# Patient Record
Sex: Male | Born: 1950
Health system: Southern US, Community
[De-identification: ages and names within clinical notes are randomized; demographics above are authoritative.]

## PROBLEM LIST (undated history)

## (undated) DIAGNOSIS — K859 Acute pancreatitis without necrosis or infection, unspecified: Secondary | ICD-10-CM

## (undated) DIAGNOSIS — K635 Polyp of colon: Secondary | ICD-10-CM

## (undated) DIAGNOSIS — N390 Urinary tract infection, site not specified: Secondary | ICD-10-CM

## (undated) DIAGNOSIS — IMO0001 Reserved for inherently not codable concepts without codable children: Secondary | ICD-10-CM

## (undated) DIAGNOSIS — K5790 Diverticulosis of intestine, part unspecified, without perforation or abscess without bleeding: Secondary | ICD-10-CM

## (undated) DIAGNOSIS — G4730 Sleep apnea, unspecified: Secondary | ICD-10-CM

## (undated) DIAGNOSIS — B9620 Unspecified Escherichia coli [E. coli] as the cause of diseases classified elsewhere: Secondary | ICD-10-CM

## (undated) DIAGNOSIS — R109 Unspecified abdominal pain: Secondary | ICD-10-CM

## (undated) HISTORY — DX: Acute pancreatitis without necrosis or infection, unspecified: K85.90

## (undated) HISTORY — DX: Reserved for inherently not codable concepts without codable children: IMO0001

## (undated) HISTORY — PX: COLONOSCOPY: SHX174

## (undated) HISTORY — PX: BACK SURGERY: SHX140

## (undated) HISTORY — DX: Polyp of colon: K63.5

## (undated) HISTORY — DX: Sleep apnea, unspecified: G47.30

## (undated) HISTORY — DX: Diverticulosis of intestine, part unspecified, without perforation or abscess without bleeding: K57.90

## (undated) HISTORY — DX: Unspecified abdominal pain: R10.9

## (undated) HISTORY — PX: HAND SURGERY: SHX662

## (undated) HISTORY — PX: KNEE SURGERY: SHX244

---

## 2004-06-13 ENCOUNTER — Emergency Department (HOSPITAL_COMMUNITY): Admission: EM | Admit: 2004-06-13 | Discharge: 2004-06-13 | Payer: Self-pay | Admitting: Emergency Medicine

## 2004-06-25 ENCOUNTER — Emergency Department (HOSPITAL_COMMUNITY): Admission: EM | Admit: 2004-06-25 | Discharge: 2004-06-25 | Payer: Self-pay | Admitting: Emergency Medicine

## 2004-06-26 ENCOUNTER — Ambulatory Visit (HOSPITAL_COMMUNITY): Admission: RE | Admit: 2004-06-26 | Discharge: 2004-06-26 | Payer: Self-pay | Admitting: Gastroenterology

## 2004-06-26 ENCOUNTER — Encounter (INDEPENDENT_AMBULATORY_CARE_PROVIDER_SITE_OTHER): Payer: Self-pay | Admitting: Specialist

## 2004-06-27 ENCOUNTER — Ambulatory Visit (HOSPITAL_COMMUNITY): Admission: RE | Admit: 2004-06-27 | Discharge: 2004-06-27 | Payer: Self-pay | Admitting: Gastroenterology

## 2004-07-01 ENCOUNTER — Encounter: Admission: RE | Admit: 2004-07-01 | Discharge: 2004-07-01 | Payer: Self-pay | Admitting: Gastroenterology

## 2004-09-12 ENCOUNTER — Encounter: Admission: RE | Admit: 2004-09-12 | Discharge: 2004-09-12 | Payer: Self-pay | Admitting: Gastroenterology

## 2007-12-20 ENCOUNTER — Emergency Department (HOSPITAL_COMMUNITY): Admission: EM | Admit: 2007-12-20 | Discharge: 2007-12-21 | Payer: Self-pay | Admitting: Emergency Medicine

## 2007-12-28 ENCOUNTER — Ambulatory Visit: Payer: Self-pay | Admitting: Gastroenterology

## 2007-12-28 ENCOUNTER — Encounter: Admission: RE | Admit: 2007-12-28 | Discharge: 2007-12-28 | Payer: Self-pay | Admitting: Gastroenterology

## 2007-12-28 LAB — CONVERTED CEMR LAB
Alkaline Phosphatase: 69 units/L (ref 39–117)
Bilirubin, Direct: 0.1 mg/dL (ref 0.0–0.3)
HCT: 48.3 % (ref 39.0–52.0)
Hemoglobin: 16.7 g/dL (ref 13.0–17.0)
Lipase: 119 units/L — ABNORMAL HIGH (ref 11.0–59.0)
Lymphocytes Relative: 29.6 % (ref 12.0–46.0)
MCHC: 34.6 g/dL (ref 30.0–36.0)
Monocytes Relative: 8.3 % (ref 3.0–11.0)
RBC: 5.24 M/uL (ref 4.22–5.81)
WBC: 10.3 10*3/uL (ref 4.5–10.5)

## 2008-01-03 ENCOUNTER — Ambulatory Visit: Payer: Self-pay | Admitting: Gastroenterology

## 2008-01-05 ENCOUNTER — Ambulatory Visit: Payer: Self-pay | Admitting: Gastroenterology

## 2008-01-05 LAB — CONVERTED CEMR LAB
Cholesterol: 246 mg/dL (ref 0–200)
Direct LDL: 172.2 mg/dL
HDL: 35.1 mg/dL — ABNORMAL LOW (ref >39.0)
Total CHOL/HDL Ratio: 7
Triglycerides: 151 mg/dL — ABNORMAL HIGH (ref 0–149)
VLDL: 30 mg/dL (ref 0–40)

## 2008-01-10 ENCOUNTER — Ambulatory Visit: Payer: Self-pay | Admitting: Cardiology

## 2008-01-18 ENCOUNTER — Ambulatory Visit: Payer: Self-pay | Admitting: Gastroenterology

## 2008-01-30 ENCOUNTER — Ambulatory Visit: Payer: Self-pay | Admitting: Gastroenterology

## 2008-01-30 ENCOUNTER — Encounter: Payer: Self-pay | Admitting: Gastroenterology

## 2008-01-30 DIAGNOSIS — K859 Acute pancreatitis without necrosis or infection, unspecified: Secondary | ICD-10-CM | POA: Insufficient documentation

## 2008-02-08 ENCOUNTER — Ambulatory Visit: Payer: Self-pay | Admitting: Gastroenterology

## 2008-02-09 ENCOUNTER — Ambulatory Visit: Payer: Self-pay | Admitting: Pulmonary Disease

## 2008-02-09 DIAGNOSIS — G4730 Sleep apnea, unspecified: Secondary | ICD-10-CM | POA: Insufficient documentation

## 2008-02-09 DIAGNOSIS — E669 Obesity, unspecified: Secondary | ICD-10-CM

## 2008-02-12 ENCOUNTER — Ambulatory Visit (HOSPITAL_BASED_OUTPATIENT_CLINIC_OR_DEPARTMENT_OTHER): Admission: RE | Admit: 2008-02-12 | Discharge: 2008-02-12 | Payer: Self-pay | Admitting: Pulmonary Disease

## 2008-02-12 ENCOUNTER — Encounter: Payer: Self-pay | Admitting: Pulmonary Disease

## 2008-02-16 ENCOUNTER — Ambulatory Visit: Payer: Self-pay | Admitting: Pulmonary Disease

## 2008-02-22 ENCOUNTER — Telehealth: Payer: Self-pay | Admitting: Pulmonary Disease

## 2008-03-01 ENCOUNTER — Ambulatory Visit: Payer: Self-pay | Admitting: Pulmonary Disease

## 2008-03-01 DIAGNOSIS — G2589 Other specified extrapyramidal and movement disorders: Secondary | ICD-10-CM

## 2008-03-08 ENCOUNTER — Telehealth (INDEPENDENT_AMBULATORY_CARE_PROVIDER_SITE_OTHER): Payer: Self-pay | Admitting: *Deleted

## 2008-03-08 ENCOUNTER — Encounter: Payer: Self-pay | Admitting: Pulmonary Disease

## 2008-10-24 ENCOUNTER — Ambulatory Visit (HOSPITAL_COMMUNITY): Admission: RE | Admit: 2008-10-24 | Discharge: 2008-10-24 | Payer: Self-pay | Admitting: *Deleted

## 2008-10-24 ENCOUNTER — Encounter (INDEPENDENT_AMBULATORY_CARE_PROVIDER_SITE_OTHER): Payer: Self-pay | Admitting: *Deleted

## 2011-01-11 ENCOUNTER — Encounter: Payer: Self-pay | Admitting: Gastroenterology

## 2011-05-05 NOTE — Op Note (Signed)
NAME:  Benjamin, Romero NO.:  [id number]   MEDICAL RECORD NO.:  [id number]          PATIENT TYPE:  AMB   LOCATION:  DAY                          FACILITY:  [facility]   PHYSICIAN:  Alfonse Ras, MD   DATE OF BIRTH:  [birth year]   DATE OF PROCEDURE:  DATE OF DISCHARGE:                               OPERATIVE REPORT   PREOPERATIVE DIAGNOSIS:  Posterior neck mass.   POSTOPERATIVE DIAGNOSIS:  Posterior neck mass.   PROCEDURE:  Excision, posterior neck mass.   ANESTHESIA:  MAC, local.   SURGEON:  Alfonse Ras, M.D.   DESCRIPTION:  After informed consent was granted for a posterior neck  mass for excision, the patient was taken to the operating room and  placed in right lateral decubitus position.  The posterior neck was  prepped and draped in normal sterile fashion.  Because of the patient's  body habitus, his neck was flexed, however, was still fairly difficult  to define subcutaneous fat from the palpable mass which was much easier  to feel in the upright position.  Using 1% lidocaine and local  anesthesia, the skin and subcutaneous tissue overlying the fundus was  anesthetized.  Using a transverse incision, I dissected down through the  skin and subcutaneous tissue onto what I felt was the slightly inflamed  mass.  This was not well encapsulated as I would have expected with a  lipoma or sebaceous cyst.  The area from this was excised with much  fatty material, involved most consistent with a lipoma.  Adequate  hemostasis of the cavity was insured.  Further palpation did not  delineate any masses.  The subcutaneous tissue was closed with a running  3-0 Vicryl.  Skin was closed with subcuticular 3-0 Monocryl.  Dermabond  dressing was placed.  The patient tolerated the procedure well went to  PACU in good condition.      Alfonse Ras, MD  Electronically Signed     KRE/MEDQ  D:  10/24/2008  T:  10/25/2008  Job:  [id number]

## 2011-05-05 NOTE — Procedures (Signed)
NAME:  Benjamin Romero, Benjamin Romero NO.:  [id number]   MEDICAL RECORD NO.:  [id number]          PATIENT TYPE:  OUT   LOCATION:  SLEEP CENTER                 FACILITY:  [facility]   PHYSICIAN:  Oretha Milch, MD      DATE OF BIRTH:  [birth year]   DATE OF STUDY:  02/12/2008                            NOCTURNAL POLYSOMNOGRAM   REFERRING PHYSICIAN:   INDICATION FOR STUDY:  Witnessed apneas by wife with loud snoring.  BMI  33.  Neck size 17 inches.  This nocturnal polysomnogram was performed  with the sleep technologist in attendance.  EEG, EOG, EMG, EKG, and  respiratory data was recorded.   EPWORTH SLEEPINESS SCORE:  1/24   MEDICATIONS:   SLEEP ARCHITECTURE:  Arousals and respiratory parameters were scored  according to data laid out by the American Academy of Sleep Medicine.  Lights out was at 10:25 p.m.  Lights on was at 3:48 a.m. after a  bathroom visit.  Total sleep time was 294.5 minutes with a sleep period  time of 310 minutes with a personal sleep efficiency of 91.3%.  Sleep  latency was 4.5 minutes and awake during sleep was 15.5 minutes.  Sleep  stages as a percentage of sleep time was N1 9.3%, N2 67.4%, N3 0%, and  REM sleep 23.3% (68.5 minutes).  He spent 95.5 minutes (32.4%) in supine  sleep and 21.5 minutes in supine REM sleep.  Good REM progression was  seen with the longest REM period around midnight.  REM latency was 56  minutes.  Arousal data; there were a total of 116 arousals of which 58  were spontaneous, 41 were associated with hypopneas, and 10 with apneas.  One arousal was associated with periodic limb movement.   RESPIRATORY DATA:  There were a total of 23 obstructive apneas, one  central apnea, 0 mixed apneas, and 94 hypopneas leading to an  apnea/hypopnea index of 24 events per hour and a respiratory disturbance  index of 32 events per hour with 39 areras.  In the supine position the  AHI was 43.3 events per hour, REM AHI was 42.9 events per hour and  REM  supine AHI was 72.6 events per hour.  The longest apnea duration was  30.1 seconds and the longest hypopnea duration was 53.7 seconds.   OXYGEN DATA:  The desaturation index was 27.3 events per hour.  The  lowest desaturation was 83%.  He spent 8.8 minutes with a saturation  less than 88%.   CARDIAC DATA:  The lowest heart rate was 34 beats per minute in REM  sleep and 35 beats per minute during non REM sleep.  The maximum heart  rate was 144 beats per minute in non REM sleep.  Occasional PVC's were  observed.   MOVEMENT-PARASOMNIA:  There were a total of 176 periodic limb movements  seen with a PLM index of 35.9 events per hour.  However, only one of  these movements was associated with an arousal.   DISCUSSION:  Moderate degree of respiratory disturbance with worsening  during supine and REM sleep.  He did not meet criteria for split study.  Frequent PLM's however  were not associated with arousals.   IMPRESSION:  1. Moderate degree of obstructive sleep apnea with hypopneas causing      sleep fragmentation and desaturations.  2. Significant periodic limb movements, however, these were not      associated with arousals.  3. No evidence of cardiac arrhythmias.   RECOMMENDATIONS:  1. The treatment options for this degree of obstructive sleep apnea      would include weight loss and continuous positive air pressure.  2. It would be interesting to see if CPAP therapy decreases the number      of periodic limb movements.  I would advise an inpatient CPAP      titration study to better observe this.      Oretha Milch, MD  Electronically Signed     RVA/MEDQ  D:  02/16/2008 18:24:31  T:  02/17/2008 12:50:20  Job:  [id number]

## 2011-05-05 NOTE — Assessment & Plan Note (Signed)
[facility]                         GASTROENTEROLOGY OFFICE NOTE   Benjamin Romero, Benjamin Romero                      MRN:          [id number]  DATE:12/28/2007                            DOB:          1951/11/06    GASTROENTEROLOGY CONSULTATION   REASON FOR CONSULTATION:  Acute pancreatitis.   HISTORY:  Benjamin Romero is a pleasant 60 year old African-American male  referred through the [facility] emergency room for evaluation.  On December 20, 2007 he was seen in the emergency room for acute abdominal pain.  He  had pain for approximately three to four days prior to that.  His work  up was pertinent for elevated amylase and lipase.  Laboratory work was  pertinent for a white count of 13.6, normal liver tests, amylase of 359  and lipase of 743.  Benjamin Romero continues to have upper epigastric pain  without radiation.  It is less than it was previously.  Symptoms began  when he was placed on doxycycline for a sinus infection.  He has been  taking Lipitor over the last couple of months.  History is noteworthy  for similar episodes of severe pain.  He was hospitalized in 2004 in [place].  He apparently underwent a full work up where no explicit  diagnosis was made.  He drinks only rarely.  An ultrasound on December  31st was unremarkable.  A CT scan showed mild distention of the  gallbladder with a small amount of pericholecystic fluid.  Ultrasound  did not demonstrate this latter finding.  He is on no other medications  or gastric irritants.   PAST MEDICAL HISTORY:  Pertinent for kidney stones and sleep apnea.   FAMILY HISTORY:  Noncontributory.   MEDICATIONS:  He currently is on no medications except for  hydromorphone.   ALLERGIES:  He has no allergies.   SOCIAL HISTORY:  He smokes 1 pack a day, he drinks rarely.  He is  married and unemployed.   REVIEW OF SYSTEMS:  Positive for urinary frequency, back pain, and  fatigue.  Positive for chronic  constipation.   PHYSICAL EXAMINATION:  VITAL SIGNS:  Pulse 80.  Blood pressure 134/82.  Weight 244.  HEENT  EOMI.  PERRLA.  Sclerae are anicteric.  Conjunctivae are pink.  NECK:  Supple without thyromegaly, adenopathy or carotid bruits.  CHEST:  Clear to auscultation and percussion without adventitious  sounds.  CARDIAC:  Regular rhythm; normal S1 S2.  There are no murmurs, gallops  or rubs.  ABDOMEN:  He has moderate tenderness in the mid epigastrium to light  palpation.  There is no guarding or rebound.  There are no abdominal  masses or organomegaly.  EXTREMITIES:  Full range of motion.  No cyanosis, clubbing or edema.  RECTAL:  Deferred.   IMPRESSION:  Acute pancreatitis.  By history, I believe he may have had  similar episodes in the past.  Etiology is unclear.  There is no current  evidence for biliary tract disease.  Alcohol is not a factor.  Microlithiasis remains a consideration.  Pancreatic divisum ought to be  ruled out.  Medications  are not contributory.  Finally, a posterior  penetrating ulcer ought to be ruled out as well.   RECOMMENDATION:  1. Repeat liver function tests, amylase and lipase.  2. MRCP.  3. If #2 is negative, I will proceed with upper endoscopy.  4. Begin Nexium 40 mg a day.     Barbette Hair. Arlyce Dice, MD,FACG  Electronically Signed    RDK/MedQ  DD: 12/28/2007  DT: 12/28/2007  Job #: [id number]

## 2011-05-05 NOTE — Assessment & Plan Note (Signed)
[facility]                         GASTROENTEROLOGY OFFICE NOTE   EULISES, KIJOWSKI                      MRN:          [id number]  DATE:02/08/2008                            DOB:          03/07/1951    PROBLEM:  Recurrent pancreatitis.  Mr. Karbowski has returned for scheduled  followup.  He has felt well since his last visit.  MRCP was unrevealing.  Upper endoscopy was normal.  Colonoscopy, there was 1 adenomatous polyp  and diffuse diverticulosis.  Mr. Stratmann claims that, he thinks certain  foods have caused his pancreatitis.  No obvious cause has been  determined.   His only medication is Lipitor 20 mg a day.   EXAM:  Pulse 80, blood pressure 120/80, weight 247.   IMPRESSION:  1. Recurrent acute pancreatitis.  He has had what sounds like 3      episodes.  The etiology is not clear.  2. Colon polyps.  3. Diverticulosis.  4. Sleep apnea.   RECOMMENDATIONS:  1. No further GI workup at this time.  I carefully instructed Mr.      Nasca to contact me if he has recurrent pain.  2. Followup colonoscopy in approximately 5 years.  3. Refer to pulmonary for sleep apnea clinic.     Barbette Hair. Arlyce Dice, MD,FACG  Electronically Signed    RDK/MedQ  DD: 02/08/2008  DT: 02/09/2008  Job #: [id number]

## 2011-05-05 NOTE — Letter (Signed)
December 28, 2007    [name]   RE:  [name], [name]  MRN:  [id number]  /  DOB:  March 10, 1951   Dear Mr. Moffatt:   It is my pleasure to have treated you recently as a new patient in my  office.  I appreciate your confidence and the opportunity to participate  in your care.   Since I do have a busy inpatient endoscopy schedule and office schedule,  my office hours vary weekly.  I am, however, available for emergency  calls every day through my office.  If I cannot promptly meet an urgent  office appointment, another one of our gastroenterologists will be able  to assist you.   My well-trained staff are prepared to help you at all times.  For  emergencies after office hours, a physician from our gastroenterology  section is always available through my 24-hour answering service.   While you are under my care, I encourage discussion of your questions  and concerns, and I will be happy to return your calls as soon as I am  available.   Once again, I welcome you as a new patient and I look forward to a happy  and healthy relationship.    Sincerely,      Barbette Hair. Arlyce Dice, MD,FACG  Electronically Signed   RDK/MedQ  DD: 12/28/2007  DT: 12/28/2007  Job #: [id number]

## 2011-05-08 NOTE — Op Note (Signed)
NAME:  Benjamin Romero, Benjamin Romero                         ACCOUNT NO.:  [id number]   MEDICAL RECORD NO.:  [id number]                   PATIENT TYPE:  AMB   LOCATION:  ENDO                                 FACILITY:  [facility]   PHYSICIAN:  Graylin Shiver, M.D.                DATE OF BIRTH:  [birth year]   DATE OF PROCEDURE:  06/26/2004  DATE OF DISCHARGE:                                 OPERATIVE REPORT   PROCEDURE PERFORMED:  Esophagogastroduodenoscopy with biopsy.   ENDOSCOPIST:  Graylin Shiver, M.D.   INDICATIONS FOR PROCEDURE:  The patient is a 60 year old male with continued  complaints of epigastric abdominal pain.  He went to the emergency room  yesterday with epigastric pain.  His liver enzymes were normal, lipase was  normal.  An abdominal ultrasound was normal.  The patient has been taking  PrevPack which he has been on recently since a prior visit to the emergency  room.  He was recently in the emergency room in [place] and then another  visit to [facility] Emergency Department with continued epigastric pain.  No  specific cause for the epigastric pain has been found.  An EGD is being done  today to further evaluate.   Informed consent was obtained after explanation of the risks of bleeding,  infection and perforation.   PREMEDICATIONS:  Fentanyl 50 mcg IV, Versed 5 mg IV.   DESCRIPTION OF PROCEDURE:  With the patient in the left lateral decubitus  position the Olympus gastroscope was inserted into the oropharynx and passed  into the esophagus.  It was advanced down the esophagus and into the stomach  and into the duodenum.  The second portion of the duodenum looked normal.  The bulb of the duodenum showed some erythema consistent with duodenitis.  The stomach showed a diffuse erythematous appearance to the mucosa  consistent with gastritis.  No ulcers, erosions, tumors or other  abnormalities were noted.  Biopsies were obtained from the distal stomach  for histological inspection and  to look for evidence of Helicobacter pylori.  No lesions were seen in the fundus or cardia on retroflexion.  The esophagus  revealed the esophagogastric junction to be at 40 cm.  It looked normal.  The esophageal mucosa looked normal.  The patient tolerated the procedure  well without complications.   IMPRESSION:  1. Gastritis, biopsies pending.  2. Duodenitis of the bulb.   PLAN:  Continue PrevPack.  Consider CT abdomen to further evaluate if pain  continues.                                               Graylin Shiver, M.D.    Germain Osgood  D:  06/26/2004  T:  06/26/2004  Job:  [id number]

## 2011-09-25 LAB — POCT I-STAT CREATININE
Creatinine, Ser: 1.1
Operator id: 270651

## 2011-09-25 LAB — CBC
HCT: 45.5
Hemoglobin: 15.6
MCHC: 34.3
MCV: 91.9
Platelets: 276
RBC: 4.94
RDW: 14.1
WBC: 13.6 — ABNORMAL HIGH

## 2011-09-25 LAB — DIFFERENTIAL
Basophils Absolute: 0
Basophils Relative: 0
Eosinophils Absolute: 0.3
Eosinophils Relative: 3
Lymphocytes Relative: 20
Lymphs Abs: 2.7
Monocytes Absolute: 1.3 — ABNORMAL HIGH
Monocytes Relative: 10
Neutro Abs: 9.2 — ABNORMAL HIGH
Neutrophils Relative %: 68

## 2011-09-25 LAB — I-STAT 8, (EC8 V) (CONVERTED LAB)
BUN: 10
Chloride: 105
HCT: 50
Hemoglobin: 17
Operator id: 270651
Potassium: 3.9
TCO2: 26
pH, Ven: 7.458 — ABNORMAL HIGH

## 2011-09-25 LAB — HEPATIC FUNCTION PANEL
Bilirubin, Direct: 0.1
Indirect Bilirubin: 0.7
Total Bilirubin: 0.8

## 2011-09-25 LAB — AMYLASE: Amylase: 359 — ABNORMAL HIGH

## 2011-09-25 LAB — LIPASE, BLOOD: Lipase: 743 — ABNORMAL HIGH

## 2011-11-03 DIAGNOSIS — K859 Acute pancreatitis without necrosis or infection, unspecified: Secondary | ICD-10-CM | POA: Insufficient documentation

## 2011-11-03 DIAGNOSIS — R109 Unspecified abdominal pain: Secondary | ICD-10-CM | POA: Insufficient documentation

## 2011-11-04 ENCOUNTER — Emergency Department (HOSPITAL_COMMUNITY): Payer: Medicare Other

## 2011-11-04 ENCOUNTER — Emergency Department (HOSPITAL_COMMUNITY)
Admission: EM | Admit: 2011-11-04 | Discharge: 2011-11-04 | Disposition: A | Discharge status: Home / Self Care | Payer: Medicare Other | Attending: Emergency Medicine | Admitting: Emergency Medicine

## 2011-11-04 ENCOUNTER — Encounter: Payer: Self-pay | Admitting: Emergency Medicine

## 2011-11-04 DIAGNOSIS — K859 Acute pancreatitis without necrosis or infection, unspecified: Secondary | ICD-10-CM

## 2011-11-04 LAB — URINALYSIS, ROUTINE W REFLEX MICROSCOPIC
Bilirubin Urine: NEGATIVE
Glucose, UA: NEGATIVE mg/dL
Ketones, ur: NEGATIVE mg/dL
Protein, ur: NEGATIVE mg/dL

## 2011-11-04 LAB — COMPREHENSIVE METABOLIC PANEL
ALT: 15 U/L (ref 0–53)
AST: 15 U/L (ref 0–37)
Alkaline Phosphatase: 72 U/L (ref 39–117)
CO2: 27 mEq/L (ref 19–32)
Chloride: 99 mEq/L (ref 96–112)
GFR calc non Af Amer: 75 mL/min — ABNORMAL LOW (ref >90)
Potassium: 4.6 mEq/L (ref 3.5–5.1)
Sodium: 136 mEq/L (ref 135–145)
Total Bilirubin: 0.4 mg/dL (ref 0.3–1.2)

## 2011-11-04 LAB — CBC
HCT: 45.7 % (ref 39.0–52.0)
MCV: 90.7 fL (ref 78.0–100.0)
RDW: 13.5 % (ref 11.5–15.5)
WBC: 12.9 10*3/uL — ABNORMAL HIGH (ref 4.0–10.5)

## 2011-11-04 LAB — DIFFERENTIAL
Basophils Absolute: 0.1 10*3/uL (ref 0.0–0.1)
Eosinophils Relative: 4 % (ref 0–5)
Lymphocytes Relative: 27 % (ref 12–46)
Monocytes Absolute: 1.1 10*3/uL — ABNORMAL HIGH (ref 0.1–1.0)

## 2011-11-04 MED ORDER — HYDROMORPHONE HCL PF 1 MG/ML IJ SOLN
0.5000 mg | Freq: Once | INTRAMUSCULAR | Status: AC
  Administered 2011-11-04: 0.5 mg via INTRAVENOUS
  Filled 2011-11-04: qty 1

## 2011-11-04 MED ORDER — PROMETHAZINE HCL 25 MG RE SUPP: 25.0000 mg | Freq: Four times a day (QID) | RECTAL | Status: AC | PRN

## 2011-11-04 MED ORDER — OXYCODONE HCL 5 MG PO TABS: ORAL_TABLET | ORAL | Status: DC

## 2011-11-04 MED ORDER — ONDANSETRON HCL 4 MG PO TABS: 4.0000 mg | ORAL_TABLET | Freq: Four times a day (QID) | ORAL | Status: AC

## 2011-11-04 MED ORDER — PROMETHAZINE HCL 25 MG PO TABS
25.0000 mg | ORAL_TABLET | Freq: Four times a day (QID) | ORAL | Status: AC | PRN
Start: 2011-11-04 — End: 2011-11-11

## 2011-11-04 NOTE — ED Notes (Signed)
Given ice chips per Dr Norlene Campbell.  Awaiting abd Ultrasound

## 2011-11-04 NOTE — ED Provider Notes (Signed)
History     CSN: [id number] Arrival date & time: 11/04/2011 12:17 AM   First MD Initiated Contact with Patient 11/04/11 0136      No chief complaint on file.   (Consider location/radiation/quality/duration/timing/severity/associated sxs/prior treatment) HPI 60 year old male presents to emergency department with complaint of 6 days of ongoing abdominal pain. Patient reports initially he had nausea vomiting and diarrhea which has resolved 4 days ago. Patient reports past history of pancreatitis 15 years ago and 4 years ago. Patient denies any alcohol use, no history of gallstone disease. Patient reports he was seen by Dr. Candelaria Stagers with GI and upper endoscopy with his previous episode of pancreatitis without known cause. Patient has had no fevers. Patient has been doing broth and Jell-O without improvement in pain. Patient was seen today at [facility] and had lab work done and was told that his white count was slightly elevated. History reviewed. No pertinent past medical history.  Past Surgical History  Procedure Date  . Back surgery   . Knee surgery     History reviewed. No pertinent family history.  History  Substance Use Topics  . Smoking status: Current Everyday Smoker -- 1.0 packs/day    Types: Cigarettes  . Smokeless tobacco: Never Used  . Alcohol Use: Yes     not often      Review of Systems  Constitutional: Negative.   HENT: Negative.   Eyes: Negative.   Respiratory: Negative.   Cardiovascular: Negative.   Gastrointestinal: Positive for abdominal pain.  Genitourinary: Negative.   Musculoskeletal: Negative.   Skin: Negative.   Neurological: Negative.   Hematological: Negative.   Psychiatric/Behavioral: Negative.   All other systems reviewed and are negative.    Allergies  Review of patient's allergies indicates no known allergies.  Home Medications   Current Outpatient Rx  Name Route Sig Dispense Refill  . ACETAMINOPHEN 325 MG PO TABS Oral Take 650 mg  by mouth every 6 (six) hours as needed. For pain     . OMEPRAZOLE 20 MG PO CPDR Oral Take 20 mg by mouth daily.        BP 160/87  Pulse 60  Temp(Src) 97.9 F (36.6 C) (Oral)  Resp 13  SpO2 99%  Physical Exam  ED Course  Procedures (including critical care time)  Labs Reviewed  COMPREHENSIVE METABOLIC PANEL - Abnormal; Notable for the following:    Glucose, Bld 115 (*)    GFR calc non Af Amer 75 (*)    GFR calc Af Amer 87 (*)    All other components within normal limits  LIPASE, BLOOD - Abnormal; Notable for the following:    Lipase 174 (*)    All other components within normal limits  URINALYSIS, ROUTINE W REFLEX MICROSCOPIC - Abnormal; Notable for the following:    Hgb urine dipstick SMALL (*)    All other components within normal limits  CBC - Abnormal; Notable for the following:    WBC 12.9 (*)    All other components within normal limits  DIFFERENTIAL - Abnormal; Notable for the following:    Neutro Abs 7.8 (*)    Monocytes Absolute 1.1 (*)    All other components within normal limits  URINE MICROSCOPIC-ADD ON    Results for orders placed during the hospital encounter of 11/04/11  COMPREHENSIVE METABOLIC PANEL      Component Value Range   Sodium 136  135 - 145 (mEq/L)   Potassium 4.6  3.5 - 5.1 (mEq/L)   Chloride 99  96 - 112 (mEq/L)   CO2 27  19 - 32 (mEq/L)   Glucose, Bld 115 (*) 70 - 99 (mg/dL)   BUN 13  6 - 23 (mg/dL)   Creatinine, Ser 4.09  0.50 - 1.35 (mg/dL)   Calcium 81.1  8.4 - 10.5 (mg/dL)   Total Protein 7.1  6.0 - 8.3 (g/dL)   Albumin 3.6  3.5 - 5.2 (g/dL)   AST 15  0 - 37 (U/L)   ALT 15  0 - 53 (U/L)   Alkaline Phosphatase 72  39 - 117 (U/L)   Total Bilirubin 0.4  0.3 - 1.2 (mg/dL)   GFR calc non Af Amer 75 (*) >90 (mL/min)   GFR calc Af Amer 87 (*) >90 (mL/min)  LIPASE, BLOOD      Component Value Range   Lipase 174 (*) 11 - 59 (U/L)  URINALYSIS, ROUTINE W REFLEX MICROSCOPIC      Component Value Range   Color, Urine YELLOW  YELLOW     Appearance CLEAR  CLEAR    Specific Gravity, Urine 1.018  1.005 - 1.030    pH 6.5  5.0 - 8.0    Glucose, UA NEGATIVE  NEGATIVE (mg/dL)   Hgb urine dipstick SMALL (*) NEGATIVE    Bilirubin Urine NEGATIVE  NEGATIVE    Ketones, ur NEGATIVE  NEGATIVE (mg/dL)   Protein, ur NEGATIVE  NEGATIVE (mg/dL)   Urobilinogen, UA 0.2  0.0 - 1.0 (mg/dL)   Nitrite NEGATIVE  NEGATIVE    Leukocytes, UA NEGATIVE  NEGATIVE   CBC      Component Value Range   WBC 12.9 (*) 4.0 - 10.5 (K/uL)   RBC 5.04  4.22 - 5.81 (MIL/uL)   Hemoglobin 16.0  13.0 - 17.0 (g/dL)   HCT 91.4  78.2 - 95.6 (%)   MCV 90.7  78.0 - 100.0 (fL)   MCH 31.7  26.0 - 34.0 (pg)   MCHC 35.0  30.0 - 36.0 (g/dL)   RDW 21.3  08.6 - 57.8 (%)   Platelets 279  150 - 400 (K/uL)  DIFFERENTIAL      Component Value Range   Neutrophils Relative 60  43 - 77 (%)   Neutro Abs 7.8 (*) 1.7 - 7.7 (K/uL)   Lymphocytes Relative 27  12 - 46 (%)   Lymphs Abs 3.5  0.7 - 4.0 (K/uL)   Monocytes Relative 9  3 - 12 (%)   Monocytes Absolute 1.1 (*) 0.1 - 1.0 (K/uL)   Eosinophils Relative 4  0 - 5 (%)   Eosinophils Absolute 0.5  0.0 - 0.7 (K/uL)   Basophils Relative 1  0 - 1 (%)   Basophils Absolute 0.1  0.0 - 0.1 (K/uL)  URINE MICROSCOPIC-ADD ON      Component Value Range   Squamous Epithelial / LPF RARE  RARE    WBC, UA 0-2  <3 (WBC/hpf)   RBC / HPF 0-2  <3 (RBC/hpf)   US Abdomen Complete  11/04/2011  *RADIOLOGY REPORT*  Clinical Data:  Abdominal pain, elevated lipase  COMPLETE ABDOMINAL ULTRASOUND  Comparison:  CT abdomen pelvis of 01/10/2008  Findings:  Gallbladder:  The gallbladder is visualized and no gallstones are noted.  Common bile duct:  The common bile duct is normal measuring 2.4 mm in diameter.  Liver:  The liver has a normal echogenic pattern.  No ductal dilatation is seen.  IVC:  Appears normal.  Pancreas:  The pancreatic head and body appear normal and the pancreatic duct  is not dilated.  The tail of the pancreas is obscured by bowel gas.   Spleen:  The spleen is normal measuring 5.6 cm sagittally.  Right Kidney:  No hydronephrosis is seen.  The right kidney measures 12.4 cm sagittally.  Left Kidney:  No hydronephrosis.  The left kidney measures 12.5 cm.  Abdominal aorta:  The abdominal aorta is within normal limits in caliber.  IMPRESSION: Negative abdominal ultrasound.  The tail the pancreas is obscured by bowel gas however.  Original Report Authenticated By: Juline Patch, M.D.      MDM  60 year old gentleman with pancreatitis. Patient does not want to be admitted at this time. Has history of same in the past. Reports no further vomiting or diarrhea. Hemodynamically stable without signs of cholelithiasis as cause of pancreatitis. Patient has a gastroenterologist he can followup with. Will send home with pain and nausea medicine with clear liquid diet and return for worsening condition        Olivia Mackie, MD 11/04/11 0730

## 2011-11-04 NOTE — ED Notes (Signed)
Taken to [place] via Doctor, general practice

## 2011-11-04 NOTE — ED Notes (Signed)
Remains asleep.  Awaiting Abd [place]

## 2011-11-04 NOTE — ED Notes (Signed)
Pt c/o upper mid abdominal pain onset 6 days ago.  Was seen by [facility] 11.13.12 and told WBC was elevated and abd xray was WNL.  .  Given prilosec without effect.  Rates abd pain  8/10 constant and sharp. Had NVD 6 days ago  But none since. Denies fever, chills, other pain, urinary symptoms.  Poor appetite on clear liquids

## 2011-11-04 NOTE — ED Notes (Signed)
Pt c/o mid abd pain onset approx 5 days ago.  Nausea without vomiting.  Had diarrhea 5 days ago, not now

## 2011-11-09 ENCOUNTER — Telehealth: Payer: Self-pay | Admitting: Gastroenterology

## 2011-11-09 NOTE — Telephone Encounter (Signed)
Left message to call back.  Pts daughter states her father had a bought with pancreatitis and was recently in the hospital. He was told to follow-up with his GI doc. Pt scheduled to see Dr. Arlyce Dice tomorrow at 2:45pm. Pts daughter aware of appt date and time.

## 2011-11-10 ENCOUNTER — Ambulatory Visit (INDEPENDENT_AMBULATORY_CARE_PROVIDER_SITE_OTHER): Payer: Medicare Other | Admitting: Gastroenterology

## 2011-11-10 ENCOUNTER — Encounter: Payer: Self-pay | Admitting: Gastroenterology

## 2011-11-10 ENCOUNTER — Ambulatory Visit (INDEPENDENT_AMBULATORY_CARE_PROVIDER_SITE_OTHER)
Admission: RE | Admit: 2011-11-10 | Discharge: 2011-11-10 | Disposition: A | Discharge status: Home / Self Care | Payer: Medicare Other | Source: Ambulatory Visit | Attending: Gastroenterology | Admitting: Gastroenterology

## 2011-11-10 ENCOUNTER — Other Ambulatory Visit (INDEPENDENT_AMBULATORY_CARE_PROVIDER_SITE_OTHER): Payer: Medicare Other

## 2011-11-10 ENCOUNTER — Other Ambulatory Visit: Payer: Self-pay | Admitting: Gastroenterology

## 2011-11-10 VITALS — BP 130/98 | HR 87 | Ht 72.0 in | Wt 226.4 lb

## 2011-11-10 DIAGNOSIS — K859 Acute pancreatitis without necrosis or infection, unspecified: Secondary | ICD-10-CM

## 2011-11-10 DIAGNOSIS — Z860100 Personal history of colon polyps, unspecified: Secondary | ICD-10-CM | POA: Insufficient documentation

## 2011-11-10 LAB — CBC WITH DIFFERENTIAL/PLATELET
Basophils Relative: 0.8 % (ref 0.0–3.0)
Eosinophils Relative: 1.7 % (ref 0.0–5.0)
HCT: 49.5 % (ref 39.0–52.0)
Lymphs Abs: 2.6 10*3/uL (ref 0.7–4.0)
MCV: 94 fl (ref 78.0–100.0)
Monocytes Absolute: 0.8 10*3/uL (ref 0.1–1.0)
Neutro Abs: 7.9 10*3/uL — ABNORMAL HIGH (ref 1.4–7.7)
RBC: 5.26 Mil/uL (ref 4.22–5.81)
WBC: 11.6 10*3/uL — ABNORMAL HIGH (ref 4.5–10.5)

## 2011-11-10 LAB — COMPREHENSIVE METABOLIC PANEL
Alkaline Phosphatase: 77 U/L (ref 39–117)
BUN: 12 mg/dL (ref 6–23)
Glucose, Bld: 95 mg/dL (ref 70–99)
Total Bilirubin: 0.7 mg/dL (ref 0.3–1.2)

## 2011-11-10 LAB — LIPID PANEL
Cholesterol: 208 mg/dL — ABNORMAL HIGH (ref 0–200)
HDL: 35.5 mg/dL — ABNORMAL LOW (ref >39.00)
VLDL: 30.2 mg/dL (ref 0.0–40.0)

## 2011-11-10 LAB — AMYLASE: Amylase: 49 U/L (ref 27–131)

## 2011-11-10 MED ORDER — IOHEXOL 300 MG/ML  SOLN
100.0000 mL | Freq: Once | INTRAMUSCULAR | Status: AC | PRN
  Administered 2011-11-10: 100 mL via INTRAVENOUS

## 2011-11-10 MED ORDER — HYDROCODONE-ACETAMINOPHEN 5-325 MG PO TABS: 2.0000 | ORAL_TABLET | Freq: Four times a day (QID) | ORAL | Status: DC | PRN

## 2011-11-10 NOTE — Assessment & Plan Note (Addendum)
Patient has chemical evidence of acute pancreatitis from one week ago. He has ongoing pain which likely is due to ongoing inflammation. Complications including pancreatic pseudocyst should be ruled out.  Etiology remains unclear though he was a heavy drinker over 30 years ago and may have chronic pancreatitis.  Recommendations #1 repeat amylase, lipase, LFTs, CBC and check IgG 4 level #2 I offered to admit the patient for parenteral analgesics but he wishes to try to continue as an outpatient for now.

## 2011-11-10 NOTE — Progress Notes (Signed)
History of Present Illness:  Benjamin Romero is a 60 year old Afro-American male self-referred for evaluation of abdominal pain. He has a history of recurrent pancreatitis. He was last seen in 2008 for symptomatic pancreatitis. Workup included comprehensive metabolic profile, CT and MRCP. Etiology was not determined. He's had prior episodes of pancreatitis before 2008. He was doing well until about 8 days ago when he developed severe midepigastric pain. He was seen at [facility] and in the ER. Abdominal ultrasound was unrevealing. Lab was pertinent for lipase 174. Despite the medication and reducing to liquids pain has continued. It is rated 5/10 with pain medicines and 8-9/10 when he is off medicines. He drank heavily until 30 years ago.  He is on no rate on medications.  GI history is also pertinent for adenomatous polyps diagnosed in 2009.   Past Medical History  Diagnosis Date  . Acute pancreatitis   . Colon polyps   . Diverticulosis   . Sleep apnea   . Pancreatitis    Past Surgical History  Procedure Date  . Back surgery   . Knee surgery    family history includes Thyroid cancer in his mother. Current Outpatient Prescriptions  Medication Sig Dispense Refill  . acetaminophen (TYLENOL) 325 MG tablet Take 650 mg by mouth every 6 (six) hours as needed. For pain       . ondansetron (ZOFRAN) 4 MG tablet Take 1 tablet (4 mg total) by mouth every 6 (six) hours.  12 tablet  0  . oxyCODONE (ROXICODONE) 5 MG immediate release tablet Take 1-2 tab po q 4 hours prn pain   20 tablet  0  . omeprazole (PRILOSEC) 20 MG capsule Take 20 mg by mouth daily.        . promethazine (PHENERGAN) 25 MG suppository Place 1 suppository (25 mg total) rectally every 6 (six) hours as needed for nausea.  12 each  0  . promethazine (PHENERGAN) 25 MG tablet Take 1 tablet (25 mg total) by mouth every 6 (six) hours as needed for nausea.  30 tablet  0   Allergies as of 11/10/2011  . (No Known Allergies)    reports that he  has been smoking Cigarettes.  He has been smoking about 1 pack per day. He has never used smokeless tobacco. He reports that he drinks alcohol. He reports that he does not use illicit drugs.     Review of Systems: Pertinent positive and negative review of systems were noted in the above HPI section. All other review of systems were otherwise negative.  Vital signs were reviewed in today's medical record Physical Exam: General: Well developed , well nourished, no acute distress Head: Normocephalic and atraumatic Eyes:  sclerae anicteric, EOMI Ears: Normal auditory acuity Mouth: No deformity or lesions Neck: Supple, no masses or thyromegaly Lungs: Clear throughout to auscultation Heart: Regular rate and rhythm; no murmurs, rubs or bruits Abdomen: Soft,  and non distended. No masses, hepatosplenomegaly or hernias noted. Normal Bowel sounds. There is moderate tenderness to palpation in the midepigastrium Rectal:deferred Musculoskeletal: Symmetrical with no gross deformities  Skin: No lesions on visible extremities Pulses:  Normal pulses noted Extremities: No clubbing, cyanosis, edema or deformities noted Neurological: Alert oriented x 4, grossly nonfocal Cervical Nodes:  No significant cervical adenopathy Inguinal Nodes: No significant inguinal adenopathy Psychological:  Alert and cooperative. Normal mood and affect

## 2011-11-10 NOTE — Patient Instructions (Signed)
  You have been scheduled for a CT scan of the abdomen and pelvis at [facility] ([address]---this is in the same building as [facility]).   You are scheduled on 11/10/2011 at 4:00. You should arrive 15 minutes prior to your appointment time for registration. Please follow the written instructions below on the day of your exam: GO THERE NOW AND DRINK YOUR 2ND BOTTLE OF CONTRAST GO TO THE BASEMENT FOR LABS FIRST  WARNING: IF YOU ARE ALLERGIC TO IODINE/X-RAY DYE, PLEASE NOTIFY RADIOLOGY IMMEDIATELY AT [phone]! YOU WILL BE GIVEN A 13 HOUR PREMEDICATION PREP.   2) You have been given 2 bottles of oral contrast to drink. The solution may taste               better if refrigerated, but do NOT add ice or any other liquid to this solution. Shake             well before drinking.    Drink 1 bottle of contrast @NOW  (2 hours prior to your exam)  Drink 1 bottle of contrast @ 3:45 (1 hour prior to your exam)  You may take any medications as prescribed with a small amount of water except for the following: Metformin, Glucophage, Glucovance, Avandamet, Riomet, Fortamet, Actoplus Met, Janumet, Glumetza or Metaglip. The above medications must be held the day of the exam AND 48 hours after the exam.  The purpose of you drinking the oral contrast is to aid in the visualization of your intestinal tract. The contrast solution may cause some diarrhea. Before your exam is started, you will be given a small amount of fluid to drink. Depending on your individual set of symptoms, you may also receive an intravenous injection of x-ray contrast/dye. Plan on being at [facility] for 30 minutes or long, depending on the type of exam you are having performed.  If you have any questions regarding your exam or if you need to reschedule, you may call the CT department at [phone] between the hours of 8:00 am and 5:00 pm,  Monday-Friday.  ________________________________________________________________________

## 2011-11-10 NOTE — Assessment & Plan Note (Signed)
Plan followup colonoscopy 2014 

## 2011-11-11 ENCOUNTER — Telehealth: Payer: Self-pay

## 2011-11-11 LAB — IGG 1, 2, 3, AND 4
IgG Subclass 2: 192 mg/dL (ref 169.0–786.0)
IgG Subclass 4: 37.7 mg/dL (ref 3.0–201.0)
IgG Total IGGSUB: 896 mg/dL (ref 650–1600)

## 2011-11-11 LAB — LDL CHOLESTEROL, DIRECT: Direct LDL: 159 mg/dL

## 2011-11-11 NOTE — Telephone Encounter (Signed)
Message copied by Michele Mcalpine on Wed Nov 11, 2011 11:02 AM ------      Message from: Melvia Heaps D      Created: Wed Nov 11, 2011 10:59 AM      Regarding: RE: CT RESULTS       Needs return OV next week.      ----- Message -----         From: Lily Lovings, RN         Sent: 11/11/2011   9:26 AM           To: Louis Meckel, MD      Subject: CT RESULTS                                               Pt had CT yesterday evening...Marland KitchenMarland KitchenResults show mild acute pancreatitis.            Please advise.

## 2011-11-11 NOTE — Progress Notes (Signed)
Quick Note:  Please schedule OV for next week. CT shows mild pancreatitis only ______

## 2011-11-11 NOTE — Telephone Encounter (Signed)
Pt scheduled to see Dr. Arlyce Dice 11/18/11@9 :30am. Pt aware of appt date and time.

## 2011-11-18 ENCOUNTER — Encounter: Payer: Self-pay | Admitting: Gastroenterology

## 2011-11-18 ENCOUNTER — Telehealth: Payer: Self-pay | Admitting: Gastroenterology

## 2011-11-18 ENCOUNTER — Ambulatory Visit (INDEPENDENT_AMBULATORY_CARE_PROVIDER_SITE_OTHER): Payer: Medicare Other | Admitting: Gastroenterology

## 2011-11-18 VITALS — BP 128/72 | HR 90 | Wt 222.6 lb

## 2011-11-18 DIAGNOSIS — K859 Acute pancreatitis without necrosis or infection, unspecified: Secondary | ICD-10-CM

## 2011-11-18 MED ORDER — PANCRELIPASE (LIP-PROT-AMYL) 25000 UNITS PO CPEP: 1.0000 | ORAL_CAPSULE | Freq: Three times a day (TID) | ORAL | Status: DC

## 2011-11-18 MED ORDER — OXYCODONE HCL 10 MG PO TB12: 10.0000 mg | ORAL_TABLET | Freq: Three times a day (TID) | ORAL | Status: DC | PRN

## 2011-11-18 MED ORDER — PANCRELIPASE (LIP-PROT-AMYL) 25000 UNITS PO CPEP: 2.0000 | ORAL_CAPSULE | Freq: Three times a day (TID) | ORAL | Status: DC

## 2011-11-18 NOTE — Telephone Encounter (Signed)
Pt called and states that he cannot afford the rx for Pancrelipase. Pt wants to know if there is something else that can be sent in for him that is less expensive.

## 2011-11-18 NOTE — Assessment & Plan Note (Addendum)
He continues to have pain. Etiology is unclear.  Recommendations #1 endoscopic ultrasound. This will help determine whether there are changes consistent with chronic pancreatitis and to rule out choledocholithiasis.  #2 trial of pancreatic enzyme supplementation

## 2011-11-18 NOTE — Progress Notes (Signed)
History of Present Illness:  Benjamin Romero has returned for followup of pancreatitis. CT scan which i reviewed,  showed mild inflammation of the pancreas. No other significant findings were seen. Amylase,  Lipase and LFTs were normal. He continues to complain of moderately severe pain, especially postprandially.    Review of Systems: Pertinent positive and negative review of systems were noted in the above HPI section. All other review of systems were otherwise negative.    Current Medications, Allergies, Past Medical History, Past Surgical History, Family History and Social History were reviewed in [employer] electronic medical record  Vital signs were reviewed in today's medical record. Physical Exam: General: Well developed , well nourished, no acute distress

## 2011-11-18 NOTE — Telephone Encounter (Signed)
Meds give him some samples for a week's worth of medication taking 2 tabs with each meal

## 2011-11-18 NOTE — Patient Instructions (Addendum)
Take  Zenpep two tablets by mouth with each meal, samples given and rx sent.  Rx for Oxycotin printed and given to you, please take to your pharmacy.

## 2011-11-19 MED ORDER — PANCRELIPASE (LIP-PROT-AMYL) 25000 UNITS PO CPEP: 2.0000 | ORAL_CAPSULE | Freq: Three times a day (TID) | ORAL | Status: DC

## 2011-11-19 NOTE — Telephone Encounter (Signed)
Samples left up front for pt to pick up. 

## 2011-11-24 ENCOUNTER — Telehealth: Payer: Self-pay

## 2011-11-24 ENCOUNTER — Telehealth: Payer: Self-pay | Admitting: Gastroenterology

## 2011-11-24 ENCOUNTER — Other Ambulatory Visit: Payer: Self-pay | Admitting: Gastroenterology

## 2011-11-24 DIAGNOSIS — K859 Acute pancreatitis without necrosis or infection, unspecified: Secondary | ICD-10-CM

## 2011-11-24 NOTE — Telephone Encounter (Signed)
Pt called and wants to know when we can schedule him for an endoscopic ultrasound. Dr. Arlyce Dice please advise.

## 2011-11-24 NOTE — Telephone Encounter (Signed)
Pt aware and has been instructed and meds reviewed.  Instructions also mailed to the pt

## 2011-11-24 NOTE — Telephone Encounter (Signed)
See alternate note  

## 2011-11-24 NOTE — Telephone Encounter (Signed)
Patty, I just sent you a note about this

## 2011-11-24 NOTE — Telephone Encounter (Signed)
Please schedule EUS with Dr. Christella Hartigan

## 2011-11-24 NOTE — Telephone Encounter (Signed)
Patty this pt needs to be scheduled for an EUS per Dr. Arlyce Dice.

## 2011-11-24 NOTE — Telephone Encounter (Signed)
Message copied by Donata Duff on Tue Nov 24, 2011 12:58 PM ------      Message from: Rachael Fee      Created: Tue Nov 24, 2011 12:48 PM       Acxel Dingee,      Can you put him on for upper EUS, radial +/- Linear for recurrent acute pancreaitits.  Needs propofol (has been on narcotics) for sedation.  Next available EUS time with propofol            Rob,      We'll get him scheduled. Thanks                  dj            ----- Message -----         From: Louis Meckel, MD         Sent: 11/24/2011  11:19 AM           To: Rob Bunting, MD, Lily Lovings, RN            Dan,      This patient has had recurrent pancreatitis of unknown etiology. I thought he EUS would  be helpful to determine whether there are  changes consistent with chronic pancreatitis (pt does not drink)  and to rule out small bile duct stones.

## 2011-11-27 ENCOUNTER — Other Ambulatory Visit: Payer: Medicare Other | Admitting: Gastroenterology

## 2011-12-09 ENCOUNTER — Telehealth: Payer: Self-pay | Admitting: Gastroenterology

## 2011-12-09 MED ORDER — HYDROCODONE-ACETAMINOPHEN 5-500 MG PO TABS: 1.0000 | ORAL_TABLET | Freq: Four times a day (QID) | ORAL | Status: DC | PRN

## 2011-12-09 NOTE — Telephone Encounter (Signed)
Pt aware and rx sent to pharmacy. 

## 2011-12-09 NOTE — Telephone Encounter (Signed)
OK for Hydrocodone 5/500, one po q6h prn, #30, NO refills

## 2011-12-09 NOTE — Telephone Encounter (Signed)
Pts wife calling stating that Dr. Arlyce Dice wrote a prescription for her husband for Oxycontin 10mg  #30 take 1 po tid on 11/18/11 for pancreatitis. The rx is going to cost them over 80.00 to fill and it is to expensive. Pts wife wants to know if we can give them a prescription for something less strong that isn't quite so expensive such as hydrocodone. Dr. Russella Dar as doc of the day please advise.

## 2011-12-31 ENCOUNTER — Ambulatory Visit (HOSPITAL_COMMUNITY)
Admission: RE | Admit: 2011-12-31 | Discharge: 2011-12-31 | Disposition: A | Discharge status: Home / Self Care | Payer: Medicare Other | Source: Ambulatory Visit | Attending: Gastroenterology | Admitting: Gastroenterology

## 2011-12-31 ENCOUNTER — Telehealth: Payer: Self-pay | Admitting: *Deleted

## 2011-12-31 ENCOUNTER — Encounter (HOSPITAL_COMMUNITY): Payer: Self-pay | Admitting: Anesthesiology

## 2011-12-31 ENCOUNTER — Encounter (HOSPITAL_COMMUNITY)
Admission: RE | Disposition: A | Discharge status: Home / Self Care | Payer: Self-pay | Source: Ambulatory Visit | Attending: Gastroenterology

## 2011-12-31 ENCOUNTER — Encounter (HOSPITAL_COMMUNITY): Payer: Self-pay | Admitting: Gastroenterology

## 2011-12-31 ENCOUNTER — Encounter (HOSPITAL_COMMUNITY): Payer: Self-pay | Admitting: *Deleted

## 2011-12-31 ENCOUNTER — Other Ambulatory Visit: Payer: Self-pay | Admitting: Gastroenterology

## 2011-12-31 ENCOUNTER — Ambulatory Visit (HOSPITAL_COMMUNITY): Payer: Medicare Other | Admitting: Anesthesiology

## 2011-12-31 DIAGNOSIS — G4730 Sleep apnea, unspecified: Secondary | ICD-10-CM | POA: Insufficient documentation

## 2011-12-31 DIAGNOSIS — K2990 Gastroduodenitis, unspecified, without bleeding: Secondary | ICD-10-CM

## 2011-12-31 DIAGNOSIS — K859 Acute pancreatitis without necrosis or infection, unspecified: Secondary | ICD-10-CM | POA: Insufficient documentation

## 2011-12-31 DIAGNOSIS — Z860100 Personal history of colon polyps, unspecified: Secondary | ICD-10-CM | POA: Insufficient documentation

## 2011-12-31 DIAGNOSIS — K2970 Gastritis, unspecified, without bleeding: Secondary | ICD-10-CM

## 2011-12-31 DIAGNOSIS — R933 Abnormal findings on diagnostic imaging of other parts of digestive tract: Secondary | ICD-10-CM

## 2011-12-31 DIAGNOSIS — K2980 Duodenitis without bleeding: Secondary | ICD-10-CM

## 2011-12-31 HISTORY — PX: EUS: SHX5427

## 2011-12-31 SURGERY — UPPER ENDOSCOPIC ULTRASOUND (EUS) LINEAR
Anesthesia: Monitor Anesthesia Care

## 2011-12-31 MED ORDER — LACTATED RINGERS IV SOLN
INTRAVENOUS | Status: DC
  Administered 2011-12-31: 1000 mL via INTRAVENOUS

## 2011-12-31 MED ORDER — PROPOFOL 10 MG/ML IV EMUL
INTRAVENOUS | Status: DC | PRN
  Administered 2011-12-31: 140 ug/kg/min via INTRAVENOUS

## 2011-12-31 MED ORDER — PROMETHAZINE HCL 25 MG/ML IJ SOLN: 6.2500 mg | INTRAMUSCULAR | Status: DC | PRN

## 2011-12-31 MED ORDER — BUTAMBEN-TETRACAINE-BENZOCAINE 2-2-14 % EX AERO
INHALATION_SPRAY | CUTANEOUS | Status: DC | PRN
  Administered 2011-12-31: 2 via TOPICAL

## 2011-12-31 MED ORDER — FENTANYL CITRATE 0.05 MG/ML IJ SOLN
INTRAMUSCULAR | Status: DC | PRN
  Administered 2011-12-31 (×2): 50 ug via INTRAVENOUS

## 2011-12-31 MED ORDER — MIDAZOLAM HCL 5 MG/5ML IJ SOLN
INTRAMUSCULAR | Status: DC | PRN
  Administered 2011-12-31 (×2): 1 mg via INTRAVENOUS

## 2011-12-31 MED ORDER — FENTANYL CITRATE 0.05 MG/ML IJ SOLN: 25.0000 ug | INTRAMUSCULAR | Status: DC | PRN

## 2011-12-31 NOTE — Telephone Encounter (Signed)
Message copied by Marlowe Kays on Thu Dec 31, 2011 11:21 AM ------      Message from: Melvia Heaps D      Created: Thu Dec 31, 2011  9:51 AM       The patient needs an office visit

## 2011-12-31 NOTE — Anesthesia Preprocedure Evaluation (Signed)
Anesthesia Evaluation  Patient identified by MRN, date of birth, ID band Patient awake    Reviewed: Allergy & Precautions, H&P , NPO status , Patient's Chart, lab work & pertinent test results  Airway Mallampati: II TM Distance: >3 FB Neck ROM: full    Dental No notable dental hx. (+) Chipped and Dental Advidsory Given   Pulmonary neg pulmonary ROS, sleep apnea (Noncompliant with CPAP) , former smoker (1 1/2ppd; discontinued 12/22/11) clear to auscultation  Pulmonary exam normal       Cardiovascular Exercise Tolerance: Good neg cardio ROS regular Normal    Neuro/Psych Negative Neurological ROS  Negative Psych ROS   GI/Hepatic negative GI ROS, Neg liver ROS,   Endo/Other  Negative Endocrine ROS  Renal/GU negative Renal ROS  Genitourinary negative   Musculoskeletal   Abdominal   Peds  Hematology negative hematology ROS (+)   Anesthesia Other Findings   Reproductive/Obstetrics negative OB ROS                           Anesthesia Physical Anesthesia Plan  ASA: II  Anesthesia Plan:    Post-op Pain Management:    Induction:   Airway Management Planned:   Additional Equipment:   Intra-op Plan:   Post-operative Plan:   Informed Consent: I have reviewed the patients History and Physical, chart, labs and discussed the procedure including the risks, benefits and alternatives for the proposed anesthesia with the patient or authorized representative who has indicated his/her understanding and acceptance.   Dental Advisory Given  Plan Discussed with: CRNA  Anesthesia Plan Comments:         Anesthesia Quick Evaluation

## 2011-12-31 NOTE — Op Note (Addendum)
[facility] [address] [place], [place]  [address]  ENDOSCOPIC ULTRASOUND PROCEDURE REPORT  PATIENT:  Benjamin Benjamin Romero, Benjamin Romero  MR#:  [id number] BIRTHDATE:  [birth year]  GENDER:  male  ENDOSCOPIST:  Rachael Fee, MD REFERRED BY:  Barbette Hair. Arlyce Dice, M.D.  PROCEDURE DATE:  12/31/2011 PROCEDURE:  Upper EUS ASA CLASS:  Class II INDICATIONS:  recurrent acute pancreatitis (20 years ago in [place], 5-6 years ago in [place], 2-3 months ago [place])  MEDICATIONS:   MAC sedation, administered by CRNA  DESCRIPTION OF PROCEDURE:   After the risks benefits and alternatives of the procedure were  explained, informed consent was obtained. The patient was then placed in the left, lateral, decubitus postion and IV sedation was administered. Throughout the procedure, the patient's blood pressure, pulse and oxygen saturations were monitored continuously.  Under direct visualization, the  endoscope was introduced through the mouth and advanced to the second portion of the duodenum.  Water was used as necessary to provide an acoustic interface.  Upon completion of the imaging, water was removed and the patient was sent to the recovery room in satisfactory condition.  Several endoscopic and ultrasonographic pictures were taken but were lost due to technical difficulty in endoscopy  Endoscopic findings: 1. Normal esophagus 2. Mild, non-specific gastritis.  This was biopsied with forceps following EUS examination given his history of H. pylori 3. Duodentitis (several small bulbar erosions)  EUS findings: 1. Pancreatic parenchyma was abnormal throughout: there were hyperechoc strands, overall lobular parnenchyma that are suggestive of chronic pancreatitis. 2. Normal, non-dilated main pancreatitic duct 3. No peripancreatic adenopathy 4. CBD was normal, non-dilated and without stones 5. Gallbladder was normal.  Impression: Parenchymal findings that are suggestive but not clearly diagnostic of  chronic pancreatitis.  Should consider elective cholecystectomy on chance that microtlithiasis has caused his 2-3 episodes of acute pancreatitis (other workup has been negative and he does not drink alcohol). There was gastritis  and duodenitis and given his history of H. pylori I biopsied the stomach to check for recurrent or residual bacteria  ______________________________ Rachael Fee, MD  n. REVISED:  12/31/2011 09:39 AM eSIGNED:   Rachael Fee at 12/31/2011 09:39 AM  [name], [phone]

## 2011-12-31 NOTE — H&P (Signed)
  HPI: This is a man with history of recurrent pancreatitis of unclear etiology although his used to drink a good amount of etoh.    Past Medical History  Diagnosis Date  . Acute pancreatitis   . Colon polyps   . Diverticulosis   . Sleep apnea   . Pancreatitis     Past Surgical History  Procedure Date  . Back surgery   . Knee surgery     No current facility-administered medications for this encounter.    Allergies as of 11/24/2011  . (No Known Allergies)    Family History  Problem Relation Age of Onset  . Thyroid cancer Mother     History   Social History  . Marital Status: Married    Spouse Name: N/A    Number of Children: 2  . Years of Education: N/A   Occupational History  . Not on file.   Social History Main Topics  . Smoking status: Former Smoker -- 1.0 packs/day    Types: Cigarettes    Quit date: 12/22/2011  . Smokeless tobacco: Never Used  . Alcohol Use: Yes     not often  . Drug Use: No  . Sexually Active:    Other Topics Concern  . Not on file   Social History Narrative  . No narrative on file      Physical Exam: BP 141/92  Temp(Src) 97.7 F (36.5 C) (Oral)  Resp 12  Ht 6' (1.829 m)  Wt 105.235 kg (232 lb)  BMI 31.47 kg/m2  SpO2 100% Constitutional: generally well-appearing Psychiatric: alert and oriented x3 Abdomen: soft, nontender, nondistended, no obvious ascites, no peritoneal signs, normal bowel sounds     Assessment and plan: 61 y.o. male with recurrent pancreatitis  For upper EUS today

## 2011-12-31 NOTE — Transfer of Care (Signed)
Immediate Anesthesia Transfer of Care Note  Patient: Benjamin Romero  Procedure(s) Performed:  UPPER ENDOSCOPIC ULTRASOUND (EUS) LINEAR - radial linear  Patient Location: PACU  Anesthesia Type: MAC  Level of Consciousness: awake, sedated and responds to stimulation  Airway & Oxygen Therapy: Patient Spontanous Breathing and Patient connected to nasal cannula oxygen  Post-op Assessment: Report given to PACU RN and Post -op Vital signs reviewed and stable  Post vital signs: stable  Complications: No apparent anesthesia complications

## 2012-01-01 ENCOUNTER — Encounter (HOSPITAL_COMMUNITY): Payer: Self-pay | Admitting: Gastroenterology

## 2012-01-01 NOTE — Anesthesia Postprocedure Evaluation (Signed)
Anesthesia Post Note  Patient: Benjamin Romero  Procedure(s) Performed:  UPPER ENDOSCOPIC ULTRASOUND (EUS) LINEAR - radial linear  Anesthesia type: MAC  Patient location: PACU  Post pain: Pain level controlled  Post assessment: Post-op Vital signs reviewed  Last Vitals:  Filed Vitals:   12/31/11 0937  BP: 144/90  Temp:   Resp: 27    Post vital signs: Reviewed  Level of consciousness: sedated  Complications: No apparent anesthesia complications

## 2012-01-05 NOTE — Telephone Encounter (Signed)
Scheduled pt for tomorrow 1/16 to follow up with Dr Arlyce Dice

## 2012-01-06 ENCOUNTER — Encounter: Payer: Self-pay | Admitting: Gastroenterology

## 2012-01-06 ENCOUNTER — Ambulatory Visit (INDEPENDENT_AMBULATORY_CARE_PROVIDER_SITE_OTHER): Payer: Medicare Other | Admitting: Gastroenterology

## 2012-01-06 DIAGNOSIS — K859 Acute pancreatitis without necrosis or infection, unspecified: Secondary | ICD-10-CM

## 2012-01-06 DIAGNOSIS — Z86010 Personal history of colonic polyps: Secondary | ICD-10-CM

## 2012-01-06 NOTE — Assessment & Plan Note (Signed)
Plan followup colonoscopy 

## 2012-01-06 NOTE — Progress Notes (Signed)
History of Present Illness:  Mr. Benjamin Romero is returned for followup of his abdominal pain. He continues to have intermittent midepigastric pain.  EUS demonstrated changes in the pancreas consistent with chronic pancreatitis. No gallbladder stones were seen.    Review of Systems: Pertinent positive and negative review of systems were noted in the above HPI section. All other review of systems were otherwise negative.    Current Medications, Allergies, Past Medical History, Past Surgical History, Family History and Social History were reviewed in [employer] electronic medical record  Vital signs were reviewed in today's medical record. Physical Exam: General: Well developed , well nourished, no acute distress

## 2012-01-06 NOTE — Patient Instructions (Signed)
You will need to call back to schedule your Colonoscopy recommended by Dr Lindell Spar will be scheduled with a nurse for a PreVisit at that time

## 2012-01-06 NOTE — Assessment & Plan Note (Signed)
Etiology has not been determined. He does have changes in the pancreas consistent with mild chronic pancreatitis and certainly can be a source for recurrent pain.  Recommendations #1 consider elective cholecystectomy on the possibility that he has microlithiasis. The patient wishes to take this under advisement.

## 2012-01-07 ENCOUNTER — Encounter: Payer: Self-pay | Admitting: Gastroenterology

## 2012-08-03 ENCOUNTER — Other Ambulatory Visit (HOSPITAL_COMMUNITY): Payer: Self-pay | Admitting: Orthopaedic Surgery

## 2012-08-12 ENCOUNTER — Encounter (HOSPITAL_COMMUNITY): Payer: Self-pay | Admitting: Pharmacy Technician

## 2012-08-19 ENCOUNTER — Inpatient Hospital Stay (HOSPITAL_COMMUNITY): Admission: RE | Admit: 2012-08-19 | Payer: Medicare Other | Source: Ambulatory Visit

## 2012-08-26 ENCOUNTER — Encounter (HOSPITAL_COMMUNITY): Admission: RE | Payer: Self-pay | Source: Ambulatory Visit

## 2012-08-26 ENCOUNTER — Inpatient Hospital Stay (HOSPITAL_COMMUNITY): Admission: RE | Admit: 2012-08-26 | Payer: Medicare Other | Source: Ambulatory Visit | Admitting: Orthopaedic Surgery

## 2012-08-26 SURGERY — ARTHROPLASTY, KNEE, TOTAL
Anesthesia: Choice | Laterality: Left

## 2012-09-26 ENCOUNTER — Other Ambulatory Visit (HOSPITAL_COMMUNITY): Payer: Self-pay | Admitting: Orthopaedic Surgery

## 2012-10-31 ENCOUNTER — Encounter (HOSPITAL_COMMUNITY): Payer: Self-pay

## 2012-11-02 ENCOUNTER — Encounter (HOSPITAL_COMMUNITY): Payer: Self-pay

## 2012-11-02 ENCOUNTER — Encounter (HOSPITAL_COMMUNITY)
Admission: RE | Admit: 2012-11-02 | Discharge: 2012-11-02 | Disposition: A | Discharge status: Home / Self Care | Payer: Medicare Other | Source: Ambulatory Visit | Attending: Orthopaedic Surgery | Admitting: Orthopaedic Surgery

## 2012-11-02 ENCOUNTER — Other Ambulatory Visit (HOSPITAL_COMMUNITY): Payer: Self-pay | Admitting: Orthopaedic Surgery

## 2012-11-02 DIAGNOSIS — B9620 Unspecified Escherichia coli [E. coli] as the cause of diseases classified elsewhere: Secondary | ICD-10-CM

## 2012-11-02 DIAGNOSIS — N390 Urinary tract infection, site not specified: Secondary | ICD-10-CM

## 2012-11-02 HISTORY — DX: Urinary tract infection, site not specified: N39.0

## 2012-11-02 HISTORY — DX: Unspecified Escherichia coli (E. coli) as the cause of diseases classified elsewhere: B96.20

## 2012-11-02 LAB — ABO/RH: ABO/RH(D): B POS

## 2012-11-02 LAB — URINALYSIS, ROUTINE W REFLEX MICROSCOPIC
Bilirubin Urine: NEGATIVE
Ketones, ur: NEGATIVE mg/dL
Nitrite: NEGATIVE
Specific Gravity, Urine: 1.016 (ref 1.005–1.030)
pH: 6 (ref 5.0–8.0)

## 2012-11-02 LAB — CBC
HCT: 45.8 % (ref 39.0–52.0)
MCH: 31.1 pg (ref 26.0–34.0)
MCV: 88.4 fL (ref 78.0–100.0)
Platelets: 223 10*3/uL (ref 150–400)
RBC: 5.18 MIL/uL (ref 4.22–5.81)
RDW: 14.6 % (ref 11.5–15.5)
WBC: 11 10*3/uL — ABNORMAL HIGH (ref 4.0–10.5)

## 2012-11-02 LAB — BASIC METABOLIC PANEL
CO2: 28 mEq/L (ref 19–32)
Calcium: 9.4 mg/dL (ref 8.4–10.5)
Chloride: 103 mEq/L (ref 96–112)
Creatinine, Ser: 1.01 mg/dL (ref 0.50–1.35)
Glucose, Bld: 77 mg/dL (ref 70–99)

## 2012-11-02 LAB — URINE MICROSCOPIC-ADD ON

## 2012-11-02 LAB — PROTIME-INR: INR: 0.91 (ref 0.00–1.49)

## 2012-11-02 LAB — TYPE AND SCREEN

## 2012-11-02 NOTE — Patient Instructions (Addendum)
20 [name]  11/02/2012   Your procedure is scheduled on:  11-14 -2013  Report to [facility] at      0530  AM.  Call this number if you have problems the morning of surgery: [phone]  Or Presurgical Testing [phone](Aadyn Buchheit)   Remember:   Do not eat food:After Midnight.    Take these medicines the morning of surgery with A SIP OF WATER: none   Do not wear jewelry, make-up or nail polish.  Do not wear lotions, powders, or perfumes. You may wear deodorant.  Do not shave 48 hours prior to surgery.(face and neck okay, no shaving of legs)  Do not bring valuables to the hospital.  Contacts, dentures or bridgework may not be worn into surgery.  Leave suitcase in the car. After surgery it may be brought to your room.  For patients admitted to the hospital, checkout time is 11:00 AM the day of discharge.   Patients discharged the day of surgery will not be allowed to drive home. Must have responsible person with you x 24 hours once discharged.  Name and phone number of your driver: Marylynn -spouse [phone]  Special Instructions: CHG Shower Use Special Wash: see special instruction sheet.(avoid face and genitals)   Please read over the following fact sheets that you were given: MRSA Information, Blood Transfusion fact sheet, Incentive Spirometry Instruction.

## 2012-11-03 ENCOUNTER — Inpatient Hospital Stay (HOSPITAL_COMMUNITY): Payer: Medicare Other | Admitting: *Deleted

## 2012-11-03 ENCOUNTER — Encounter (HOSPITAL_COMMUNITY): Payer: Self-pay | Admitting: *Deleted

## 2012-11-03 ENCOUNTER — Inpatient Hospital Stay (HOSPITAL_COMMUNITY)
Admission: RE | Admit: 2012-11-03 | Discharge: 2012-11-05 | DRG: 470 | Disposition: A | Discharge status: Home Health | Payer: Medicare Other | Source: Ambulatory Visit | Attending: Orthopaedic Surgery | Admitting: Orthopaedic Surgery

## 2012-11-03 ENCOUNTER — Encounter (HOSPITAL_COMMUNITY)
Admission: RE | Disposition: A | Discharge status: Home Health | Payer: Self-pay | Source: Ambulatory Visit | Attending: Orthopaedic Surgery

## 2012-11-03 ENCOUNTER — Inpatient Hospital Stay (HOSPITAL_COMMUNITY): Payer: Medicare Other

## 2012-11-03 DIAGNOSIS — G4730 Sleep apnea, unspecified: Secondary | ICD-10-CM | POA: Diagnosis present

## 2012-11-03 DIAGNOSIS — E669 Obesity, unspecified: Secondary | ICD-10-CM | POA: Diagnosis present

## 2012-11-03 DIAGNOSIS — Z01812 Encounter for preprocedural laboratory examination: Secondary | ICD-10-CM

## 2012-11-03 DIAGNOSIS — M1710 Unilateral primary osteoarthritis, unspecified knee: Principal | ICD-10-CM | POA: Diagnosis present

## 2012-11-03 DIAGNOSIS — M1712 Unilateral primary osteoarthritis, left knee: Secondary | ICD-10-CM

## 2012-11-03 HISTORY — PX: TOTAL KNEE ARTHROPLASTY: SHX125

## 2012-11-03 SURGERY — ARTHROPLASTY, KNEE, TOTAL
Anesthesia: Spinal | Site: Knee | Laterality: Left | Wound class: Clean

## 2012-11-03 MED ORDER — B COMPLEX-C PO TABS
1.0000 | ORAL_TABLET | Freq: Every day | ORAL | Status: DC
  Administered 2012-11-03 – 2012-11-05 (×3): 1 via ORAL
  Filled 2012-11-03 (×3): qty 1

## 2012-11-03 MED ORDER — SODIUM CHLORIDE 0.9 % IR SOLN
Status: DC | PRN
  Administered 2012-11-03: 1000 mL

## 2012-11-03 MED ORDER — ZOLPIDEM TARTRATE 5 MG PO TABS: 5.0000 mg | ORAL_TABLET | Freq: Every evening | ORAL | Status: DC | PRN

## 2012-11-03 MED ORDER — PHENOL 1.4 % MT LIQD
1.0000 | OROMUCOSAL | Status: DC | PRN
  Filled 2012-11-03: qty 177

## 2012-11-03 MED ORDER — ACETAMINOPHEN 325 MG PO TABS: 650.0000 mg | ORAL_TABLET | Freq: Four times a day (QID) | ORAL | Status: DC | PRN

## 2012-11-03 MED ORDER — OXYCODONE HCL ER 20 MG PO T12A
20.0000 mg | EXTENDED_RELEASE_TABLET | Freq: Two times a day (BID) | ORAL | Status: DC
  Administered 2012-11-03 – 2012-11-05 (×4): 20 mg via ORAL
  Filled 2012-11-03 (×4): qty 1

## 2012-11-03 MED ORDER — PROMETHAZINE HCL 25 MG/ML IJ SOLN: 6.2500 mg | INTRAMUSCULAR | Status: DC | PRN

## 2012-11-03 MED ORDER — DIPHENHYDRAMINE HCL 12.5 MG/5ML PO ELIX: 12.5000 mg | ORAL_SOLUTION | ORAL | Status: DC | PRN

## 2012-11-03 MED ORDER — HYDROMORPHONE HCL PF 1 MG/ML IJ SOLN
INTRAMUSCULAR | Status: AC
  Filled 2012-11-03: qty 1

## 2012-11-03 MED ORDER — KETOROLAC TROMETHAMINE 15 MG/ML IJ SOLN
15.0000 mg | Freq: Four times a day (QID) | INTRAMUSCULAR | Status: AC
  Administered 2012-11-03 – 2012-11-04 (×3): 15 mg via INTRAVENOUS
  Filled 2012-11-03 (×2): qty 1

## 2012-11-03 MED ORDER — MECLIZINE HCL 25 MG PO TABS
25.0000 mg | ORAL_TABLET | Freq: Two times a day (BID) | ORAL | Status: DC | PRN
  Filled 2012-11-03: qty 1

## 2012-11-03 MED ORDER — LACTATED RINGERS IV SOLN
INTRAVENOUS | Status: DC | PRN
  Administered 2012-11-03 (×3): via INTRAVENOUS

## 2012-11-03 MED ORDER — RIVAROXABAN 10 MG PO TABS
10.0000 mg | ORAL_TABLET | Freq: Every day | ORAL | Status: DC
  Administered 2012-11-04 – 2012-11-05 (×2): 10 mg via ORAL
  Filled 2012-11-03 (×3): qty 1

## 2012-11-03 MED ORDER — CEFAZOLIN SODIUM-DEXTROSE 2-3 GM-% IV SOLR
2.0000 g | Freq: Four times a day (QID) | INTRAVENOUS | Status: AC
  Administered 2012-11-03 (×2): 2 g via INTRAVENOUS
  Filled 2012-11-03 (×2): qty 50

## 2012-11-03 MED ORDER — HYDROMORPHONE HCL PF 1 MG/ML IJ SOLN
1.0000 mg | INTRAMUSCULAR | Status: DC | PRN
  Administered 2012-11-03 – 2012-11-04 (×2): 1 mg via INTRAVENOUS
  Filled 2012-11-03: qty 1

## 2012-11-03 MED ORDER — ONDANSETRON HCL 4 MG/2ML IJ SOLN
INTRAMUSCULAR | Status: DC | PRN
  Administered 2012-11-03: 4 mg via INTRAVENOUS

## 2012-11-03 MED ORDER — DOCUSATE SODIUM 100 MG PO CAPS
100.0000 mg | ORAL_CAPSULE | Freq: Two times a day (BID) | ORAL | Status: DC
  Administered 2012-11-03 – 2012-11-05 (×4): 100 mg via ORAL

## 2012-11-03 MED ORDER — ACETAMINOPHEN 650 MG RE SUPP: 650.0000 mg | Freq: Four times a day (QID) | RECTAL | Status: DC | PRN

## 2012-11-03 MED ORDER — EPHEDRINE SULFATE 50 MG/ML IJ SOLN
INTRAMUSCULAR | Status: DC | PRN
  Administered 2012-11-03 (×2): 5 mg via INTRAVENOUS

## 2012-11-03 MED ORDER — ONDANSETRON HCL 4 MG/2ML IJ SOLN: 4.0000 mg | Freq: Four times a day (QID) | INTRAMUSCULAR | Status: DC | PRN

## 2012-11-03 MED ORDER — MEPERIDINE HCL 50 MG/ML IJ SOLN: 6.2500 mg | INTRAMUSCULAR | Status: DC | PRN

## 2012-11-03 MED ORDER — INFLUENZA VIRUS VACC SPLIT PF IM SUSP
0.5000 mL | INTRAMUSCULAR | Status: AC
  Filled 2012-11-03: qty 0.5

## 2012-11-03 MED ORDER — METHOCARBAMOL 100 MG/ML IJ SOLN
500.0000 mg | Freq: Four times a day (QID) | INTRAVENOUS | Status: DC | PRN
  Administered 2012-11-03: 500 mg via INTRAVENOUS
  Filled 2012-11-03: qty 5

## 2012-11-03 MED ORDER — ACETAMINOPHEN 10 MG/ML IV SOLN: 1000.0000 mg | Freq: Once | INTRAVENOUS | Status: DC | PRN

## 2012-11-03 MED ORDER — MENTHOL 3 MG MT LOZG
1.0000 | LOZENGE | OROMUCOSAL | Status: DC | PRN
  Filled 2012-11-03: qty 9

## 2012-11-03 MED ORDER — CEFAZOLIN SODIUM 1-5 GM-% IV SOLN
INTRAVENOUS | Status: AC
  Filled 2012-11-03: qty 50

## 2012-11-03 MED ORDER — DEXTROSE 5 % IV SOLN
3.0000 g | INTRAVENOUS | Status: AC
  Administered 2012-11-03: 3 g via INTRAVENOUS
  Filled 2012-11-03: qty 3000

## 2012-11-03 MED ORDER — ALUM & MAG HYDROXIDE-SIMETH 200-200-20 MG/5ML PO SUSP
30.0000 mL | ORAL | Status: DC | PRN
  Administered 2012-11-04: 30 mL via ORAL
  Filled 2012-11-03: qty 30

## 2012-11-03 MED ORDER — FERROUS SULFATE 325 (65 FE) MG PO TABS
325.0000 mg | ORAL_TABLET | Freq: Three times a day (TID) | ORAL | Status: DC
  Administered 2012-11-03 – 2012-11-05 (×5): 325 mg via ORAL
  Filled 2012-11-03 (×9): qty 1

## 2012-11-03 MED ORDER — ACETAMINOPHEN 10 MG/ML IV SOLN
INTRAVENOUS | Status: AC
  Filled 2012-11-03: qty 100

## 2012-11-03 MED ORDER — LACTATED RINGERS IV SOLN: INTRAVENOUS | Status: DC

## 2012-11-03 MED ORDER — METHOCARBAMOL 500 MG PO TABS
500.0000 mg | ORAL_TABLET | Freq: Four times a day (QID) | ORAL | Status: DC | PRN
  Administered 2012-11-03 – 2012-11-05 (×5): 500 mg via ORAL
  Filled 2012-11-03 (×5): qty 1

## 2012-11-03 MED ORDER — KETOROLAC TROMETHAMINE 15 MG/ML IJ SOLN
INTRAMUSCULAR | Status: AC
  Filled 2012-11-03: qty 1

## 2012-11-03 MED ORDER — SODIUM CHLORIDE 0.9 % IR SOLN
Status: DC | PRN
  Administered 2012-11-03: 3000 mL

## 2012-11-03 MED ORDER — ATORVASTATIN CALCIUM 10 MG PO TABS
10.0000 mg | ORAL_TABLET | Freq: Every day | ORAL | Status: DC
  Administered 2012-11-04 – 2012-11-05 (×2): 10 mg via ORAL
  Filled 2012-11-03 (×4): qty 1

## 2012-11-03 MED ORDER — OXYCODONE HCL 5 MG PO TABS
5.0000 mg | ORAL_TABLET | ORAL | Status: DC | PRN
  Administered 2012-11-03 (×2): 10 mg via ORAL
  Administered 2012-11-04: 5 mg via ORAL
  Administered 2012-11-04 – 2012-11-05 (×6): 10 mg via ORAL
  Filled 2012-11-03 (×2): qty 2
  Filled 2012-11-03: qty 1
  Filled 2012-11-03 (×7): qty 2

## 2012-11-03 MED ORDER — ONDANSETRON HCL 4 MG PO TABS: 4.0000 mg | ORAL_TABLET | Freq: Four times a day (QID) | ORAL | Status: DC | PRN

## 2012-11-03 MED ORDER — BUPIVACAINE IN DEXTROSE 0.75-8.25 % IT SOLN
INTRATHECAL | Status: DC | PRN
  Administered 2012-11-03: 1.8 mL via INTRATHECAL

## 2012-11-03 MED ORDER — OXYCODONE HCL 5 MG/5ML PO SOLN
5.0000 mg | Freq: Once | ORAL | Status: DC | PRN
  Filled 2012-11-03: qty 5

## 2012-11-03 MED ORDER — PNEUMOCOCCAL VAC POLYVALENT 25 MCG/0.5ML IJ INJ
0.5000 mL | INJECTION | INTRAMUSCULAR | Status: AC
  Filled 2012-11-03: qty 0.5

## 2012-11-03 MED ORDER — FENTANYL CITRATE 0.05 MG/ML IJ SOLN
INTRAMUSCULAR | Status: DC | PRN
  Administered 2012-11-03: 100 ug via INTRAVENOUS

## 2012-11-03 MED ORDER — ACETAMINOPHEN 10 MG/ML IV SOLN
INTRAVENOUS | Status: DC | PRN
  Administered 2012-11-03: 1000 mg via INTRAVENOUS

## 2012-11-03 MED ORDER — HYDROMORPHONE HCL PF 1 MG/ML IJ SOLN
0.2500 mg | INTRAMUSCULAR | Status: DC | PRN
  Administered 2012-11-03 (×4): 0.5 mg via INTRAVENOUS

## 2012-11-03 MED ORDER — METOCLOPRAMIDE HCL 5 MG/ML IJ SOLN: 5.0000 mg | Freq: Three times a day (TID) | INTRAMUSCULAR | Status: DC | PRN

## 2012-11-03 MED ORDER — PROPOFOL 10 MG/ML IV EMUL
INTRAVENOUS | Status: DC | PRN
  Administered 2012-11-03: 50 ug/kg/min via INTRAVENOUS

## 2012-11-03 MED ORDER — CEFAZOLIN SODIUM-DEXTROSE 2-3 GM-% IV SOLR
INTRAVENOUS | Status: AC
  Filled 2012-11-03: qty 50

## 2012-11-03 MED ORDER — MIDAZOLAM HCL 5 MG/5ML IJ SOLN
INTRAMUSCULAR | Status: DC | PRN
  Administered 2012-11-03: 1 mg via INTRAVENOUS

## 2012-11-03 MED ORDER — OXYCODONE HCL 5 MG PO TABS: 5.0000 mg | ORAL_TABLET | Freq: Once | ORAL | Status: DC | PRN

## 2012-11-03 MED ORDER — SODIUM CHLORIDE 0.9 % IV SOLN
INTRAVENOUS | Status: DC
  Administered 2012-11-03 – 2012-11-05 (×3): via INTRAVENOUS

## 2012-11-03 MED ORDER — METOCLOPRAMIDE HCL 10 MG PO TABS: 5.0000 mg | ORAL_TABLET | Freq: Three times a day (TID) | ORAL | Status: DC | PRN

## 2012-11-03 SURGICAL SUPPLY — 59 items
Assymetric Patella ×2 IMPLANT
BAG ZIPLOCK 12X15 (MISCELLANEOUS) ×2 IMPLANT
BANDAGE ELASTIC 6 VELCRO ST LF (GAUZE/BANDAGES/DRESSINGS) ×2 IMPLANT
BANDAGE ESMARK 6X9 LF (GAUZE/BANDAGES/DRESSINGS) ×1 IMPLANT
BLADE SAG 18X100X1.27 (BLADE) ×2 IMPLANT
BLADE SAW SGTL 13.0X1.19X90.0M (BLADE) ×2 IMPLANT
BNDG ESMARK 6X9 LF (GAUZE/BANDAGES/DRESSINGS) ×2
BOWL SMART MIX CTS (DISPOSABLE) ×2 IMPLANT
CEMENT BONE 1-PACK (Cement) ×4 IMPLANT
CLOTH BEACON ORANGE TIMEOUT ST (SAFETY) ×2 IMPLANT
CUFF TOURN SGL QUICK 34 (TOURNIQUET CUFF) ×1
CUFF TRNQT CYL 34X4X40X1 (TOURNIQUET CUFF) ×1 IMPLANT
DRAPE EXTREMITY T 121X128X90 (DRAPE) ×2 IMPLANT
DRAPE LG THREE QUARTER DISP (DRAPES) ×2 IMPLANT
DRAPE POUCH INSTRU U-SHP 10X18 (DRAPES) ×2 IMPLANT
DRAPE U-SHAPE 47X51 STRL (DRAPES) ×2 IMPLANT
DRSG PAD ABDOMINAL 8X10 ST (GAUZE/BANDAGES/DRESSINGS) ×2 IMPLANT
DURAPREP 26ML APPLICATOR (WOUND CARE) ×2 IMPLANT
ELECT REM PT RETURN 9FT ADLT (ELECTROSURGICAL) ×2
ELECTRODE REM PT RTRN 9FT ADLT (ELECTROSURGICAL) ×1 IMPLANT
EVACUATOR 1/8 PVC DRAIN (DRAIN) ×2 IMPLANT
FACESHIELD LNG OPTICON STERILE (SAFETY) ×10 IMPLANT
GAUZE XEROFORM 5X9 LF (GAUZE/BANDAGES/DRESSINGS) ×2 IMPLANT
GLOVE BIO SURGEON STRL SZ7.5 (GLOVE) ×4 IMPLANT
GLOVE BIOGEL PI IND STRL 7.5 (GLOVE) ×1 IMPLANT
GLOVE BIOGEL PI IND STRL 8 (GLOVE) ×2 IMPLANT
GLOVE BIOGEL PI INDICATOR 7.5 (GLOVE) ×1
GLOVE BIOGEL PI INDICATOR 8 (GLOVE) ×2
GLOVE ECLIPSE 7.0 STRL STRAW (GLOVE) ×2 IMPLANT
GOWN STRL REIN XL XLG (GOWN DISPOSABLE) ×4 IMPLANT
HANDPIECE INTERPULSE COAX TIP (DISPOSABLE) ×1
IMMOBILIZER KNEE 20 (SOFTGOODS) ×2
IMMOBILIZER KNEE 20 THIGH 36 (SOFTGOODS) ×1 IMPLANT
INSERT TIBIAL BEARING SZ 7X11 (Insert) ×2 IMPLANT
KIT BASIN OR (CUSTOM PROCEDURE TRAY) ×2 IMPLANT
NS IRRIG 1000ML POUR BTL (IV SOLUTION) ×2 IMPLANT
PACK TOTAL JOINT (CUSTOM PROCEDURE TRAY) ×2 IMPLANT
PADDING CAST COTTON 6X4 STRL (CAST SUPPLIES) ×2 IMPLANT
POSITIONER SURGICAL ARM (MISCELLANEOUS) ×2 IMPLANT
Primary tibial baseplate ×2 IMPLANT
SET HNDPC FAN SPRY TIP SCT (DISPOSABLE) ×1 IMPLANT
SET PAD KNEE POSITIONER (MISCELLANEOUS) ×2 IMPLANT
SPONGE GAUZE 4X4 12PLY (GAUZE/BANDAGES/DRESSINGS) ×2 IMPLANT
SPONGE LAP 18X18 X RAY DECT (DISPOSABLE) ×2 IMPLANT
STAPLER VISISTAT 35W (STAPLE) ×2 IMPLANT
SUCTION FRAZIER 12FR DISP (SUCTIONS) ×2 IMPLANT
SUT VIC AB 0 CT1 27 (SUTURE) ×2
SUT VIC AB 0 CT1 27XBRD ANTBC (SUTURE) ×2 IMPLANT
SUT VIC AB 1 CT1 27 (SUTURE) ×3
SUT VIC AB 1 CT1 27XBRD ANTBC (SUTURE) ×3 IMPLANT
SUT VIC AB 2-0 CT1 27 (SUTURE) ×2
SUT VIC AB 2-0 CT1 TAPERPNT 27 (SUTURE) ×2 IMPLANT
SUT VLOC 180 0 24IN GS25 (SUTURE) ×2 IMPLANT
TOWEL OR 17X26 10 PK STRL BLUE (TOWEL DISPOSABLE) ×4 IMPLANT
TOWEL OR NON WOVEN STRL DISP B (DISPOSABLE) ×2 IMPLANT
TRAY FOLEY CATH 14FRSI W/METER (CATHETERS) ×2 IMPLANT
Tibial Bearing Insert (Orthopedic Implant) ×2 IMPLANT
WATER STERILE IRR 1500ML POUR (IV SOLUTION) ×2 IMPLANT
WRAP KNEE MAXI GEL POST OP (GAUZE/BANDAGES/DRESSINGS) ×2 IMPLANT

## 2012-11-03 NOTE — Progress Notes (Signed)
Orthopedic Tech Progress Note Patient Details:  Benjamin Romero 07-12-1951 [id number]  CPM Left Knee CPM Left Knee: On Left Knee Flexion (Degrees): 60  Left Knee Extension (Degrees): 0  Additional Comments: trapeze bar   Shawnie Pons 11/03/2012, 11:43 AM

## 2012-11-03 NOTE — Anesthesia Procedure Notes (Signed)
Spinal  Patient location during procedure: OR End time: 11/03/2012 7:31 AM Staffing CRNA/Resident: Enriqueta Shutter Performed by: anesthesiologist  Preanesthetic Checklist Completed: patient identified, site marked, surgical consent, pre-op evaluation, timeout performed, IV checked, risks and benefits discussed and monitors and equipment checked Spinal Block Patient position: sitting Prep: Betadine Patient monitoring: heart rate, continuous pulse ox and blood pressure Approach: midline Location: L3-4 Injection technique: single-shot Needle Needle type: Pencil-Tip and Sprotte  Needle gauge: 22 G Needle length: 9 cm Assessment Sensory level: T6 Additional Notes Expiration date of kit checked and confirmed. Patient tolerated procedure well, without complications.

## 2012-11-03 NOTE — Op Note (Signed)
NAME:  Benjamin Romero, Benjamin Romero NO.:  [id number]  MEDICAL RECORD NO.:  [id number]  LOCATION:  1616                         FACILITY:  [facility]  PHYSICIAN:  Vanita Panda. Magnus Ivan, M.D.DATE OF BIRTH:  1951/01/19  DATE OF PROCEDURE:  11/03/2012 DATE OF DISCHARGE:                              OPERATIVE REPORT   PREOPERATIVE DIAGNOSES:  End-stage arthritis and degenerative joint disease of left knee.  POSTOPERATIVE DIAGNOSES:  End-stage arthritis and degenerative joint disease of left knee.  PROCEDURE:  Left total knee arthroplasty.  IMPLANTS:  Stryker Triathlon knee with size 6 femur, size 7 tibial tray, size 11 poly insert, size 38 patellar button.  SURGEON:  Vanita Panda. Magnus Ivan, M.D.  ANESTHESIA:  Spinal.  BLOOD LOSS:  Less than 200 mL.  TOURNIQUET TIME:  Less than 2 hours.  ANTIBIOTICS:  2 g of IV Ancef.  COMPLICATIONS:  None.  INDICATIONS:  Mr. Engelstad is a 61 year old gentleman with end-stage arthritis involving both of his knees, left worse than right.  He has significant varus deformity.  His activities of daily living has been affected.  He has night pain, pain with activity, pain with range of motion, recurrent effusions and x-ray evidence of severe end-stage arthritis of his right knee with loss of the medial compartment of varus deformity, subchondral cyst and subchondral sclerosis.  It has been recommended that he undergo a total knee arthroplasty.  He does wish to proceed with this.  The risks and benefits of this were explained to him in detail.  PROCEDURE DESCRIPTION:  After informed consent was obtained, appropriate left knee was marked.  He was brought to the operating room and spinal anesthesia was obtained.  He was then placed in the supine position. Nonsterile tourniquet was placed on his upper left thigh, and his left leg was prepped and draped with DuraPrep and sterile drapes including sterile stockinette.  A time-out was called to  identify the correct patient, correct left knee.  We then used an Esmarch to wrap out the leg and tourniquet was inflated to 300 mm of pressure.  I made a midline incision with the knee extended directly over the patella and carried this proximally and distally.  I dissected down to the knee joint and then performed a medial parapatellar arthrotomy.  From there, I entered the knee joint, and there was a large effusion that was encountered.  He had complete loss of cartilage almost throughout the knee.  I removed remnants of the medial and lateral meniscus, the ACL and PCL, and then osteophytes throughout the knee.  With the knee flexed, I then used my tibial cutting guide, I set up for 0 slope and corrected for the varus in his knee.  I then made my tibia resection approximately 9 mm off the high side.  Attention was then turned to the femur.  After open up the femoral canal, intramedullary guide was placed and set at 5 degrees for left femur with a 10-mm resection.  With the guide, I then made my 10-mm resection.  I then trialed a 9-mm insert followed by 11-mm cutting block and this gave him full extension, and after releasing tissue medially, I was pleased with  the stability in varus and valgus as well.  I then put the rotation guide on based on the epicondylar axis and the posterior condyles to assess the size of my tibia.  I then put my 4-in-1 cutting block on for a size 6 femur.  I made my anterior and posterior cuts followed by my chamfer cuts.  I then made my femoral box cut as well. Attention was then turned back to the tibia.  I sized for a size 7 tibial tray.  We did the keel and punch for this with the trial femur and trial tibia in place, I then trialed a 9-mm followed by 11-mm poly and I would like the stability with 11-mm poly and the range of motion. I then cut my patella and drilled lugs for a 38 patellar button.  I then removed all trial components and copiously irrigated  the knee with normal saline solution using pulsatile lavage.  We then cemented the real size 7 Stryker Triathlon tibial tray followed by the real size 6 femur.  I placed the real 9-mm fix bearing polyethylene insert and cemented the size 38 patellar button.  Once the cement had dried, we let the tourniquet down, and hemostasis was obtained with electrocautery.  I then placed a medium Hemovac drain in the arthrotomy and closed the arthrotomy with interrupted #1 Vicryl suture followed by a running 0 V- Loc suture, 2-0 Vicryl in subcutaneous tissue, and staples on the skin. Xeroform followed by well-padded sterile dressing was applied as well as the knee immobilizer.  The patient was taken to the recovery room in stable condition.  All final counts were correct.  There were no complications noted.     Vanita Panda. Magnus Ivan, M.D.     CYB/MEDQ  D:  11/03/2012  T:  11/03/2012  Job:  [id number]

## 2012-11-03 NOTE — H&P (Signed)
TOTAL KNEE ADMISSION H&P  Patient is being admitted for left total knee arthroplasty.  Subjective:  Chief Complaint:left knee pain.  HPI: Benjamin Romero, 61 y.o. male, has a history of pain and functional disability in the left knee due to arthritis and has failed non-surgical conservative treatments for greater than 12 weeks to includeNSAID's and/or analgesics, corticosteriod injections, weight reduction as appropriate and activity modification.  Onset of symptoms was gradual, starting 5 years ago with gradually worsening course since that time. The patient noted no past surgery on the left knee(s).  Patient currently rates pain in the left knee(s) at 8 out of 10 with activity. Patient has night pain, worsening of pain with activity and weight bearing, pain that interferes with activities of daily living, pain with passive range of motion, crepitus and joint swelling.  Patient has evidence of subchondral cysts, subchondral sclerosis, periarticular osteophytes and joint space narrowing by imaging studies. There is no active infection.  Patient Active Problem List   Diagnosis Date Noted  . Arthritis of knee, left 11/03/2012  . Nonspecific (abnormal) findings on radiological and other examination of gastrointestinal tract 12/31/2011  . Unspecified gastritis and gastroduodenitis without mention of hemorrhage 12/31/2011  . Duodenitis 12/31/2011  . Personal history of colonic polyps 11/10/2011  . RESTLESS LEG SYNDROME 03/01/2008  . OBESITY 02/09/2008  . OBSTRUCTIVE SLEEP APNEA 02/09/2008  . Acute pancreatitis 01/30/2008   Past Medical History  Diagnosis Date  . Acute pancreatitis   . Colon polyps   . Diverticulosis   . Pancreatitis   . E-coli UTI 11-02-12    1 month ago- due to diet reducing med used-all clear  . Sleep apnea     no cpap used,dx. 2009    Past Surgical History  Procedure Date  . Back surgery   . Knee surgery   . Eus 12/31/2011    Procedure: UPPER ENDOSCOPIC ULTRASOUND  (EUS) LINEAR;  Surgeon: Rob Bunting, MD;  Location: [facility];  Service: Endoscopy;  Laterality: N/A;  radial linear    Prescriptions prior to admission  Medication Sig Dispense Refill  . Ascorbic Acid (VITAMIN C) 1000 MG tablet Take 1,000 mg by mouth daily.      Marland Kitchen atorvastatin (LIPITOR) 10 MG tablet Take 10 mg by mouth daily with breakfast.      . B Complex-C (B-COMPLEX WITH VITAMIN C) tablet Take 1 tablet by mouth daily.      . fish oil-omega-3 fatty acids 1000 MG capsule Take 1 g by mouth daily.       . meclizine (ANTIVERT) 25 MG tablet Take 25 mg by mouth as needed. Dizziness       No Known Allergies  History  Substance Use Topics  . Smoking status: Former Smoker -- 1.0 packs/day    Types: Cigarettes    Quit date: 12/22/2011  . Smokeless tobacco: Never Used  . Alcohol Use: Yes     Comment: rare use    Family History  Problem Relation Age of Onset  . Thyroid cancer Mother      Review of Systems  Musculoskeletal: Positive for joint pain.  All other systems reviewed and are negative.    Objective:  Physical Exam  Constitutional: He is oriented to person, place, and time. He appears well-developed and well-nourished.  HENT:  Head: Normocephalic and atraumatic.  Eyes: EOM are normal. Pupils are equal, round, and reactive to light.  Neck: Normal range of motion. Neck supple.  Cardiovascular: Normal rate and regular rhythm.  Respiratory: Effort normal and breath sounds normal.  GI: Soft. Bowel sounds are normal.  Musculoskeletal:       Left knee: He exhibits decreased range of motion, effusion and deformity. tenderness found. Medial joint line and lateral joint line tenderness noted.  Neurological: He is alert and oriented to person, place, and time.  Skin: Skin is warm and dry.  Psychiatric: He has a normal mood and affect.    Vital signs in last 24 hours: Temp:  [96.7 F (35.9 C)-97.4 F (36.3 C)] 96.7 F (35.9 C) (11/14 0532) Pulse Rate:  [60-68] 68  (11/14  0532) Resp:  [18] 18  (11/14 0532) BP: (143-151)/(87) 151/87 mmHg (11/14 0532) SpO2:  [96 %-97 %] 96 % (11/14 0532) Weight:  [120.345 kg (265 lb 5 oz)] 120.345 kg (265 lb 5 oz) (11/13 1345)  Labs:   Estimated Body mass index is 35.94 kg/(m^2) as calculated from the following:   Height as of this encounter: 6\' 0" (1.829 m).   Weight as of this encounter: 265 lb(120.203 kg).   Imaging Review Plain radiographs demonstrate severe degenerative joint disease of the left knee(s). The overall alignment issignificant varus. The bone quality appears to be excellent for age and reported activity level.  Assessment/Plan:  End stage arthritis, left knee   The patient history, physical examination, clinical judgment of the provider and imaging studies are consistent with end stage degenerative joint disease of the left knee(s) and total knee arthroplasty is deemed medically necessary. The treatment options including medical management, injection therapy arthroscopy and arthroplasty were discussed at length. The risks and benefits of total knee arthroplasty were presented and reviewed. The risks due to aseptic loosening, infection, stiffness, patella tracking problems, thromboembolic complications and other imponderables were discussed. The patient acknowledged the explanation, agreed to proceed with the plan and consent was signed. Patient is being admitted for inpatient treatment for surgery, pain control, PT, OT, prophylactic antibiotics, VTE prophylaxis, progressive ambulation and ADL's and discharge planning. The patient is planning to be discharged home with home health services

## 2012-11-03 NOTE — Anesthesia Preprocedure Evaluation (Addendum)
Anesthesia Evaluation  Patient identified by MRN, date of birth, ID band Patient awake    Reviewed: Allergy & Precautions, H&P , NPO status , Patient's Chart, lab work & pertinent test results  Airway Mallampati: II TM Distance: >3 FB Neck ROM: Full    Dental No notable dental hx. (+) Dental Advisory Given, Chipped and Missing   Pulmonary neg pulmonary ROS, sleep apnea , former smoker,  breath sounds clear to auscultation  Pulmonary exam normal       Cardiovascular Exercise Tolerance: Good - CAD and - Past MI negative cardio ROS  Rhythm:Regular Rate:Normal     Neuro/Psych negative neurological ROS  negative psych ROS   GI/Hepatic negative GI ROS, Neg liver ROS, H/o pancreatitis   Endo/Other  negative endocrine ROS  Renal/GU negative Renal ROS  negative genitourinary   Musculoskeletal negative musculoskeletal ROS (+)   Abdominal (+) + obese,   Peds  Hematology negative hematology ROS (+)   Anesthesia Other Findings   Reproductive/Obstetrics negative OB ROS                        Anesthesia Physical Anesthesia Plan  ASA: II  Anesthesia Plan: Spinal   Post-op Pain Management:    Induction:   Airway Management Planned: Simple Face Mask  Additional Equipment:   Intra-op Plan:   Post-operative Plan:   Informed Consent: I have reviewed the patients History and Physical, chart, labs and discussed the procedure including the risks, benefits and alternatives for the proposed anesthesia with the patient or authorized representative who has indicated his/her understanding and acceptance.   Dental advisory given  Plan Discussed with: CRNA  Anesthesia Plan Comments:         Anesthesia Quick Evaluation                                  Anesthesia Evaluation  Patient identified by MRN, date of birth, ID band Patient awake    Reviewed: Allergy & Precautions, H&P , NPO status ,  Patient's Chart, lab work & pertinent test results  Airway Mallampati: II TM Distance: >3 FB Neck ROM: full    Dental No notable dental hx. (+) Chipped and Dental Advidsory Given   Pulmonary neg pulmonary ROS, sleep apnea (Noncompliant with CPAP) , former smoker (1 1/2ppd; discontinued 12/22/11) clear to auscultation  Pulmonary exam normal       Cardiovascular Exercise Tolerance: Good neg cardio ROS regular Normal    Neuro/Psych Negative Neurological ROS  Negative Psych ROS   GI/Hepatic negative GI ROS, Neg liver ROS,   Endo/Other  Negative Endocrine ROS  Renal/GU negative Renal ROS  Genitourinary negative   Musculoskeletal   Abdominal   Peds  Hematology negative hematology ROS (+)   Anesthesia Other Findings   Reproductive/Obstetrics negative OB ROS                           Anesthesia Physical Anesthesia Plan  ASA: II  Anesthesia Plan:    Post-op Pain Management:    Induction:   Airway Management Planned:   Additional Equipment:   Intra-op Plan:   Post-operative Plan:   Informed Consent: I have reviewed the patients History and Physical, chart, labs and discussed the procedure including the risks, benefits and alternatives for the proposed anesthesia with the patient or authorized representative who has indicated his/her understanding and acceptance.  Dental Advisory Given  Plan Discussed with: CRNA  Anesthesia Plan Comments:         Anesthesia Quick Evaluation

## 2012-11-03 NOTE — Brief Op Note (Signed)
11/03/2012  9:40 AM  PATIENT:  Benjamin Romero  61 y.o. male  PRE-OPERATIVE DIAGNOSIS:  Severe osteoarthritis left knee  POST-OPERATIVE DIAGNOSIS:  Severe Osteoarthritis of the left knee  PROCEDURE:  Procedure(s) (LRB) with comments: TOTAL KNEE ARTHROPLASTY (Left) - Left Total Knee Arthroplasty  SURGEON:  Surgeon(s) and Role:    * Kathryne Hitch, MD - Primary  PHYSICIAN ASSISTANT:   ASSISTANTS: none   ANESTHESIA:   spinal  EBL:  Total I/O In: 2000 [I.V.:2000] Out: 450 [Urine:300; Blood:150]  BLOOD ADMINISTERED:none  DRAINS: (medium') Hemovact drain(s) in the knee joint with  Suction Open   LOCAL MEDICATIONS USED:  NONE  SPECIMEN:  No Specimen  DISPOSITION OF SPECIMEN:  N/A  COUNTS:  YES  TOURNIQUET:   Total Tourniquet Time Documented: Thigh (Left) - 80 minutes  DICTATION: .Other Dictation: Dictation Number [id number]  PLAN OF CARE: Admit to inpatient   PATIENT DISPOSITION:  PACU - hemodynamically stable.   Delay start of Pharmacological VTE agent (>24hrs) due to surgical blood loss or risk of bleeding: no

## 2012-11-03 NOTE — Anesthesia Postprocedure Evaluation (Signed)
Anesthesia Post Note  Patient: Benjamin Romero  Procedure(s) Performed: Procedure(s) (LRB): TOTAL KNEE ARTHROPLASTY (Left)  Anesthesia type: Spinal  Patient location: PACU  Post pain: Pain level controlled  Post assessment: Post-op Vital signs reviewed  Last Vitals: BP 132/88  Pulse 58  Temp 36.8 C (Oral)  Resp 10  Ht 6' (1.829 m)  Wt 265 lb (120.203 kg)  BMI 35.94 kg/m2  SpO2 97%  Post vital signs: Reviewed  Level of consciousness: sedated  Complications: No apparent anesthesia complications

## 2012-11-03 NOTE — Evaluation (Signed)
Physical Therapy Evaluation Patient Details Name: Benjamin Romero MRN: [id number] DOB: [birth year] Today's Date: 11/03/2012 Time: 4098-1191 PT Time Calculation (min): 28 min  PT Assessment / Plan / Recommendation Clinical Impression  61 yo male s/p L TKA POD 0. Mobilizing fairly well. Anticipate pt will progress well during stay. Recommend HHPT to improve strength, activity tolerance and gait in order to maximize independence with functional mobility.     PT Assessment  Patient needs continued PT services    Follow Up Recommendations  Home health PT    Does the patient have the potential to tolerate intense rehabilitation      Barriers to Discharge        Equipment Recommendations  None recommended by PT    Recommendations for Other Services OT consult   Frequency 7X/week    Precautions / Restrictions Precautions Precautions: Knee Required Braces or Orthoses: Knee Immobilizer - Left Knee Immobilizer - Left: Discontinue once straight leg raise with < 10 degree lag Restrictions Weight Bearing Restrictions: No LLE Weight Bearing: Weight bearing as tolerated   Pertinent Vitals/Pain 7/10 L knee with activity      Mobility  Bed Mobility Bed Mobility: Supine to Sit Supine to Sit: 3: Mod assist Details for Bed Mobility Assistance: Assist for trunk to upright and L LE off bed. VCs safety, technique, hand placement.  Transfers Transfers: Sit to Stand;Stand to Sit Sit to Stand: 4: Min assist;From bed;From elevated surface Stand to Sit: 4: Min assist;To chair/3-in-1;With armrests Details for Transfer Assistance: VCs safety, technique, hand placement. Assist to rise, stabilize, control descent.  Ambulation/Gait Ambulation/Gait Assistance: 4: Min assist Ambulation Distance (Feet): 75 Feet Assistive device: Rolling walker Ambulation/Gait Assistance Details: VCs safety, technique, sequence, pacing, L foot positioning. Assist to steady intermittently and to keep RW at proper  distance.  Gait Pattern: Antalgic;Step-to pattern;Decreased stride length    Shoulder Instructions     Exercises     PT Diagnosis: Difficulty walking;Abnormality of gait;Acute pain  PT Problem List: Decreased strength;Decreased range of motion;Decreased activity tolerance;Decreased mobility;Pain;Decreased knowledge of precautions;Decreased knowledge of use of DME PT Treatment Interventions: DME instruction;Gait training;Functional mobility training;Therapeutic activities;Therapeutic exercise;Patient/family education   PT Goals Acute Rehab PT Goals PT Goal Formulation: With patient Time For Goal Achievement: 11/10/12 Potential to Achieve Goals: Good Pt will go Supine/Side to Sit: with supervision PT Goal: Supine/Side to Sit - Progress: Goal set today Pt will go Sit to Supine/Side: with supervision PT Goal: Sit to Supine/Side - Progress: Goal set today Pt will go Sit to Stand: with supervision PT Goal: Sit to Stand - Progress: Goal set today Pt will Ambulate: 51 - 150 feet;with supervision;with rolling walker PT Goal: Ambulate - Progress: Goal set today Pt will Perform Home Exercise Program: with supervision, verbal cues required/provided PT Goal: Perform Home Exercise Program - Progress: Goal set today  Visit Information  Last PT Received On: 11/03/12 Assistance Needed: +1    Subjective Data  Subjective: "I'll get up....and keep going right out that door" Patient Stated Goal: Home   Prior Functioning  Home Living Lives With: Spouse Available Help at Discharge: Family Type of Home: Apartment Home Access: Level entry Home Layout: One level Home Adaptive Equipment: Walker - rolling;Bedside commode/3-in-1;Straight cane Prior Function Level of Independence: Independent Driving: Yes Communication Communication: No difficulties    Cognition  Overall Cognitive Status: Appears within functional limits for tasks assessed/performed Arousal/Alertness: Awake/alert Orientation  Level: Appears intact for tasks assessed Behavior During Session: [facility] for tasks performed  Extremity/Trunk Assessment Right Lower Extremity Assessment RLE ROM/Strength/Tone: [facility] for tasks assessed Left Lower Extremity Assessment LLE ROM/Strength/Tone: Deficits LLE ROM/Strength/Tone Deficits: SLR 3-/5, moves ankle well.  LLE Sensation: WFL - Light Touch Trunk Assessment Trunk Assessment: Normal   Balance    End of Session PT - End of Session Equipment Utilized During Treatment: Gait belt Activity Tolerance: Patient tolerated treatment well Patient left: in chair;with call bell/phone within reach  GP     [name] [facility] 11/03/2012, 4:59 PM [phone]

## 2012-11-03 NOTE — Transfer of Care (Signed)
Immediate Anesthesia Transfer of Care Note  Patient: Benjamin Romero  Procedure(s) Performed: Procedure(s) (LRB) with comments: TOTAL KNEE ARTHROPLASTY (Left) - Left Total Knee Arthroplasty  Patient Location: PACU  Anesthesia Type:Regional  Level of Consciousness: awake, alert  and oriented  Airway & Oxygen Therapy: Patient Spontanous Breathing and Patient connected to face mask oxygen  Post-op Assessment: Report given to PACU RN and Post -op Vital signs reviewed and stable  Post vital signs: Reviewed and stable  Complications: No apparent anesthesia complications

## 2012-11-04 LAB — CBC
HCT: 37.1 % — ABNORMAL LOW (ref 39.0–52.0)
Hemoglobin: 12.5 g/dL — ABNORMAL LOW (ref 13.0–17.0)
MCV: 88.8 fL (ref 78.0–100.0)
RDW: 14.8 % (ref 11.5–15.5)
WBC: 11.4 10*3/uL — ABNORMAL HIGH (ref 4.0–10.5)

## 2012-11-04 LAB — BASIC METABOLIC PANEL
BUN: 9 mg/dL (ref 6–23)
Chloride: 101 mEq/L (ref 96–112)
Creatinine, Ser: 1.29 mg/dL (ref 0.50–1.35)
GFR calc Af Amer: 67 mL/min — ABNORMAL LOW (ref >90)
Glucose, Bld: 135 mg/dL — ABNORMAL HIGH (ref 70–99)

## 2012-11-04 NOTE — Evaluation (Signed)
Occupational Therapy Evaluation Patient Details Name: Benjamin Romero MRN: [id number] DOB: Jun 05, 1951 Today's Date: 11/04/2012 Time: 1478-2956 OT Time Calculation (min): 11 min  OT Assessment / Plan / Recommendation Clinical Impression  Pt doing well POD 1 LTKR. All education completed. Pt will have necessary level of A from wife upon d/c.    OT Assessment  Patient does not need any further OT services    Follow Up Recommendations  No OT follow up    Barriers to Discharge      Equipment Recommendations  None recommended by OT    Recommendations for Other Services    Frequency       Precautions / Restrictions Precautions Precautions: Knee Required Braces or Orthoses: Knee Immobilizer - Left Knee Immobilizer - Left: Discontinue once straight leg raise with < 10 degree lag Restrictions Weight Bearing Restrictions: No LLE Weight Bearing: Weight bearing as tolerated   Pertinent Vitals/Pain Pt reported 7/10 L knee pain with activity. Repositioned for comfort and cold applied.    ADL  Grooming: Simulated;Min guard Where Assessed - Grooming: Supported standing Toilet Transfer: Performed;Min Pension scheme manager Method: Sit to Barista: Raised toilet seat with arms (or 3-in-1 over toilet) Toileting - Clothing Manipulation and Hygiene: Simulated;Min guard Where Assessed - Toileting Clothing Manipulation and Hygiene: Sit to stand from 3-in-1 or toilet Transfers/Ambulation Related to ADLs: Pt ambulated to the bathroom with minguard A and cues to pace self.    OT Diagnosis:    OT Problem List:   OT Treatment Interventions:     OT Goals    Visit Information  Last OT Received On: 11/04/12 Assistance Needed: +1    Subjective Data  Subjective: My pain was like a 12 last night. Patient Stated Goal: I want to go back to work.   Prior Functioning     Home Living Lives With: Spouse Available Help at Discharge: Family Type of Home: Apartment Home  Access: Level entry Home Layout: One level Bathroom Shower/Tub: Engineer, manufacturing systems: Standard Home Adaptive Equipment: Bedside commode/3-in-1;Walker - rolling;Straight cane Prior Function Level of Independence: Independent Driving: Yes Communication Communication: No difficulties Dominant Hand: Right         Vision/Perception     Cognition  Overall Cognitive Status: Appears within functional limits for tasks assessed/performed Arousal/Alertness: Awake/alert Orientation Level: Appears intact for tasks assessed Behavior During Session: [facility] for tasks performed    Extremity/Trunk Assessment Right Upper Extremity Assessment RUE ROM/Strength/Tone: [facility] for tasks assessed Left Upper Extremity Assessment LUE ROM/Strength/Tone: WFL for tasks assessed     Mobility Transfers Sit to Stand: 4: Min assist;With upper extremity assist;From chair/3-in-1 Stand to Sit: 4: Min assist;With upper extremity assist;With armrests;To chair/3-in-1 Details for Transfer Assistance: VCs safety, technique, hand placement. Assist to rise, stabilize, control descent.      Shoulder Instructions     Exercise     Balance     End of Session OT - End of Session Equipment Utilized During Treatment: Gait belt Activity Tolerance: Patient tolerated treatment well Patient left: in chair;with call bell/phone within reach CPM Left Knee CPM Left Knee: Off  GO     Havanna Groner A OTR/L [id number] 11/04/2012, 11:57 AM

## 2012-11-04 NOTE — Progress Notes (Addendum)
Physical Therapy Treatment Patient Details Name: Benjamin Romero MRN: [id number] DOB: 12-15-1951 Today's Date: 11/04/2012 Time: 4098-1191 PT Time Calculation (min): 29 min  PT Assessment / Plan / Recommendation Comments on Treatment Session  Pt unsteady at times. Requires moderate verbal cueing for safety, pacing. Reports increased pain this session with ambulation and ROM exercises. Notified RN of pain level.    Follow Up Recommendations  Home health PT     Does the patient have the potential to tolerate intense rehabilitation     Barriers to Discharge        Equipment Recommendations  None recommended by PT    Recommendations for Other Services OT consult  Frequency 7X/week   Plan Discharge plan remains appropriate    Precautions / Restrictions Precautions Precautions: Knee Required Braces or Orthoses: Knee Immobilizer - Left Knee Immobilizer - Left: Discontinue once straight leg raise with < 10 degree lag Restrictions Weight Bearing Restrictions: No LLE Weight Bearing: Weight bearing as tolerated   Pertinent Vitals/Pain 9/10 R knee anterior and posterior    Mobility  Bed Mobility Bed Mobility: Sit to Supine Sit to Supine: HOB flat;4: Min guard Details for Bed Mobility Assistance: Pt able to lift LE onto bed without assist.  Transfers Transfers: Sit to Stand;Stand to Sit Sit to Stand: 4: Min guard;From bed;From chair/3-in-1;With armrests Stand to Sit: 4: Min guard;To bed;To elevated surface Details for Transfer Assistance: Moderate VCs safety, technique,hand placement.  Ambulation/Gait Ambulation/Gait Assistance: 4: Min assist Ambulation Distance (Feet): 115 Feet Assistive device: Rolling walker Ambulation/Gait Assistance Details: Assist to stabilize intermittently. Moderate VCs safety, technique, sequence, pacing. 2 standing rest breaks needed this session. Pt reports increased pain, specifically hamstring area while ambulating.  Gait Pattern: Antalgic;Step-to  pattern;Wide base of support;Trunk flexed    Exercises Total Joint Exercises Ankle Circles/Pumps: AROM;Both;10 reps;Supine Quad Sets: AROM;Both;10 reps;Supine Short Arc Quad: Left;10 reps;AAROM;Supine Heel Slides: AAROM;Left;10 reps;Supine  ~ 40-45 degrees Hip ABduction/ADduction: Left;10 reps;Supine;AROM Straight Leg Raises: AROM;Left;10 reps;Supine   PT Diagnosis:    PT Problem List:   PT Treatment Interventions:     PT Goals Acute Rehab PT Goals Pt will go Sit to Supine/Side: with supervision PT Goal: Sit to Supine/Side - Progress: Progressing toward goal Pt will go Sit to Stand: with supervision PT Goal: Sit to Stand - Progress: Progressing toward goal Pt will Ambulate: 51 - 150 feet;with supervision;with rolling walker PT Goal: Ambulate - Progress: Progressing toward goal Pt will Perform Home Exercise Program: with supervision, verbal cues required/provided PT Goal: Perform Home Exercise Program - Progress: Progressing toward goal  Visit Information  Last PT Received On: 11/04/12 Assistance Needed: +1    Subjective Data  Subjective: "This knee hurts!!!" Patient Stated Goal: Home tomorrow   Cognition  Overall Cognitive Status: Appears within functional limits for tasks assessed/performed Arousal/Alertness: Awake/alert Orientation Level: Appears intact for tasks assessed Behavior During Session: [facility] for tasks performed    Balance     End of Session PT - End of Session Equipment Utilized During Treatment: Left knee immobilizer Activity Tolerance: Patient limited by pain Patient left: in bed;with call bell/phone within reach   GP     [name] [facility] 11/04/2012, 2:52 PM [phone]

## 2012-11-04 NOTE — Progress Notes (Signed)
Subjective: 1 Day Post-Op Procedure(s) (LRB): TOTAL KNEE ARTHROPLASTY (Left) Patient reports pain as mild.    Objective: Vital signs in last 24 hours: Temp:  [97.4 F (36.3 C)-100 F (37.8 C)] 98.8 F (37.1 C) (11/15 0539) Pulse Rate:  [49-77] 68  (11/15 0539) Resp:  [8-16] 16  (11/15 0539) BP: (97-171)/(63-96) 113/67 mmHg (11/15 0539) SpO2:  [95 %-100 %] 98 % (11/15 0539) FiO2 (%):  [99 %] 99 % (11/14 1320) Weight:  [120.3 kg (265 lb 3.4 oz)] 120.3 kg (265 lb 3.4 oz) (11/14 1320)  Intake/Output from previous day: 11/14 0701 - 11/15 0700 In: 4795 [P.O.:720; I.V.:3570; IV Piggyback:155] Out: 1900 [Urine:1250; Drains:500; Blood:150] Intake/Output this shift:     Basename 11/04/12 0434 11/02/12 1415  HGB 12.5* 16.1    Basename 11/04/12 0434 11/02/12 1415  WBC 11.4* 11.0*  RBC 4.18* 5.18  HCT 37.1* 45.8  PLT 209 223    Basename 11/04/12 0434 11/02/12 1415  NA 133* 139  K 3.7 3.8  CL 101 103  CO2 25 28  BUN 9 12  CREATININE 1.29 1.01  GLUCOSE 135* 77  CALCIUM 8.2* 9.4    Basename 11/02/12 1415  LABPT --  INR 0.91    Sensation intact distally Intact pulses distally Dorsiflexion/Plantar flexion intact Incision: dressing C/D/I Compartment soft  Assessment/Plan: 1 Day Post-Op Procedure(s) (LRB): TOTAL KNEE ARTHROPLASTY (Left) Up with therapy  Ashiah Karpowicz Y 11/04/2012, 7:25 AM

## 2012-11-04 NOTE — Progress Notes (Signed)
Physical Therapy Treatment Patient Details Name: Benjamin Romero MRN: [id number] DOB: [birth year] Today's Date: 11/04/2012 Time: 3244-0102 PT Time Calculation (min): 13 min  PT Assessment / Plan / Recommendation Comments on Treatment Session  Progressing well. Requires min cues for safety,pacing. Recommend HHPT.     Follow Up Recommendations  Home health PT     Does the patient have the potential to tolerate intense rehabilitation     Barriers to Discharge        Equipment Recommendations  None recommended by PT    Recommendations for Other Services OT consult  Frequency 7X/week   Plan Discharge plan remains appropriate    Precautions / Restrictions Precautions Precautions: Knee Required Braces or Orthoses: Knee Immobilizer - Left Knee Immobilizer - Left: Discontinue once straight leg raise with < 10 degree lag Restrictions Weight Bearing Restrictions: No LLE Weight Bearing: Weight bearing as tolerated   Pertinent Vitals/Pain 9/10 L knee    Mobility  Transfers Transfers: Sit to Stand;Stand to Sit Sit to Stand: 4: Min guard;From chair/3-in-1 Stand to Sit: 4: Min assist;To chair/3-in-1;With armrests;With upper extremity assist Details for Transfer Assistance: VCs safety, technique, hand palcement. Assist to contol descent to chair.  Ambulation/Gait Ambulation/Gait Assistance: 4: Min assist Ambulation Distance (Feet): 115 Feet Assistive device: Rolling walker Ambulation/Gait Assistance Details: Assist to stabilize throughout session. VCs safety, technique sequence, pacing.  Gait Pattern: Antalgic;Step-through pattern    Exercises     PT Diagnosis:    PT Problem List:   PT Treatment Interventions:     PT Goals Acute Rehab PT Goals Pt will go Sit to Stand: with supervision PT Goal: Sit to Stand - Progress: Progressing toward goal Pt will Ambulate: 51 - 150 feet;with supervision;with rolling walker PT Goal: Ambulate - Progress: Progressing toward goal  Visit  Information  Last PT Received On: 11/04/12 Assistance Needed: +1    Subjective Data  Subjective: "I was about a 12 last night" Patient Stated Goal: Home   Cognition  Overall Cognitive Status: Appears within functional limits for tasks assessed/performed Arousal/Alertness: Awake/alert Orientation Level: Appears intact for tasks assessed Behavior During Session: [facility] for tasks performed    Balance     End of Session PT - End of Session Equipment Utilized During Treatment: Gait belt Activity Tolerance: Patient tolerated treatment well Patient left: in chair;with call bell/phone within reach   GP     [name] [facility] 11/04/2012, 12:27 PM [phone]

## 2012-11-04 NOTE — Progress Notes (Signed)
Utilization review completed.  

## 2012-11-05 LAB — CBC
HCT: 33.4 % — ABNORMAL LOW (ref 39.0–52.0)
MCH: 30.7 pg (ref 26.0–34.0)
MCHC: 34.4 g/dL (ref 30.0–36.0)
MCV: 89.3 fL (ref 78.0–100.0)
RDW: 14.7 % (ref 11.5–15.5)

## 2012-11-05 MED ORDER — OXYCODONE-ACETAMINOPHEN 5-325 MG PO TABS: 1.0000 | ORAL_TABLET | ORAL | Status: AC | PRN

## 2012-11-05 MED ORDER — METHOCARBAMOL 500 MG PO TABS: 500.0000 mg | ORAL_TABLET | Freq: Four times a day (QID) | ORAL | Status: DC | PRN

## 2012-11-05 MED ORDER — ASPIRIN EC 325 MG PO TBEC: 325.0000 mg | DELAYED_RELEASE_TABLET | Freq: Two times a day (BID) | ORAL | Status: DC

## 2012-11-05 NOTE — Discharge Summary (Signed)
Patient ID: ROLFE BENSINGER MRN: [id number] DOB/AGE: 1951-07-04 61 y.o.  Admit date: 11/03/2012 Discharge date: 11/05/2012  Admission Diagnoses:  Principal Problem:  *Arthritis of knee, left   Discharge Diagnoses:  Same  Past Medical History  Diagnosis Date  . Acute pancreatitis   . Colon polyps   . Diverticulosis   . Pancreatitis   . E-coli UTI 11-02-12    1 month ago- due to diet reducing med used-all clear  . Sleep apnea     no cpap used,dx. 2009    Surgeries: Procedure(s): TOTAL KNEE ARTHROPLASTY on 11/03/2012   Consultants:    Discharged Condition: Improved  Hospital Course: SHRIYANS DILULLO is an 61 y.o. male who was admitted 11/03/2012 for operative treatment ofArthritis of knee, left. Patient has severe unremitting pain that affects sleep, daily activities, and work/hobbies. After pre-op clearance the patient was taken to the operating room on 11/03/2012 and underwent  Procedure(s): TOTAL KNEE ARTHROPLASTY.    Patient was given perioperative antibiotics: Anti-infectives     Start     Dose/Rate Route Frequency Ordered Stop   11/03/12 1400   ceFAZolin (ANCEF) IVPB 2 g/50 mL premix        2 g 100 mL/hr over 30 Minutes Intravenous Every 6 hours 11/03/12 1346 11/03/12 2057   11/03/12 0625   ceFAZolin (ANCEF) 3 g in dextrose 5 % 50 mL IVPB        3 g 160 mL/hr over 30 Minutes Intravenous 60 min pre-op 11/03/12 0625 11/03/12 0733           Patient was given sequential compression devices, early ambulation, and chemoprophylaxis to prevent DVT.  Patient benefited maximally from hospital stay and there were no complications.    Recent vital signs: Patient Vitals for the past 24 hrs:  BP Temp Temp src Pulse Resp SpO2  11/05/12 0623 120/71 mmHg 99.5 F (37.5 C) Oral 79  16  94 %  11/05/12 0000 - - - - - 98 %  11-29-2012 2144 127/60 mmHg 100.3 F (37.9 C) Oral 81  16  96 %  Nov 29, 2012 1600 - - - - 16  99 %  2012/11/29 1507 143/74 mmHg 98.4 F (36.9 C) - 71  16   99 %  2012/11/29 1128 - - - - 16  98 %     Recent laboratory studies:  Basename 11/05/12 0544 November 29, 2012 0434 11/02/12 1415  WBC 12.8* 11.4* --  HGB 11.5* 12.5* --  HCT 33.4* 37.1* --  PLT 201 209 --  NA -- 133* 139  K -- 3.7 3.8  CL -- 101 103  CO2 -- 25 28  BUN -- 9 12  CREATININE -- 1.29 1.01  GLUCOSE -- 135* 77  INR -- -- 0.91  CALCIUM -- 8.2* --     Discharge Medications:     Medication List     As of 11/05/2012  9:46 AM    TAKE these medications         aspirin EC 325 MG tablet   Take 1 tablet (325 mg total) by mouth 2 (two) times daily.      atorvastatin 10 MG tablet   Commonly known as: LIPITOR   Take 10 mg by mouth daily with breakfast.      B-complex with vitamin C tablet   Take 1 tablet by mouth daily.      fish oil-omega-3 fatty acids 1000 MG capsule   Take 1 g by mouth daily.  meclizine 25 MG tablet   Commonly known as: ANTIVERT   Take 25 mg by mouth as needed. Dizziness      methocarbamol 500 MG tablet   Commonly known as: ROBAXIN   Take 1 tablet (500 mg total) by mouth every 6 (six) hours as needed.      oxyCODONE-acetaminophen 5-325 MG per tablet   Commonly known as: PERCOCET/ROXICET   Take 1-2 tablets by mouth every 4 (four) hours as needed for pain.      vitamin C 1000 MG tablet   Take 1,000 mg by mouth daily.        Diagnostic Studies: Dg Knee Left Port  11/03/2012  *RADIOLOGY REPORT*  Clinical Data: Left total knee replacement, postoperative.  PORTABLE LEFT KNEE - 1-2 VIEW  Comparison: 03/12/2005  Findings: Total knee prosthesis is in place without fracture or complicating feature observed.  Normal appearance of the metal - methacrylate and methacrylate - bone interfaces.  Drain and skin clips noted with expected amount of gas in the soft tissues.  IMPRESSION:  1.  Left total knee prosthesis in place without complicating radiographic feature observed.   Original Report Authenticated By: Gaylyn Rong, M.D.     Disposition:  01-Home or Self Care      Discharge Orders    Future Orders Please Complete By Expires   Diet - low sodium heart healthy      Call MD / Call 911      Comments:   If you experience chest pain or shortness of breath, CALL 911 and be transported to the hospital emergency room.  If you develope a fever above 101 F, pus (white drainage) or increased drainage or redness at the wound, or calf pain, call your surgeon's office.   Constipation Prevention      Comments:   Drink plenty of fluids.  Prune juice may be helpful.  You may use a stool softener, such as Colace (over the counter) 100 mg twice a day.  Use MiraLax (over the counter) for constipation as needed.   Increase activity slowly as tolerated      Discharge instructions      Comments:   Increase your activities as comfort allows. Expect swelling; ice and elevation as needed. You can get your current dressing wet in the shower; you can get your actual incision wet starting 11/07/12, then dry dressing daily as needed.   Discontinue IV      Discharge patient         Follow-up Information    Follow up with Kathryne Hitch, MD. In 2 weeks.   Contact information:   [address] [place] [address] [phone]           Signed: Kathryne Hitch 11/05/2012, 9:46 AM

## 2012-11-05 NOTE — Progress Notes (Signed)
Patient ID: Benjamin Romero, male   DOB: [birth year], 60 y.o.   MRN: [id number] Doing well.  Can go home today.

## 2012-11-05 NOTE — Progress Notes (Signed)
Discharged to family auto. Assessment unchanged from am.

## 2012-11-05 NOTE — Progress Notes (Signed)
Physical Therapy Treatment Patient Details Name: Benjamin Romero MRN: [id number] DOB: [birth year] Today's Date: 11/05/2012 Time: 4098-1191 PT Time Calculation (min): 28 min  PT Assessment / Plan / Recommendation Comments on Treatment Session  Pt ambulated 300' with min/guard assist for balance. Reviewed HEP with pt and wife. HHPT recommended.     Follow Up Recommendations        Does the patient have the potential to tolerate intense rehabilitation     Barriers to Discharge        Equipment Recommendations       Recommendations for Other Services    Frequency     Plan      Precautions / Restrictions Precautions Precautions: Knee Required Braces or Orthoses: Knee Immobilizer - Left Knee Immobilizer - Left: Discontinue once straight leg raise with < 10 degree lag Restrictions Weight Bearing Restrictions: No LLE Weight Bearing: Weight bearing as tolerated   Pertinent Vitals/Pain **"12" out of 10 L knee with walking RN notified*    Mobility  Bed Mobility Bed Mobility: Sit to Supine Supine to Sit: 4: Min assist Details for Bed Mobility Assistance: Min A for LLE Transfers Transfers: Sit to Stand;Stand to Sit Sit to Stand: 4: Min guard;From bed;With upper extremity assist Stand to Sit: 4: Min guard;To bed;To elevated surface;With armrests;With upper extremity assist Details for Transfer Assistance: Moderate VCs safety, technique,hand placement.  Ambulation/Gait Ambulation/Gait Assistance: 4: Min assist Ambulation Distance (Feet): 300 Feet Assistive device: Rolling walker Gait Pattern: Antalgic;Step-to pattern;Wide base of support;Trunk flexed General Gait Details: VCs to step into frame of RW, min/guard assist for balance    Exercises Total Joint Exercises Ankle Circles/Pumps: AROM;Both;10 reps;Supine Quad Sets: AROM;Both;10 reps;Supine Short Arc Quad: Left;10 reps;AAROM;Supine Heel Slides: AAROM;Left;10 reps;Supine Hip ABduction/ADduction: Left;10  reps;Supine;AROM Straight Leg Raises: AROM;Left;10 reps;Supine   PT Diagnosis:    PT Problem List:   PT Treatment Interventions:     PT Goals Acute Rehab PT Goals PT Goal Formulation: With patient Time For Goal Achievement: 11/10/12 Potential to Achieve Goals: Good Pt will go Supine/Side to Sit: with supervision PT Goal: Supine/Side to Sit - Progress: Progressing toward goal Pt will go Sit to Supine/Side: with supervision Pt will go Sit to Stand: with supervision PT Goal: Sit to Stand - Progress: Progressing toward goal Pt will Ambulate: 51 - 150 feet;with supervision;with rolling walker PT Goal: Ambulate - Progress: Progressing toward goal Pt will Perform Home Exercise Program: with supervision, verbal cues required/provided PT Goal: Perform Home Exercise Program - Progress: Progressing toward goal  Visit Information  Last PT Received On: 11/05/12 Assistance Needed: +1    Subjective Data  Subjective: I've fallen off my trailer several times.  Patient Stated Goal: to go home   Cognition  Overall Cognitive Status: Appears within functional limits for tasks assessed/performed Arousal/Alertness: Awake/alert Orientation Level: Appears intact for tasks assessed Behavior During Session: [facility] for tasks performed    Balance     End of Session PT - End of Session Equipment Utilized During Treatment: Gait belt;Left knee immobilizer Patient left: in chair;with call bell/phone within reach;with family/visitor present;with nursing in room Nurse Communication: Mobility status CPM Left Knee CPM Left Knee: Off   GP     Benjamin Romero 11/05/2012, 11:03 AM  [phone]

## 2012-11-07 NOTE — Care Management Note (Signed)
    Page 1 of 2   11/07/2012     5:29:52 PM   CARE MANAGEMENT NOTE 11/07/2012  Patient:  Benjamin, Romero   Account Number:  [id number]  Date Initiated:  11/04/2012  Documentation initiated by:  Colleen Can  Subjective/Objective Assessment:   DX osteoarthritis left knee; total knee replacement    Referral to Genevieve Norlander from doctor's office     Action/Plan:   CM spoke with patient. Plans are for patienT to return to his home in [place] where spouse will be cxaregiver. He already has RW, Cane and BSC at home   Anticipated DC Date:  11/06/2012   Anticipated DC Plan:  HOME W HOME HEALTH SERVICES  In-house referral  NA      DC Planning Services  CM consult      [facility] Choice  HOME HEALTH   Choice offered to / List presented to:  C-1 Patient   DME arranged  NA      DME agency  NA     HH arranged  HH-2 PT      [facility] agency  [facility]   Status of service:  Completed, signed off Medicare Important Message given?  NA - LOS <3 / Initial given by admissions (If response is "NO", the following Medicare IM given date fields will be blank) Date Medicare IM given:   Date Additional Medicare IM given:    Discharge Disposition:  HOME W HOME HEALTH SERVICES  Per UR Regulation:    If discussed at Long Length of Stay Meetings, dates discussed:    Comments:  11/07/2012 Damaris Schooner RN CCM [phone] pT WAS DISCHARGED ON sATURRDAY 11/05/2012 WITH [facility] IN PLACE. sERVICES STARTED ON 11/06/2012.

## 2012-11-08 ENCOUNTER — Encounter (HOSPITAL_COMMUNITY): Payer: Self-pay | Admitting: Orthopaedic Surgery

## 2012-11-29 ENCOUNTER — Ambulatory Visit: Payer: Medicare Other | Attending: Orthopaedic Surgery | Admitting: Physical Therapy

## 2012-11-29 DIAGNOSIS — R262 Difficulty in walking, not elsewhere classified: Secondary | ICD-10-CM | POA: Insufficient documentation

## 2012-11-29 DIAGNOSIS — M25569 Pain in unspecified knee: Secondary | ICD-10-CM | POA: Insufficient documentation

## 2012-11-29 DIAGNOSIS — IMO0001 Reserved for inherently not codable concepts without codable children: Secondary | ICD-10-CM | POA: Insufficient documentation

## 2012-11-29 DIAGNOSIS — M25669 Stiffness of unspecified knee, not elsewhere classified: Secondary | ICD-10-CM | POA: Insufficient documentation

## 2012-11-29 DIAGNOSIS — Z96659 Presence of unspecified artificial knee joint: Secondary | ICD-10-CM | POA: Insufficient documentation

## 2012-12-02 ENCOUNTER — Ambulatory Visit: Payer: Medicare Other | Admitting: Physical Therapy

## 2012-12-06 ENCOUNTER — Ambulatory Visit: Payer: Medicare Other | Admitting: Physical Therapy

## 2012-12-08 ENCOUNTER — Ambulatory Visit: Payer: Medicare Other | Admitting: Physical Therapy

## 2012-12-12 ENCOUNTER — Encounter: Payer: Medicare Other | Admitting: Physical Therapy

## 2012-12-15 ENCOUNTER — Ambulatory Visit: Payer: Medicare Other | Admitting: Physical Therapy

## 2012-12-20 ENCOUNTER — Ambulatory Visit: Payer: Medicare Other | Admitting: Physical Therapy

## 2012-12-23 ENCOUNTER — Ambulatory Visit: Payer: Medicare Other | Attending: Orthopaedic Surgery | Admitting: Physical Therapy

## 2012-12-23 DIAGNOSIS — IMO0001 Reserved for inherently not codable concepts without codable children: Secondary | ICD-10-CM | POA: Insufficient documentation

## 2012-12-23 DIAGNOSIS — M25569 Pain in unspecified knee: Secondary | ICD-10-CM | POA: Insufficient documentation

## 2012-12-23 DIAGNOSIS — R262 Difficulty in walking, not elsewhere classified: Secondary | ICD-10-CM | POA: Insufficient documentation

## 2012-12-23 DIAGNOSIS — M25669 Stiffness of unspecified knee, not elsewhere classified: Secondary | ICD-10-CM | POA: Insufficient documentation

## 2012-12-23 DIAGNOSIS — Z96659 Presence of unspecified artificial knee joint: Secondary | ICD-10-CM | POA: Insufficient documentation

## 2012-12-26 ENCOUNTER — Ambulatory Visit: Payer: Medicare Other | Admitting: Physical Therapy

## 2012-12-29 ENCOUNTER — Ambulatory Visit: Payer: Medicare Other | Admitting: Physical Therapy

## 2013-01-02 ENCOUNTER — Ambulatory Visit: Payer: Medicare Other | Admitting: Physical Therapy

## 2013-01-05 ENCOUNTER — Ambulatory Visit: Payer: Medicare Other | Admitting: Physical Therapy

## 2013-04-05 ENCOUNTER — Encounter: Payer: Self-pay | Admitting: Gastroenterology

## 2013-04-24 ENCOUNTER — Encounter: Payer: Self-pay | Admitting: Gastroenterology

## 2013-05-20 ENCOUNTER — Emergency Department (HOSPITAL_BASED_OUTPATIENT_CLINIC_OR_DEPARTMENT_OTHER): Payer: Medicare Other

## 2013-05-20 ENCOUNTER — Encounter (HOSPITAL_BASED_OUTPATIENT_CLINIC_OR_DEPARTMENT_OTHER): Payer: Self-pay | Admitting: *Deleted

## 2013-05-20 ENCOUNTER — Emergency Department (HOSPITAL_BASED_OUTPATIENT_CLINIC_OR_DEPARTMENT_OTHER)
Admission: EM | Admit: 2013-05-20 | Discharge: 2013-05-21 | Disposition: A | Discharge status: Home / Self Care | Payer: Medicare Other | Attending: Emergency Medicine | Admitting: Emergency Medicine

## 2013-05-20 DIAGNOSIS — R0602 Shortness of breath: Secondary | ICD-10-CM | POA: Insufficient documentation

## 2013-05-20 DIAGNOSIS — K859 Acute pancreatitis without necrosis or infection, unspecified: Secondary | ICD-10-CM | POA: Insufficient documentation

## 2013-05-20 DIAGNOSIS — F17200 Nicotine dependence, unspecified, uncomplicated: Secondary | ICD-10-CM | POA: Insufficient documentation

## 2013-05-20 DIAGNOSIS — Z7982 Long term (current) use of aspirin: Secondary | ICD-10-CM | POA: Insufficient documentation

## 2013-05-20 DIAGNOSIS — Z860100 Personal history of colon polyps, unspecified: Secondary | ICD-10-CM | POA: Insufficient documentation

## 2013-05-20 DIAGNOSIS — Z87440 Personal history of urinary (tract) infections: Secondary | ICD-10-CM | POA: Insufficient documentation

## 2013-05-20 DIAGNOSIS — Z79899 Other long term (current) drug therapy: Secondary | ICD-10-CM | POA: Insufficient documentation

## 2013-05-20 DIAGNOSIS — R141 Gas pain: Secondary | ICD-10-CM | POA: Insufficient documentation

## 2013-05-20 DIAGNOSIS — R142 Eructation: Secondary | ICD-10-CM | POA: Insufficient documentation

## 2013-05-20 DIAGNOSIS — Z8719 Personal history of other diseases of the digestive system: Secondary | ICD-10-CM | POA: Insufficient documentation

## 2013-05-20 DIAGNOSIS — R1110 Vomiting, unspecified: Secondary | ICD-10-CM | POA: Insufficient documentation

## 2013-05-20 DIAGNOSIS — R143 Flatulence: Secondary | ICD-10-CM | POA: Insufficient documentation

## 2013-05-20 LAB — CBC WITH DIFFERENTIAL/PLATELET
Basophils Absolute: 0 10*3/uL (ref 0.0–0.1)
Basophils Relative: 0 % (ref 0–1)
HCT: 45.4 % (ref 39.0–52.0)
Hemoglobin: 16.4 g/dL (ref 13.0–17.0)
Lymphocytes Relative: 22 % (ref 12–46)
MCHC: 36.1 g/dL — ABNORMAL HIGH (ref 30.0–36.0)
Monocytes Absolute: 1.3 10*3/uL — ABNORMAL HIGH (ref 0.1–1.0)
Neutro Abs: 9.1 10*3/uL — ABNORMAL HIGH (ref 1.7–7.7)
Neutrophils Relative %: 66 % (ref 43–77)
RDW: 13.9 % (ref 11.5–15.5)
WBC: 13.7 10*3/uL — ABNORMAL HIGH (ref 4.0–10.5)

## 2013-05-20 MED ORDER — ONDANSETRON HCL 4 MG/2ML IJ SOLN
4.0000 mg | Freq: Once | INTRAMUSCULAR | Status: AC
  Administered 2013-05-20: 4 mg via INTRAVENOUS
  Filled 2013-05-20: qty 2

## 2013-05-20 MED ORDER — SODIUM CHLORIDE 0.9 % IV SOLN
INTRAVENOUS | Status: DC
  Administered 2013-05-20: via INTRAVENOUS

## 2013-05-20 MED ORDER — HYDROMORPHONE HCL PF 1 MG/ML IJ SOLN
1.0000 mg | Freq: Once | INTRAMUSCULAR | Status: AC
  Administered 2013-05-20: 1 mg via INTRAVENOUS
  Filled 2013-05-20: qty 1

## 2013-05-20 NOTE — ED Notes (Signed)
Pt has had upper abd and lower abd pain for past week. C/o "cold sweats" fevers unknown. Hx of pancreatitis. C/o feeling sob that started 2 weeks ago. States this is getting worse. States he has had "sinus drainage" denies any n/v today. Describes abd pain as sharp and constant.

## 2013-05-20 NOTE — ED Provider Notes (Signed)
History  This chart was scribed for Hanley Seamen, MD by Ardelia Mems, ED Scribe. This patient was seen in room MH06/MH06 and the patient's care was started at 11:02 PM.   CSN: [id number]  Arrival date & time 05/20/13  2023     Chief Complaint  Patient presents with  . Abdominal Pain     The history is provided by the patient. No language interpreter was used.    HPI Comments: Benjamin Romero is a 62 y.o. male with a history of pancreatitis, colon polyps and diverticulosis who presents to the Emergency Department complaining of sharp, constant, severe epigastric and suprapubic abdominal pain of 1 weeks duration. Pt states that abdominal pain is exacerbated by deep inspiration and movement. Pt is having trouble characterizing pain and is not sure if it is like his previous pancreatitis pain. Pt reports single episode of emesis 3 days ago. Pt denies nausea at this time. Friend in the room states that pt's abdomen feels distended and has not had a bowel movement in 3-4 days. Pt reports that he has been having cold sweats and sinus drainage. Pt also reports that he has had SOB that started 2 weeks ago and is getting worse. Pt has been taking 1 hydrocodone a day for his pain with little relief. Pt is an occasional alcohol user and a current every day smoker of 1 pack/day.  Past Medical History  Diagnosis Date  . Acute pancreatitis   . Colon polyps   . Diverticulosis   . Pancreatitis   . E-coli UTI 11-02-12    1 month ago- due to diet reducing med used-all clear  . Sleep apnea     no cpap used,dx. 2009    Past Surgical History  Procedure Laterality Date  . Back surgery    . Knee surgery    . Eus  12/31/2011    Procedure: UPPER ENDOSCOPIC ULTRASOUND (EUS) LINEAR;  Surgeon: Rob Bunting, MD;  Location: [facility];  Service: Endoscopy;  Laterality: N/A;  radial linear  . Total knee arthroplasty  11/03/2012    Procedure: TOTAL KNEE ARTHROPLASTY;  Surgeon: Kathryne Hitch, MD;   Location: [facility];  Service: Orthopedics;  Laterality: Left;  Left Total Knee Arthroplasty    Family History  Problem Relation Age of Onset  . Thyroid cancer Mother     History  Substance Use Topics  . Smoking status: Current Every Day Smoker -- 1.00 packs/day    Types: Cigarettes    Last Attempt to Quit: 12/22/2011  . Smokeless tobacco: Never Used  . Alcohol Use: Yes     Comment: rare use      Review of Systems  Constitutional: Negative for fever and chills.  HENT: Negative for neck pain.   Respiratory: Positive for shortness of breath.   Gastrointestinal: Positive for vomiting, abdominal pain and abdominal distention. Negative for nausea.  Musculoskeletal: Negative for back pain.  Neurological: Negative for headaches.  All other systems reviewed and are negative.    Allergies  Review of patient's allergies indicates no known allergies.  Home Medications   Current Outpatient Rx  Name  Route  Sig  Dispense  Refill  . HYDROcodone-acetaminophen (NORCO/VICODIN) 5-325 MG per tablet   Oral   Take 1 tablet by mouth every 6 (six) hours as needed for pain.         . Ascorbic Acid (VITAMIN C) 1000 MG tablet   Oral   Take 1,000 mg by mouth daily.         Marland Kitchen  aspirin EC 325 MG tablet   Oral   Take 1 tablet (325 mg total) by mouth 2 (two) times daily.   60 tablet   0   . atorvastatin (LIPITOR) 10 MG tablet   Oral   Take 10 mg by mouth daily with breakfast.         . B Complex-C (B-COMPLEX WITH VITAMIN C) tablet   Oral   Take 1 tablet by mouth daily.         . fish oil-omega-3 fatty acids 1000 MG capsule   Oral   Take 1 g by mouth daily.          . meclizine (ANTIVERT) 25 MG tablet   Oral   Take 25 mg by mouth as needed. Dizziness         . methocarbamol (ROBAXIN) 500 MG tablet   Oral   Take 1 tablet (500 mg total) by mouth every 6 (six) hours as needed.   60 tablet   0     Triage Vitals: BP 154/95  Pulse 71  Temp(Src) 97.9 F (36.6 C) (Oral)   Resp 18  Ht 6' (1.829 m)  Wt 264 lb (119.75 kg)  BMI 35.8 kg/m2  SpO2 98%  Physical Exam  Nursing note and vitals reviewed. Constitutional: He is oriented to person, place, and time. He appears well-developed and well-nourished.  HENT:  Head: Normocephalic and atraumatic.  Eyes: EOM are normal. Pupils are equal, round, and reactive to light.  Neck: Normal range of motion. Neck supple. No tracheal deviation present.  Cardiovascular: Normal rate, regular rhythm and normal heart sounds.   No murmur heard. Pulmonary/Chest: Effort normal and breath sounds normal. No respiratory distress.  Abdominal: Soft. Bowel sounds are normal.  Epigastric tenderness.  Mild suprapubic tenderness.  Abdomen soft and mildly distended. Normal bowel sounds    Musculoskeletal:  Normal pulses in extremities. No edema. Some pain with movement of left knee with well healed surgical incision.  Neurological: He is alert and oriented to person, place, and time.  Skin: Skin is warm. No rash noted.  Psychiatric: He has a normal mood and affect.    ED Course  Procedures (including critical care time)  DIAGNOSTIC STUDIES: Oxygen Saturation is 98% on RA, normal by my interpretation.    COORDINATION OF CARE: 11:16 PM- Pt advised of plan for treatment and pt agrees.  Medications  0.9 %  sodium chloride infusion ( Intravenous New Bag/Given 05/20/13 2332)  ondansetron (ZOFRAN) injection 4 mg (4 mg Intravenous Given 05/20/13 2332)  HYDROmorphone (DILAUDID) injection 1 mg (1 mg Intravenous Given 05/20/13 2333)     Results for orders placed during the hospital encounter of 05/20/13  COMPREHENSIVE METABOLIC PANEL      Result Value Range   Sodium 139  135 - 145 mEq/L   Potassium 4.5  3.5 - 5.1 mEq/L   Chloride 101  96 - 112 mEq/L   CO2 26  19 - 32 mEq/L   Glucose, Bld 121 (*) 70 - 99 mg/dL   BUN 11  6 - 23 mg/dL   Creatinine, Ser 1.61  0.50 - 1.35 mg/dL   Calcium 09.6  8.4 - 04.5 mg/dL   Total Protein  8.0  6.0 - 8.3 g/dL   Albumin 3.8  3.5 - 5.2 g/dL   AST 14  0 - 37 U/L   ALT 13  0 - 53 U/L   Alkaline Phosphatase 81  39 - 117 U/L   Total Bilirubin 0.4  0.3 - 1.2 mg/dL   GFR calc non Af Amer 70 (*) >90 mL/min   GFR calc Af Amer 81 (*) >90 mL/min  LIPASE, BLOOD      Result Value Range   Lipase 122 (*) 11 - 59 U/L  CBC WITH DIFFERENTIAL      Result Value Range   WBC 13.7 (*) 4.0 - 10.5 K/uL   RBC 5.17  4.22 - 5.81 MIL/uL   Hemoglobin 16.4  13.0 - 17.0 g/dL   HCT 13.2  44.0 - 10.2 %   MCV 87.8  78.0 - 100.0 fL   MCH 31.7  26.0 - 34.0 pg   MCHC 36.1 (*) 30.0 - 36.0 g/dL   RDW 72.5  36.6 - 44.0 %   Platelets 293  150 - 400 K/uL   Neutrophils Relative % 66  43 - 77 %   Neutro Abs 9.1 (*) 1.7 - 7.7 K/uL   Lymphocytes Relative 22  12 - 46 %   Lymphs Abs 3.1  0.7 - 4.0 K/uL   Monocytes Relative 9  3 - 12 %   Monocytes Absolute 1.3 (*) 0.1 - 1.0 K/uL   Eosinophils Relative 2  0 - 5 %   Eosinophils Absolute 0.3  0.0 - 0.7 K/uL   Basophils Relative 0  0 - 1 %   Basophils Absolute 0.0  0.0 - 0.1 K/uL  URINALYSIS, ROUTINE W REFLEX MICROSCOPIC      Result Value Range   Color, Urine YELLOW  YELLOW   APPearance CLEAR  CLEAR   Specific Gravity, Urine 1.010  1.005 - 1.030   pH 6.5  5.0 - 8.0   Glucose, UA NEGATIVE  NEGATIVE mg/dL   Hgb urine dipstick SMALL (*) NEGATIVE   Bilirubin Urine NEGATIVE  NEGATIVE   Ketones, ur NEGATIVE  NEGATIVE mg/dL   Protein, ur NEGATIVE  NEGATIVE mg/dL   Urobilinogen, UA 0.2  0.0 - 1.0 mg/dL   Nitrite NEGATIVE  NEGATIVE   Leukocytes, UA SMALL (*) NEGATIVE  URINE MICROSCOPIC-ADD ON      Result Value Range   Squamous Epithelial / LPF FEW (*) RARE   WBC, UA 3-6  <3 WBC/hpf   RBC / HPF 0-2  <3 RBC/hpf   Bacteria, UA MANY (*) RARE   Dg Chest 2 View  05/21/2013   *RADIOLOGY REPORT*  Clinical Data: Shortness of breath.  Upper abdominal pain.  CHEST - 2 VIEW  Comparison:  None.  Findings:  The heart size and mediastinal contours are within normal limits.   Both lungs are clear.  The visualized skeletal structures are unremarkable.  IMPRESSION: No active cardiopulmonary disease.   Original Report Authenticated By: Myles Rosenthal, M.D.       MDM   Nursing notes and vitals signs, including pulse oximetry, reviewed.  Summary of this visit's results, reviewed by myself:  Labs:  Results for orders placed during the hospital encounter of 05/20/13 (from the past 24 hour(s))  COMPREHENSIVE METABOLIC PANEL     Status: Abnormal   Collection Time    05/20/13 11:35 PM      Result Value Range   Sodium 139  135 - 145 mEq/L   Potassium 4.5  3.5 - 5.1 mEq/L   Chloride 101  96 - 112 mEq/L   CO2 26  19 - 32 mEq/L   Glucose, Bld 121 (*) 70 - 99 mg/dL   BUN 11  6 - 23 mg/dL   Creatinine, Ser 3.47  0.50 - 1.35  mg/dL   Calcium 32.4  8.4 - 40.1 mg/dL   Total Protein 8.0  6.0 - 8.3 g/dL   Albumin 3.8  3.5 - 5.2 g/dL   AST 14  0 - 37 U/L   ALT 13  0 - 53 U/L   Alkaline Phosphatase 81  39 - 117 U/L   Total Bilirubin 0.4  0.3 - 1.2 mg/dL   GFR calc non Af Amer 70 (*) >90 mL/min   GFR calc Af Amer 81 (*) >90 mL/min  LIPASE, BLOOD     Status: Abnormal   Collection Time    05/20/13 11:35 PM      Result Value Range   Lipase 122 (*) 11 - 59 U/L  CBC WITH DIFFERENTIAL     Status: Abnormal   Collection Time    05/20/13 11:35 PM      Result Value Range   WBC 13.7 (*) 4.0 - 10.5 K/uL   RBC 5.17  4.22 - 5.81 MIL/uL   Hemoglobin 16.4  13.0 - 17.0 g/dL   HCT 02.7  25.3 - 66.4 %   MCV 87.8  78.0 - 100.0 fL   MCH 31.7  26.0 - 34.0 pg   MCHC 36.1 (*) 30.0 - 36.0 g/dL   RDW 40.3  47.4 - 25.9 %   Platelets 293  150 - 400 K/uL   Neutrophils Relative % 66  43 - 77 %   Neutro Abs 9.1 (*) 1.7 - 7.7 K/uL   Lymphocytes Relative 22  12 - 46 %   Lymphs Abs 3.1  0.7 - 4.0 K/uL   Monocytes Relative 9  3 - 12 %   Monocytes Absolute 1.3 (*) 0.1 - 1.0 K/uL   Eosinophils Relative 2  0 - 5 %   Eosinophils Absolute 0.3  0.0 - 0.7 K/uL   Basophils Relative 0  0 - 1 %    Basophils Absolute 0.0  0.0 - 0.1 K/uL  URINALYSIS, ROUTINE W REFLEX MICROSCOPIC     Status: Abnormal   Collection Time    05/20/13 11:45 PM      Result Value Range   Color, Urine YELLOW  YELLOW   APPearance CLEAR  CLEAR   Specific Gravity, Urine 1.010  1.005 - 1.030   pH 6.5  5.0 - 8.0   Glucose, UA NEGATIVE  NEGATIVE mg/dL   Hgb urine dipstick SMALL (*) NEGATIVE   Bilirubin Urine NEGATIVE  NEGATIVE   Ketones, ur NEGATIVE  NEGATIVE mg/dL   Protein, ur NEGATIVE  NEGATIVE mg/dL   Urobilinogen, UA 0.2  0.0 - 1.0 mg/dL   Nitrite NEGATIVE  NEGATIVE   Leukocytes, UA SMALL (*) NEGATIVE  URINE MICROSCOPIC-ADD ON     Status: Abnormal   Collection Time    05/20/13 11:45 PM      Result Value Range   Squamous Epithelial / LPF FEW (*) RARE   WBC, UA 3-6  <3 WBC/hpf   RBC / HPF 0-2  <3 RBC/hpf   Bacteria, UA MANY (*) RARE    Imaging Studies: Dg Chest 2 View  05/21/2013   *RADIOLOGY REPORT*  Clinical Data: Shortness of breath.  Upper abdominal pain.  CHEST - 2 VIEW  Comparison:  None.  Findings:  The heart size and mediastinal contours are within normal limits.  Both lungs are clear.  The visualized skeletal structures are unremarkable.  IMPRESSION: No active cardiopulmonary disease.   Original Report Authenticated By: Myles Rosenthal, M.D.      EKG Interpretation:  Date & Time: 05/20/2013 9:04 PM  Rate: 71  Rhythm: normal sinus rhythm  QRS Axis: normal  Intervals: normal  ST/T Wave abnormalities: nonspecific T wave changes  Conduction Disutrbances:none  Narrative Interpretation:   Old EKG Reviewed: none available  1:10 AM In relieved by the medication. Patient states he is ready to go home. He has a followup appointment or to schedule with his gastroenterologist, Dr. Arlyce Dice, 3 days from today.    I personally performed the services described in this documentation, which was scribed in my presence.  The recorded information has been reviewed and is accurate.        Hanley Seamen, MD 05/21/13 0110

## 2013-05-21 LAB — URINALYSIS, ROUTINE W REFLEX MICROSCOPIC
Nitrite: NEGATIVE
Specific Gravity, Urine: 1.01 (ref 1.005–1.030)
Urobilinogen, UA: 0.2 mg/dL (ref 0.0–1.0)
pH: 6.5 (ref 5.0–8.0)

## 2013-05-21 LAB — COMPREHENSIVE METABOLIC PANEL
Albumin: 3.8 g/dL (ref 3.5–5.2)
Alkaline Phosphatase: 81 U/L (ref 39–117)
BUN: 11 mg/dL (ref 6–23)
Creatinine, Ser: 1.1 mg/dL (ref 0.50–1.35)
GFR calc Af Amer: 81 mL/min — ABNORMAL LOW (ref >90)
Glucose, Bld: 121 mg/dL — ABNORMAL HIGH (ref 70–99)
Total Bilirubin: 0.4 mg/dL (ref 0.3–1.2)
Total Protein: 8 g/dL (ref 6.0–8.3)

## 2013-05-21 LAB — URINE MICROSCOPIC-ADD ON

## 2013-05-21 LAB — LIPASE, BLOOD: Lipase: 122 U/L — ABNORMAL HIGH (ref 11–59)

## 2013-05-21 MED ORDER — ONDANSETRON 8 MG PO TBDP: 8.0000 mg | ORAL_TABLET | Freq: Three times a day (TID) | ORAL | Status: DC | PRN

## 2013-05-21 MED ORDER — HYDROMORPHONE HCL 2 MG PO TABS: 4.0000 mg | ORAL_TABLET | ORAL | Status: DC | PRN

## 2013-05-21 MED ORDER — LORAZEPAM 2 MG/ML IJ SOLN
1.0000 mg | Freq: Once | INTRAMUSCULAR | Status: AC
  Administered 2013-05-21: 1 mg via INTRAVENOUS
  Filled 2013-05-21: qty 1

## 2013-05-23 ENCOUNTER — Ambulatory Visit (AMBULATORY_SURGERY_CENTER): Payer: Medicare Other

## 2013-05-23 VITALS — Ht 72.0 in | Wt 263.4 lb

## 2013-05-23 DIAGNOSIS — Z1211 Encounter for screening for malignant neoplasm of colon: Secondary | ICD-10-CM

## 2013-05-23 DIAGNOSIS — Z86010 Personal history of colonic polyps: Secondary | ICD-10-CM

## 2013-05-23 LAB — URINE CULTURE

## 2013-05-23 MED ORDER — NA SULFATE-K SULFATE-MG SULF 17.5-3.13-1.6 GM/177ML PO SOLN: 1.0000 | Freq: Once | ORAL | Status: DC

## 2013-05-24 ENCOUNTER — Telehealth (HOSPITAL_COMMUNITY): Payer: Self-pay | Admitting: Emergency Medicine

## 2013-05-24 NOTE — Progress Notes (Signed)
ED Antimicrobial Stewardship Positive Culture Follow Up   Benjamin Romero is an 62 y.o. male who presented to [facility] on 05/20/2013 with a chief complaint of sharp constant epigastric and suprapubic abdominal pain x 1 week  Chief Complaint  Patient presents with  . Abdominal Pain    Recent Results (from the past 720 hour(s))  URINE CULTURE     Status: None   Collection Time    05/20/13 11:45 PM      Result Value Range Status   Specimen Description URINE, CLEAN CATCH   Final   Special Requests NONE   Final   Culture  Setup Time 05/21/2013 02:39   Final   Colony Count >=100,000 COLONIES/ML   Final   Culture ESCHERICHIA COLI   Final   Report Status 05/23/2013 FINAL   Final   Organism ID, Bacteria ESCHERICHIA COLI   Final    []  Treated with , organism resistant to prescribed antimicrobial [x]  Patient discharged originally without antimicrobial agent and treatment is now indicated  New antibiotic prescription: Ceftin 250 mg bid x 10 days  ED Provider: Wynetta Emery, PA-C  Benjamin Romero 05/24/2013, 10:30 AM Infectious Diseases Pharmacist Phone# [phone]

## 2013-05-24 NOTE — Telephone Encounter (Signed)
Post ED Visit - Positive Culture Follow-up: Successful Patient Follow-Up  Culture assessed and recommendations reviewed by: []  Wes Dulaney, Pharm.D., BCPS []  Celedonio Miyamoto, Pharm.D., BCPS [x]  Georgina Pillion, Pharm.D., BCPS []  [place], [address].D., BCPS, AAHIVP []  Estella Husk, Pharm.D., BCPS, AAHIVP  Positive urine culture  [x]  Patient discharged without antimicrobial prescription and treatment is now indicated []  Organism is resistant to prescribed ED discharge antimicrobial []  Patient with positive blood cultures  Changes discussed with ED provider: Wynetta Emery PA  New antibiotic prescription Ceftin 250 mg PO BID x ten days [phone]  Contacted patient, date 05/24/13, time 1643   Jiles Harold 05/24/2013, 4:43 PM

## 2013-06-02 ENCOUNTER — Telehealth: Payer: Self-pay | Admitting: Gastroenterology

## 2013-06-05 NOTE — Telephone Encounter (Signed)
no

## 2013-06-06 ENCOUNTER — Encounter: Payer: Medicare Other | Admitting: Gastroenterology

## 2013-07-20 ENCOUNTER — Encounter: Payer: Medicare Other | Admitting: Gastroenterology

## 2013-09-20 ENCOUNTER — Encounter: Payer: Self-pay | Admitting: Gastroenterology

## 2013-09-20 ENCOUNTER — Telehealth: Payer: Self-pay | Admitting: Gastroenterology

## 2013-09-20 ENCOUNTER — Ambulatory Visit (AMBULATORY_SURGERY_CENTER): Payer: Medicare Other | Admitting: Gastroenterology

## 2013-09-20 VITALS — BP 153/86 | HR 58 | Temp 96.0°F | Resp 17 | Ht 72.0 in | Wt 263.0 lb

## 2013-09-20 DIAGNOSIS — K5730 Diverticulosis of large intestine without perforation or abscess without bleeding: Secondary | ICD-10-CM

## 2013-09-20 DIAGNOSIS — Z86010 Personal history of colonic polyps: Secondary | ICD-10-CM

## 2013-09-20 MED ORDER — SODIUM CHLORIDE 0.9 % IV SOLN: 500.0000 mL | INTRAVENOUS | Status: DC

## 2013-09-20 NOTE — Telephone Encounter (Signed)
Dr Christella Hartigan, Dr Arlyce Dice called me this morning to get this pt an appt with you for dysphagia.  It looks like his pt?  Is there something I am not seeing?

## 2013-09-20 NOTE — Op Note (Signed)
[facility] [address] [place], [address]   COLONOSCOPY PROCEDURE REPORT  PATIENT: Benjamin Romero, Benjamin Romero  MR#: [id number] BIRTHDATE: [birth year] , 62  yrs. old GENDER: Male ENDOSCOPIST: Louis Meckel, MD REFERRED HQ:IONGE Cloward, M.D. PROCEDURE DATE:  09/20/2013 PROCEDURE:   Colonoscopy, diagnostic First Screening Colonoscopy - Avg.  risk and is 50 yrs.  old or older - No.  Prior Negative Screening - Now for repeat screening. N/A  History of Adenoma - Now for follow-up colonoscopy & has been > or = to 3 yrs.  Yes hx of adenoma.  Has been 3 or more years since last colonoscopy.  Polyps Removed Today? No.  Recommend repeat exam, <10 yrs? No. ASA CLASS:   Class II INDICATIONS:Patient's personal history of colon polyps 2009 MEDICATIONS: MAC sedation, administered by CRNA and propofol (Diprivan) 250mg  IV  DESCRIPTION OF PROCEDURE:   After the risks benefits and alternatives of the procedure were thoroughly explained, informed consent was obtained.  A digital rectal exam revealed no abnormalities of the rectum.   The LB [id number]  endoscope was introduced through the anus and advanced to the cecum, which was identified by both the appendix and ileocecal valve. No adverse events experienced.   The quality of the prep was excellent using Suprep  The instrument was then slowly withdrawn as the colon was fully examined.      COLON FINDINGS: Mild diverticulosis was noted in the ascending colon and at the cecum.   The colon mucosa was otherwise normal. Retroflexed views revealed no abnormalities. The time to cecum=3 minutes 0 seconds.  Withdrawal time=8 minutes 05 seconds.  The scope was withdrawn and the procedure completed. COMPLICATIONS: There were no complications.  ENDOSCOPIC IMPRESSION: 1.   Mild diverticulosis was noted in the ascending colon and at the cecum 2.   The colon mucosa was otherwise normal  RECOMMENDATIONS: Colonoscopy 10  years   eSigned:  Louis Meckel, MD 09/20/2013 8:36 AM   cc:   PATIENT NAME:  Benjamin Romero, Benjamin Romero MR#: [id number]

## 2013-09-20 NOTE — Telephone Encounter (Signed)
The ER told me that this was a pt of Dr. Christella Hartigan.  Sorry for the confusion.  Zella Ball, he needs an OV

## 2013-09-20 NOTE — Patient Instructions (Addendum)
YOU HAD AN ENDOSCOPIC PROCEDURE TODAY AT THE  ENDOSCOPY CENTER: Refer to the procedure report that was given to you for any specific questions about what was found during the examination.  If the procedure report does not answer your questions, please call your gastroenterologist to clarify.  If you requested that your care partner not be given the details of your procedure findings, then the procedure report has been included in a sealed envelope for you to review at your convenience later.  YOU SHOULD EXPECT: Some feelings of bloating in the abdomen. Passage of more gas than usual.  Walking can help get rid of the air that was put into your GI tract during the procedure and reduce the bloating. If you had a lower endoscopy (such as a colonoscopy or flexible sigmoidoscopy) you may notice spotting of blood in your stool or on the toilet paper. If you underwent a bowel prep for your procedure, then you may not have a normal bowel movement for a few days.  DIET: Your first meal following the procedure should be a light meal and then it is ok to progress to your normal diet.  A half-sandwich or bowl of soup is an example of a good first meal.  Heavy or fried foods are harder to digest and may make you feel nauseous or bloated.  Likewise meals heavy in dairy and vegetables can cause extra gas to form and this can also increase the bloating.  Drink plenty of fluids but you should avoid alcoholic beverages for 24 hours.  ACTIVITY: Your care partner should take you home directly after the procedure.  You should plan to take it easy, moving slowly for the rest of the day.  You can resume normal activity the day after the procedure however you should NOT DRIVE or use heavy machinery for 24 hours (because of the sedation medicines used during the test).    SYMPTOMS TO REPORT IMMEDIATELY: A gastroenterologist can be reached at any hour.  During normal business hours, 8:30 AM to 5:00 PM Monday through Friday,  call [phone].  After hours and on weekends, please call the GI answering service at [phone] who will take a message and have the physician on call contact you.   Following lower endoscopy (colonoscopy or flexible sigmoidoscopy):  Excessive amounts of blood in the stool  Significant tenderness or worsening of abdominal pains  Swelling of the abdomen that is new, acute  Fever of 100F or higher  FOLLOW UP: Our staff will call the home number listed on your records the next business day following your procedure to check on you and address any questions or concerns that you may have at that time regarding the information given to you following your procedure. This is a courtesy call and so if there is no answer at the home number and we have not heard from you through the emergency physician on call, we will assume that you have returned to your regular daily activities without incident.  SIGNATURES/CONFIDENTIALITY: You and/or your care partner have signed paperwork which will be entered into your electronic medical record.  These signatures attest to the fact that that the information above on your After Visit Summary has been reviewed and is understood.  Full responsibility of the confidentiality of this discharge information lies with you and/or your care-partner.  Please read over information about diverticulosis and high fiber diets  Continue your current medications  Follow up colonoscopy in 10 years

## 2013-09-20 NOTE — Progress Notes (Signed)
Lidocaine-40mg IV prior to Propofol InductionPropofol given over incremental dosages 

## 2013-09-20 NOTE — Telephone Encounter (Signed)
Message copied by Marlowe Kays on Wed Sep 20, 2013  3:35 PM ------      Message from: Melvia Heaps D      Created: Wed Sep 20, 2013 11:25 AM       Zella Ball,      This patient  was in the ER last night complaining of dysphagia.  Please schedule him for an office visit. ------

## 2013-09-20 NOTE — Progress Notes (Signed)
Patient did not experience any of the following events: a burn prior to discharge; a fall within the facility; wrong site/side/patient/procedure/implant event; or a hospital transfer or hospital admission upon discharge from the facility. (G8907) Patient did not have preoperative order for IV antibiotic SSI prophylaxis. (G8918)  

## 2013-09-21 ENCOUNTER — Telehealth: Payer: Self-pay | Admitting: *Deleted

## 2013-09-21 NOTE — Telephone Encounter (Signed)
Left message that we called for f/u 

## 2013-12-28 ENCOUNTER — Other Ambulatory Visit (HOSPITAL_COMMUNITY): Payer: Self-pay | Admitting: Orthopaedic Surgery

## 2013-12-29 ENCOUNTER — Encounter (HOSPITAL_COMMUNITY): Payer: Self-pay | Admitting: Pharmacy Technician

## 2014-01-03 ENCOUNTER — Encounter (HOSPITAL_COMMUNITY): Payer: Self-pay

## 2014-01-03 ENCOUNTER — Encounter (HOSPITAL_COMMUNITY)
Admission: RE | Admit: 2014-01-03 | Discharge: 2014-01-03 | Disposition: A | Discharge status: Home / Self Care | Payer: Medicare Other | Source: Ambulatory Visit | Attending: Orthopaedic Surgery | Admitting: Orthopaedic Surgery

## 2014-01-03 LAB — BASIC METABOLIC PANEL
BUN: 10 mg/dL (ref 6–23)
CHLORIDE: 100 meq/L (ref 96–112)
CO2: 25 mEq/L (ref 19–32)
Calcium: 9.5 mg/dL (ref 8.4–10.5)
Creatinine, Ser: 1 mg/dL (ref 0.50–1.35)
GFR calc Af Amer: 90 mL/min — ABNORMAL LOW (ref >90)
GFR calc non Af Amer: 78 mL/min — ABNORMAL LOW (ref >90)
GLUCOSE: 101 mg/dL — AB (ref 70–99)
Potassium: 4.5 mEq/L (ref 3.7–5.3)
Sodium: 140 mEq/L (ref 137–147)

## 2014-01-03 LAB — CBC
HEMATOCRIT: 46.6 % (ref 39.0–52.0)
Hemoglobin: 17 g/dL (ref 13.0–17.0)
MCH: 32.3 pg (ref 26.0–34.0)
MCHC: 36.5 g/dL — AB (ref 30.0–36.0)
MCV: 88.6 fL (ref 78.0–100.0)
Platelets: 277 10*3/uL (ref 150–400)
RBC: 5.26 MIL/uL (ref 4.22–5.81)
RDW: 13.8 % (ref 11.5–15.5)
WBC: 11.4 10*3/uL — ABNORMAL HIGH (ref 4.0–10.5)

## 2014-01-03 LAB — URINALYSIS, ROUTINE W REFLEX MICROSCOPIC
BILIRUBIN URINE: NEGATIVE
GLUCOSE, UA: NEGATIVE mg/dL
KETONES UR: NEGATIVE mg/dL
Nitrite: POSITIVE — AB
PROTEIN: NEGATIVE mg/dL
Specific Gravity, Urine: 1.016 (ref 1.005–1.030)
UROBILINOGEN UA: 0.2 mg/dL (ref 0.0–1.0)
pH: 6 (ref 5.0–8.0)

## 2014-01-03 LAB — PROTIME-INR
INR: 0.98 (ref 0.00–1.49)
Prothrombin Time: 12.8 seconds (ref 11.6–15.2)

## 2014-01-03 LAB — URINE MICROSCOPIC-ADD ON

## 2014-01-03 LAB — SURGICAL PCR SCREEN
MRSA, PCR: NEGATIVE
Staphylococcus aureus: POSITIVE — AB

## 2014-01-03 LAB — APTT: APTT: 33 s (ref 24–37)

## 2014-01-03 NOTE — Progress Notes (Signed)
01-03-14 1510 Pt. Has viewable labs in Epic. [facility] [phone]  for Positive Staph aureus PCR screen. Please note urinalysis done.

## 2014-01-03 NOTE — Pre-Procedure Instructions (Signed)
01-03-14 1515 Pt. And Dr. Eliberto IvoryBlackman's office made aware of Positive PCR screen-Staph aureus and -Dr. Magnus IvanBlackman aware of urinalysis report-viewable in Epic-with culture pending.W. Kennon PortelaHendrick,RN

## 2014-01-03 NOTE — Patient Instructions (Addendum)
20 Benjamin MottWalter A Romero  01/03/2014   Your procedure is scheduled on:  1-16 -2015  [facility]     0530   AM .  Call this number if you have problems the morning of surgery: [phone]  Or Presurgical Testing [phone](Benjamin Romero) For Living Will and/or Health Care Power Attorney Forms: please provide copy for your medical record,may bring AM of surgery(Forms should be already notarized -we do not provide this service).     Do not eat food:After Midnight.    Take these medicines the morning of surgery with A SIP OF WATER: none   Do not wear jewelry, make-up or nail polish.  Do not wear lotions, powders, or perfumes. You may wear deodorant.  Do not shave 12 hours prior to first CHG shower(legs and under arms).(face and neck okay.)  Do not bring valuables to the hospital.(Hospital is not responsible for lost valuables).  Contacts, dentures or removable bridgework, body piercing, hair pins may not be worn into surgery.  Leave suitcase in the car. After surgery it may be brought to your room.  For patients admitted to the hospital, checkout time is 11:00 AM the day of discharge.(Restricted visitors-Persons, age 318 or younger - may not visit at this time.)    Patients discharged the day of surgery will not be allowed to drive home. Must have responsible person with you x 24 hours once discharged.  Name and phone number of your driver: Benjamin GordonMarylynn Romero-spouse [phone]- [phone] cell  Special Instructions: CHG(Chlorhedine 4%-"Hibiclens","Betasept","Aplicare") Shower Use Special Wash: see special instructions.(avoid face and genitals)   Please read over the following fact sheets that you were given: MRSA Information, Blood Transfusion fact sheet, Incentive Spirometry Instruction.  Remember : Type/Screen "Blue armbands" - may not be removed once applied(would result in being retested AM of surgery, if removed).  Failure to follow these instructions may result in Cancellation of  your surgery.   Patient signature_______________________________________________________

## 2014-01-04 MED ORDER — DEXTROSE 5 % IV SOLN
3.0000 g | INTRAVENOUS | Status: AC
  Administered 2014-01-05: 3 g via INTRAVENOUS
  Filled 2014-01-04 (×2): qty 3000

## 2014-01-04 NOTE — Anesthesia Preprocedure Evaluation (Addendum)
Anesthesia Evaluation  Patient identified by MRN, date of birth, ID band Patient awake    Reviewed: Allergy & Precautions, H&P , NPO status , Patient's Chart, lab work & pertinent test results  Airway Mallampati: III TM Distance: >3 FB Neck ROM: full    Dental  (+) Chipped, Dental Advisory Given and Poor Dentition All upper front teeth are broken off:   Pulmonary neg pulmonary ROS, sleep apnea , Current Smoker,  History sleep apnea 2009 breath sounds clear to auscultation  Pulmonary exam normal       Cardiovascular Exercise Tolerance: Good negative cardio ROS  Rhythm:regular Rate:Normal     Neuro/Psych negative neurological ROS  negative psych ROS   GI/Hepatic negative GI ROS, Neg liver ROS, Acute pancreatitis 2014   Endo/Other  negative endocrine ROSMorbid obesity  Renal/GU negative Renal ROS  negative genitourinary   Musculoskeletal   Abdominal (+) + obese,   Peds  Hematology negative hematology ROS (+)   Anesthesia Other Findings   Reproductive/Obstetrics negative OB ROS                         Anesthesia Physical Anesthesia Plan  ASA: II  Anesthesia Plan: General   Post-op Pain Management:    Induction: Intravenous  Airway Management Planned: Oral ETT  Additional Equipment:   Intra-op Plan:   Post-operative Plan: Extubation in OR  Informed Consent: I have reviewed the patients History and Physical, chart, labs and discussed the procedure including the risks, benefits and alternatives for the proposed anesthesia with the patient or authorized representative who has indicated his/her understanding and acceptance.   Dental Advisory Given  Plan Discussed with: CRNA and Surgeon  Anesthesia Plan Comments:         Anesthesia Quick Evaluation

## 2014-01-05 ENCOUNTER — Encounter (HOSPITAL_COMMUNITY): Payer: Medicare Other | Admitting: Anesthesiology

## 2014-01-05 ENCOUNTER — Encounter (HOSPITAL_COMMUNITY)
Admission: RE | Disposition: A | Discharge status: Home Health | Payer: Self-pay | Source: Ambulatory Visit | Attending: Orthopaedic Surgery

## 2014-01-05 ENCOUNTER — Inpatient Hospital Stay (HOSPITAL_COMMUNITY)
Admission: RE | Admit: 2014-01-05 | Discharge: 2014-01-06 | DRG: 489 | Disposition: A | Discharge status: Home Health | Payer: Medicare Other | Source: Ambulatory Visit | Attending: Orthopaedic Surgery | Admitting: Orthopaedic Surgery

## 2014-01-05 ENCOUNTER — Inpatient Hospital Stay (HOSPITAL_COMMUNITY): Payer: Medicare Other

## 2014-01-05 ENCOUNTER — Inpatient Hospital Stay (HOSPITAL_COMMUNITY): Payer: Medicare Other | Admitting: Anesthesiology

## 2014-01-05 ENCOUNTER — Encounter (HOSPITAL_COMMUNITY): Payer: Self-pay | Admitting: *Deleted

## 2014-01-05 DIAGNOSIS — E669 Obesity, unspecified: Secondary | ICD-10-CM | POA: Diagnosis present

## 2014-01-05 DIAGNOSIS — G2581 Restless legs syndrome: Secondary | ICD-10-CM | POA: Diagnosis present

## 2014-01-05 DIAGNOSIS — Y831 Surgical operation with implant of artificial internal device as the cause of abnormal reaction of the patient, or of later complication, without mention of misadventure at the time of the procedure: Secondary | ICD-10-CM | POA: Diagnosis present

## 2014-01-05 DIAGNOSIS — Z01812 Encounter for preprocedural laboratory examination: Secondary | ICD-10-CM

## 2014-01-05 DIAGNOSIS — M1710 Unilateral primary osteoarthritis, unspecified knee: Secondary | ICD-10-CM | POA: Diagnosis present

## 2014-01-05 DIAGNOSIS — T84069A Wear of articular bearing surface of unspecified internal prosthetic joint, initial encounter: Principal | ICD-10-CM | POA: Diagnosis present

## 2014-01-05 DIAGNOSIS — Z7982 Long term (current) use of aspirin: Secondary | ICD-10-CM

## 2014-01-05 DIAGNOSIS — Z96659 Presence of unspecified artificial knee joint: Secondary | ICD-10-CM

## 2014-01-05 DIAGNOSIS — M25469 Effusion, unspecified knee: Secondary | ICD-10-CM | POA: Diagnosis present

## 2014-01-05 DIAGNOSIS — Z87440 Personal history of urinary (tract) infections: Secondary | ICD-10-CM

## 2014-01-05 DIAGNOSIS — T84063A Wear of articular bearing surface of internal prosthetic left knee joint, initial encounter: Secondary | ICD-10-CM

## 2014-01-05 DIAGNOSIS — Z79899 Other long term (current) drug therapy: Secondary | ICD-10-CM

## 2014-01-05 DIAGNOSIS — M25462 Effusion, left knee: Secondary | ICD-10-CM

## 2014-01-05 DIAGNOSIS — Z6836 Body mass index (BMI) 36.0-36.9, adult: Secondary | ICD-10-CM

## 2014-01-05 DIAGNOSIS — Z23 Encounter for immunization: Secondary | ICD-10-CM

## 2014-01-05 DIAGNOSIS — G4733 Obstructive sleep apnea (adult) (pediatric): Secondary | ICD-10-CM | POA: Diagnosis present

## 2014-01-05 DIAGNOSIS — F17200 Nicotine dependence, unspecified, uncomplicated: Secondary | ICD-10-CM | POA: Diagnosis present

## 2014-01-05 HISTORY — PX: TOTAL KNEE REVISION: SHX996

## 2014-01-05 LAB — GRAM STAIN

## 2014-01-05 LAB — SYNOVIAL CELL COUNT + DIFF, W/ CRYSTALS
Crystals, Fluid: NONE SEEN
EOSINOPHILS-SYNOVIAL: 0 % (ref 0–1)
LYMPHOCYTES-SYNOVIAL FLD: 71 % — AB (ref 0–20)
Monocyte-Macrophage-Synovial Fluid: 11 % — ABNORMAL LOW (ref 50–90)
NEUTROPHIL, SYNOVIAL: 11 % (ref 0–25)
OTHER CELLS-SYN: 0
WBC, Synovial: 404 /mm3 — ABNORMAL HIGH (ref 0–200)

## 2014-01-05 LAB — PATHOLOGIST SMEAR REVIEW

## 2014-01-05 LAB — TYPE AND SCREEN
ABO/RH(D): B POS
Antibody Screen: NEGATIVE

## 2014-01-05 LAB — URINE CULTURE: Colony Count: >=100000

## 2014-01-05 SURGERY — TOTAL KNEE REVISION
Anesthesia: General | Site: Knee | Laterality: Left

## 2014-01-05 MED ORDER — 0.9 % SODIUM CHLORIDE (POUR BTL) OPTIME
TOPICAL | Status: DC | PRN
  Administered 2014-01-05: 1000 mL

## 2014-01-05 MED ORDER — ROPIVACAINE HCL 5 MG/ML IJ SOLN
INTRAMUSCULAR | Status: AC
  Filled 2014-01-05: qty 30

## 2014-01-05 MED ORDER — LIDOCAINE HCL (CARDIAC) 20 MG/ML IV SOLN
INTRAVENOUS | Status: DC | PRN
  Administered 2014-01-05: 100 mg via INTRAVENOUS

## 2014-01-05 MED ORDER — ROPIVACAINE HCL 5 MG/ML IJ SOLN
INTRAMUSCULAR | Status: DC | PRN
  Administered 2014-01-05: 30 mL via PERINEURAL

## 2014-01-05 MED ORDER — ONDANSETRON HCL 4 MG/2ML IJ SOLN
INTRAMUSCULAR | Status: AC
  Filled 2014-01-05: qty 2

## 2014-01-05 MED ORDER — PHENOL 1.4 % MT LIQD
1.0000 | OROMUCOSAL | Status: DC | PRN
  Filled 2014-01-05: qty 177

## 2014-01-05 MED ORDER — ONDANSETRON HCL 4 MG PO TABS: 4.0000 mg | ORAL_TABLET | Freq: Four times a day (QID) | ORAL | Status: DC | PRN

## 2014-01-05 MED ORDER — CEFAZOLIN SODIUM-DEXTROSE 2-3 GM-% IV SOLR
2.0000 g | Freq: Four times a day (QID) | INTRAVENOUS | Status: AC
  Administered 2014-01-05 (×2): 2 g via INTRAVENOUS
  Filled 2014-01-05 (×2): qty 50

## 2014-01-05 MED ORDER — INFLUENZA VAC SPLIT QUAD 0.5 ML IM SUSP
0.5000 mL | INTRAMUSCULAR | Status: AC
  Administered 2014-01-06: 0.5 mL via INTRAMUSCULAR
  Filled 2014-01-05 (×2): qty 0.5

## 2014-01-05 MED ORDER — LACTATED RINGERS IV SOLN
INTRAVENOUS | Status: DC | PRN
  Administered 2014-01-05 (×2): via INTRAVENOUS

## 2014-01-05 MED ORDER — PROPOFOL 10 MG/ML IV BOLUS
INTRAVENOUS | Status: DC | PRN
  Administered 2014-01-05: 300 mg via INTRAVENOUS

## 2014-01-05 MED ORDER — PROPOFOL 10 MG/ML IV BOLUS
INTRAVENOUS | Status: AC
  Filled 2014-01-05: qty 20

## 2014-01-05 MED ORDER — SODIUM CHLORIDE 0.9 % IR SOLN
Status: DC | PRN
  Administered 2014-01-05: 3000 mL

## 2014-01-05 MED ORDER — HYDROMORPHONE HCL PF 1 MG/ML IJ SOLN
0.2500 mg | INTRAMUSCULAR | Status: DC | PRN
  Administered 2014-01-05 (×4): 0.5 mg via INTRAVENOUS

## 2014-01-05 MED ORDER — ZOLPIDEM TARTRATE 5 MG PO TABS
5.0000 mg | ORAL_TABLET | Freq: Every evening | ORAL | Status: DC | PRN
Start: 2014-01-05 — End: 2014-01-06

## 2014-01-05 MED ORDER — HYDROMORPHONE HCL PF 1 MG/ML IJ SOLN
1.0000 mg | INTRAMUSCULAR | Status: DC | PRN
Start: 2014-01-05 — End: 2014-01-06

## 2014-01-05 MED ORDER — ASPIRIN EC 325 MG PO TBEC
325.0000 mg | DELAYED_RELEASE_TABLET | Freq: Two times a day (BID) | ORAL | Status: DC
  Administered 2014-01-06: 325 mg via ORAL
  Filled 2014-01-05 (×3): qty 1

## 2014-01-05 MED ORDER — PHENYLEPHRINE HCL 10 MG/ML IJ SOLN
INTRAMUSCULAR | Status: DC | PRN
  Administered 2014-01-05 (×2): 40 ug via INTRAVENOUS

## 2014-01-05 MED ORDER — MENTHOL 3 MG MT LOZG
1.0000 | LOZENGE | OROMUCOSAL | Status: DC | PRN
  Filled 2014-01-05: qty 9

## 2014-01-05 MED ORDER — HYDROMORPHONE HCL PF 1 MG/ML IJ SOLN
INTRAMUSCULAR | Status: AC
  Filled 2014-01-05: qty 1

## 2014-01-05 MED ORDER — B COMPLEX-C PO TABS
1.0000 | ORAL_TABLET | Freq: Every day | ORAL | Status: DC
  Administered 2014-01-06: 1 via ORAL
  Filled 2014-01-05: qty 1

## 2014-01-05 MED ORDER — DIPHENHYDRAMINE HCL 12.5 MG/5ML PO ELIX: 12.5000 mg | ORAL_SOLUTION | ORAL | Status: DC | PRN

## 2014-01-05 MED ORDER — MIDAZOLAM HCL 2 MG/2ML IJ SOLN
INTRAMUSCULAR | Status: AC
  Filled 2014-01-05: qty 2

## 2014-01-05 MED ORDER — METOCLOPRAMIDE HCL 5 MG PO TABS
5.0000 mg | ORAL_TABLET | Freq: Three times a day (TID) | ORAL | Status: DC | PRN
  Filled 2014-01-05: qty 2

## 2014-01-05 MED ORDER — DOCUSATE SODIUM 100 MG PO CAPS
100.0000 mg | ORAL_CAPSULE | Freq: Two times a day (BID) | ORAL | Status: DC
  Administered 2014-01-05 – 2014-01-06 (×3): 100 mg via ORAL

## 2014-01-05 MED ORDER — ACETAMINOPHEN 325 MG PO TABS: 650.0000 mg | ORAL_TABLET | Freq: Four times a day (QID) | ORAL | Status: DC | PRN

## 2014-01-05 MED ORDER — NITROFURANTOIN MONOHYD MACRO 100 MG PO CAPS
100.0000 mg | ORAL_CAPSULE | Freq: Two times a day (BID) | ORAL | Status: DC
  Administered 2014-01-05 – 2014-01-06 (×3): 100 mg via ORAL
  Filled 2014-01-05 (×5): qty 1

## 2014-01-05 MED ORDER — OXYCODONE HCL 5 MG PO TABS
5.0000 mg | ORAL_TABLET | ORAL | Status: DC | PRN
  Administered 2014-01-05: 10 mg via ORAL
  Filled 2014-01-05: qty 2

## 2014-01-05 MED ORDER — PNEUMOCOCCAL VAC POLYVALENT 25 MCG/0.5ML IJ INJ
0.5000 mL | INJECTION | INTRAMUSCULAR | Status: AC
  Administered 2014-01-06: 0.5 mL via INTRAMUSCULAR
  Filled 2014-01-05 (×2): qty 0.5

## 2014-01-05 MED ORDER — MIDAZOLAM HCL 5 MG/5ML IJ SOLN
INTRAMUSCULAR | Status: DC | PRN
  Administered 2014-01-05 (×2): 1 mg via INTRAVENOUS

## 2014-01-05 MED ORDER — METOCLOPRAMIDE HCL 5 MG/ML IJ SOLN: 5.0000 mg | Freq: Three times a day (TID) | INTRAMUSCULAR | Status: DC | PRN

## 2014-01-05 MED ORDER — CIPROFLOXACIN IN D5W 400 MG/200ML IV SOLN
400.0000 mg | Freq: Two times a day (BID) | INTRAVENOUS | Status: DC
  Administered 2014-01-05: 400 mg via INTRAVENOUS

## 2014-01-05 MED ORDER — ONDANSETRON HCL 4 MG/2ML IJ SOLN
INTRAMUSCULAR | Status: DC | PRN
  Administered 2014-01-05: 4 mg via INTRAVENOUS

## 2014-01-05 MED ORDER — LACTATED RINGERS IV SOLN: INTRAVENOUS | Status: DC

## 2014-01-05 MED ORDER — DEXAMETHASONE SODIUM PHOSPHATE 10 MG/ML IJ SOLN
INTRAMUSCULAR | Status: DC | PRN
  Administered 2014-01-05: 10 mg via INTRAVENOUS

## 2014-01-05 MED ORDER — FENTANYL CITRATE 0.05 MG/ML IJ SOLN
INTRAMUSCULAR | Status: AC
  Filled 2014-01-05: qty 5

## 2014-01-05 MED ORDER — DEXAMETHASONE SODIUM PHOSPHATE 10 MG/ML IJ SOLN
INTRAMUSCULAR | Status: AC
  Filled 2014-01-05: qty 1

## 2014-01-05 MED ORDER — OXYCODONE HCL ER 10 MG PO T12A
10.0000 mg | EXTENDED_RELEASE_TABLET | Freq: Two times a day (BID) | ORAL | Status: DC
  Administered 2014-01-05 – 2014-01-06 (×3): 10 mg via ORAL
  Filled 2014-01-05 (×3): qty 1

## 2014-01-05 MED ORDER — PHENYLEPHRINE 40 MCG/ML (10ML) SYRINGE FOR IV PUSH (FOR BLOOD PRESSURE SUPPORT)
PREFILLED_SYRINGE | INTRAVENOUS | Status: AC
  Filled 2014-01-05: qty 10

## 2014-01-05 MED ORDER — SUCCINYLCHOLINE CHLORIDE 20 MG/ML IJ SOLN
INTRAMUSCULAR | Status: DC | PRN
  Administered 2014-01-05: 160 mg via INTRAVENOUS

## 2014-01-05 MED ORDER — METHOCARBAMOL 500 MG PO TABS: 500.0000 mg | ORAL_TABLET | Freq: Four times a day (QID) | ORAL | Status: DC | PRN

## 2014-01-05 MED ORDER — SODIUM CHLORIDE 0.9 % IV SOLN
INTRAVENOUS | Status: DC
  Administered 2014-01-05: 11:00:00 via INTRAVENOUS

## 2014-01-05 MED ORDER — ESMOLOL HCL 10 MG/ML IV SOLN
INTRAVENOUS | Status: AC
  Filled 2014-01-05: qty 10

## 2014-01-05 MED ORDER — ONDANSETRON HCL 4 MG/2ML IJ SOLN: 4.0000 mg | Freq: Four times a day (QID) | INTRAMUSCULAR | Status: DC | PRN

## 2014-01-05 MED ORDER — ALUM & MAG HYDROXIDE-SIMETH 200-200-20 MG/5ML PO SUSP: 30.0000 mL | ORAL | Status: DC | PRN

## 2014-01-05 MED ORDER — HYDROMORPHONE HCL PF 2 MG/ML IJ SOLN
INTRAMUSCULAR | Status: AC
  Filled 2014-01-05: qty 1

## 2014-01-05 MED ORDER — FENTANYL CITRATE 0.05 MG/ML IJ SOLN
INTRAMUSCULAR | Status: DC | PRN
  Administered 2014-01-05 (×2): 100 ug via INTRAVENOUS
  Administered 2014-01-05: 50 ug via INTRAVENOUS

## 2014-01-05 MED ORDER — ACETAMINOPHEN 650 MG RE SUPP: 650.0000 mg | Freq: Four times a day (QID) | RECTAL | Status: DC | PRN

## 2014-01-05 MED ORDER — POLYETHYLENE GLYCOL 3350 17 G PO PACK: 17.0000 g | PACK | Freq: Every day | ORAL | Status: DC | PRN

## 2014-01-05 MED ORDER — METHOCARBAMOL 100 MG/ML IJ SOLN
500.0000 mg | Freq: Four times a day (QID) | INTRAVENOUS | Status: DC | PRN
  Administered 2014-01-05: 500 mg via INTRAVENOUS
  Filled 2014-01-05: qty 5

## 2014-01-05 MED ORDER — LIDOCAINE HCL (CARDIAC) 20 MG/ML IV SOLN
INTRAVENOUS | Status: AC
  Filled 2014-01-05: qty 5

## 2014-01-05 MED ORDER — HYDROMORPHONE HCL PF 1 MG/ML IJ SOLN
INTRAMUSCULAR | Status: DC | PRN
  Administered 2014-01-05 (×2): 0.5 mg via INTRAVENOUS

## 2014-01-05 SURGICAL SUPPLY — 55 items
BAG ZIPLOCK 12X15 (MISCELLANEOUS) ×3 IMPLANT
BANDAGE ELASTIC 4 VELCRO ST LF (GAUZE/BANDAGES/DRESSINGS) IMPLANT
BANDAGE ELASTIC 6 VELCRO ST LF (GAUZE/BANDAGES/DRESSINGS) ×6 IMPLANT
BANDAGE ESMARK 6X9 LF (GAUZE/BANDAGES/DRESSINGS) ×1 IMPLANT
BLADE SAG 18X100X1.27 (BLADE) IMPLANT
BLADE SAW SGTL 13.0X1.19X90.0M (BLADE) IMPLANT
BNDG ESMARK 6X9 LF (GAUZE/BANDAGES/DRESSINGS) ×3
CONT SPECI 4OZ STER CLIK (MISCELLANEOUS) IMPLANT
CUFF TOURN SGL QUICK 34 (TOURNIQUET CUFF) ×2
CUFF TRNQT CYL 34X4X40X1 (TOURNIQUET CUFF) ×1 IMPLANT
DERMABOND ADVANCED (GAUZE/BANDAGES/DRESSINGS)
DERMABOND ADVANCED .7 DNX12 (GAUZE/BANDAGES/DRESSINGS) IMPLANT
DRAPE EXTREMITY T 121X128X90 (DRAPE) ×3 IMPLANT
DRAPE POUCH INSTRU U-SHP 10X18 (DRAPES) ×3 IMPLANT
DRAPE U-SHAPE 47X51 STRL (DRAPES) ×3 IMPLANT
DRSG AQUACEL AG ADV 3.5X10 (GAUZE/BANDAGES/DRESSINGS) IMPLANT
DRSG PAD ABDOMINAL 8X10 ST (GAUZE/BANDAGES/DRESSINGS) ×3 IMPLANT
DRSG TEGADERM 4X4.75 (GAUZE/BANDAGES/DRESSINGS) IMPLANT
DURAPREP 26ML APPLICATOR (WOUND CARE) ×3 IMPLANT
ELECT REM PT RETURN 9FT ADLT (ELECTROSURGICAL) ×3
ELECTRODE REM PT RTRN 9FT ADLT (ELECTROSURGICAL) ×1 IMPLANT
EVACUATOR 1/8 PVC DRAIN (DRAIN) IMPLANT
FACESHIELD LNG OPTICON STERILE (SAFETY) ×12 IMPLANT
GAUZE SPONGE 2X2 8PLY STRL LF (GAUZE/BANDAGES/DRESSINGS) IMPLANT
GAUZE XEROFORM 5X9 LF (GAUZE/BANDAGES/DRESSINGS) ×3 IMPLANT
GLOVE BIO SURGEON STRL SZ7.5 (GLOVE) ×6 IMPLANT
GLOVE BIOGEL PI IND STRL 8 (GLOVE) ×2 IMPLANT
GLOVE BIOGEL PI INDICATOR 8 (GLOVE) ×4
GLOVE ECLIPSE 8.0 STRL XLNG CF (GLOVE) ×3 IMPLANT
GOWN STRL REUS W/TWL XL LVL3 (GOWN DISPOSABLE) ×12 IMPLANT
HANDPIECE INTERPULSE COAX TIP (DISPOSABLE) ×2
IMMOBILIZER KNEE 20 (SOFTGOODS) ×3 IMPLANT
KIT BASIN OR (CUSTOM PROCEDURE TRAY) ×3 IMPLANT
NS IRRIG 1000ML POUR BTL (IV SOLUTION) ×3 IMPLANT
PACK TOTAL JOINT (CUSTOM PROCEDURE TRAY) ×3 IMPLANT
PAD ABD 8X10 STRL (GAUZE/BANDAGES/DRESSINGS) IMPLANT
PADDING CAST COTTON 6X4 STRL (CAST SUPPLIES) ×6 IMPLANT
POSITIONER SURGICAL ARM (MISCELLANEOUS) ×3 IMPLANT
SET HNDPC FAN SPRY TIP SCT (DISPOSABLE) ×1 IMPLANT
SET PAD KNEE POSITIONER (MISCELLANEOUS) ×3 IMPLANT
SPONGE GAUZE 2X2 STER 10/PKG (GAUZE/BANDAGES/DRESSINGS)
SPONGE GAUZE 4X4 12PLY (GAUZE/BANDAGES/DRESSINGS) ×3 IMPLANT
SPONGE LAP 18X18 X RAY DECT (DISPOSABLE) IMPLANT
STAPLER VISISTAT 35W (STAPLE) ×3 IMPLANT
SUCTION FRAZIER 12FR DISP (SUCTIONS) ×3 IMPLANT
SUT VIC AB 0 CT1 36 (SUTURE) ×3 IMPLANT
SUT VIC AB 1 CT1 36 (SUTURE) ×6 IMPLANT
SUT VIC AB 2-0 CT1 27 (SUTURE) ×4
SUT VIC AB 2-0 CT1 TAPERPNT 27 (SUTURE) ×2 IMPLANT
SUT VICRYL 4-0 (SUTURE) IMPLANT
SWAB COLLECTION DEVICE MRSA (MISCELLANEOUS) ×3 IMPLANT
TOWEL OR 17X26 10 PK STRL BLUE (TOWEL DISPOSABLE) ×9 IMPLANT
TRAY FOLEY CATH 16FR SILVER (SET/KITS/TRAYS/PACK) ×3 IMPLANT
TUBE ANAEROBIC SPECIMEN COL (MISCELLANEOUS) ×3 IMPLANT
WATER STERILE IRR 1500ML POUR (IV SOLUTION) ×3 IMPLANT

## 2014-01-05 NOTE — Brief Op Note (Signed)
01/05/2014  9:00 AM  PATIENT:  Benjamin Romero  63 y.o. male  PRE-OPERATIVE DIAGNOSIS:  painful left total knee arthroplasty  POST-OPERATIVE DIAGNOSIS:  painful left total knee arthroplasty  PROCEDURE:  Procedure(s): LEFT TOTAL KNEE POLY EXCHANGE (Left)  SURGEON:  Surgeon(s) and Role:    * Kathryne Hitchhristopher Y Hero Mccathern, MD - Primary  PHYSICIAN ASSISTANT: Rexene EdisonGil Clark, PA-C  ANESTHESIA:   regional and general  EBL:  Total I/O In: 1000 [I.V.:1000] Out: 270 [Urine:250; Blood:20]  BLOOD ADMINISTERED:none  DRAINS: none   LOCAL MEDICATIONS USED:  NONE  SPECIMEN:  No Specimen  DISPOSITION OF SPECIMEN:  N/A  COUNTS:  YES  TOURNIQUET:   Total Tourniquet Time Documented: Thigh (Left) - 43 minutes Total: Thigh (Left) - 43 minutes   DICTATION: .Other Dictation: Dictation Number [id number]  PLAN OF CARE: Admit to inpatient   PATIENT DISPOSITION:  PACU - hemodynamically stable.   Delay start of Pharmacological VTE agent (>24hrs) due to surgical blood loss or risk of bleeding: no

## 2014-01-05 NOTE — Care Management Note (Signed)
  Page 1 of 1   01/05/2014     3:31:44 PM   CARE MANAGEMENT NOTE 01/05/2014  Patient:  Benjamin Romero,Benjamin Romero   Account Number:  [id number]  Date Initiated:  01/05/2014  Documentation initiated by:  Colleen CanMANNING,Brandonlee Navis  Subjective/Objective Assessment:   dx painmful left knee arethroplasty; revision left knee arthroplaty     Action/Plan:   Surgery today. pt/ot pending. CM will follow   Anticipated DC Date:  01/08/2014   Anticipated DC Plan:  HOME W HOME HEALTH SERVICES      DC Planning Services  CM consult      Choice offered to / List presented to:             Status of service:  In process, will continue to follow Medicare Important Message given?   (If response is "NO", the following Medicare IM given date fields will be blank) Date Medicare IM given:   Date Additional Medicare IM given:    Discharge Disposition:    Per UR Regulation:  Reviewed for med. necessity/level of care/duration of stay  If discussed at Long Length of Stay Meetings, dates discussed:    Comments:

## 2014-01-05 NOTE — Anesthesia Postprocedure Evaluation (Signed)
  Anesthesia Post-op Note  Patient: Benjamin Romero  Procedure(s) Performed: Procedure(s) (LRB): LEFT TOTAL KNEE POLY EXCHANGE (Left)  Patient Location: PACU  Anesthesia Type: GA combined with regional for post-op pain  Level of Consciousness: awake and alert   Airway and Oxygen Therapy: Patient Spontanous Breathing  Post-op Pain: mild  Post-op Assessment: Post-op Vital signs reviewed, Patient's Cardiovascular Status Stable, Respiratory Function Stable, Patent Airway and No signs of Nausea or vomiting  Last Vitals:  Filed Vitals:   01/05/14 0945  BP: 142/74  Pulse: 75  Temp:   Resp: 11    Post-op Vital Signs: stable   Complications: No apparent anesthesia complications

## 2014-01-05 NOTE — H&P (Signed)
TOTAL KNEE REVISION ADMISSION H&P  Patient is being admitted for left revision total knee arthroplasty.  Subjective:  Chief Complaint:left knee pain.  HPI: Benjamin Romero, 63 y.o. male, has a history of pain and functional disability in the left knee(s) due to recurrent effusions and patient has failed non-surgical conservative treatments for greater than 12 weeks to include NSAID's and/or analgesics, corticosteriod injections and activity modification. The indications for the revision of the total knee arthroplasty are bearing surface wear leading to symptomatic synovitis. Onset of symptoms was gradual starting 1 years ago with gradually worsening course since that time.  Patient currently rates pain in the left knee(s) at 7 out of 10 with activity. There is worsening of pain with activity and weight bearing.  This condition presents safety issues increasing the risk of falls.   Patient Active Problem List   Diagnosis Date Noted  . Effusion of left knee joint 01/05/2014  . Arthritis of knee, left 11/03/2012  . Nonspecific (abnormal) findings on radiological and other examination of gastrointestinal tract 12/31/2011  . Unspecified gastritis and gastroduodenitis without mention of hemorrhage 12/31/2011  . Duodenitis 12/31/2011  . Personal history of colonic polyps 11/10/2011  . RESTLESS LEG SYNDROME 03/01/2008  . OBESITY 02/09/2008  . OBSTRUCTIVE SLEEP APNEA 02/09/2008  . Acute pancreatitis 01/30/2008   Past Medical History  Diagnosis Date  . Acute pancreatitis 05/20/13    In [facility]  . Colon polyps   . Diverticulosis   . Pancreatitis   . E-coli UTI 11-02-12    1 month ago- due to diet reducing med used-all clear  . Gas   . Abdominal pain   . Sleep apnea     denies-no cpap used,dx. 2009    Past Surgical History  Procedure Laterality Date  . Knee surgery  bil  . Eus  12/31/2011    Procedure: UPPER ENDOSCOPIC ULTRASOUND (EUS) LINEAR;  Surgeon: Rob Bunting, MD;   Location: [facility] ENDOSCOPY;  Service: Endoscopy;  Laterality: N/A;  radial linear  . Total knee arthroplasty  11/03/2012    Procedure: TOTAL KNEE ARTHROPLASTY;  Surgeon: Kathryne Hitch, MD;  Location: [facility] ORS;  Service: Orthopedics;  Laterality: Left;  Left Total Knee Arthroplasty  . Colonoscopy    . Back surgery      lumbar  . Hand surgery Left     repair traum cut left hand    Prescriptions prior to admission  Medication Sig Dispense Refill  . Ascorbic Acid (VITAMIN C) 1000 MG tablet Take 1,000 mg by mouth daily.      Marland Kitchen aspirin EC 325 MG tablet Take 1 tablet (325 mg total) by mouth 2 (two) times daily.  60 tablet  0  . B Complex-C (B-COMPLEX WITH VITAMIN C) tablet Take 1 tablet by mouth as needed.       . fish oil-omega-3 fatty acids 1000 MG capsule Take 1 g by mouth daily.        No Known Allergies  History  Substance Use Topics  . Smoking status: Current Every Day Smoker -- 0.25 packs/day    Types: Cigarettes  . Smokeless tobacco: Never Used     Comment: states 1 cig per day  . Alcohol Use: No     Comment: rare use    Family History  Problem Relation Age of Onset  . Thyroid cancer Mother       Review of Systems  Genitourinary: Positive for frequency.  All other systems reviewed and are negative.  Objective:  Physical Exam  Constitutional: He is oriented to person, place, and time. He appears well-developed and well-nourished.  HENT:  Head: Normocephalic and atraumatic.  Eyes: EOM are normal. Pupils are equal, round, and reactive to light.  Neck: Normal range of motion. Neck supple.  Cardiovascular: Normal rate and regular rhythm.   Respiratory: Effort normal and breath sounds normal.  GI: Soft. Bowel sounds are normal.  Musculoskeletal:       Left knee: He exhibits effusion.  Neurological: He is alert and oriented to person, place, and time. He has normal reflexes.  Skin: Skin is warm and dry.  Psychiatric: He has a normal mood and affect.    Vital  signs in last 24 hours: Temp:  [97.6 F (36.4 C)] 97.6 F (36.4 C) (01/16 0531) Pulse Rate:  [60-81] 64 (01/16 0724) Resp:  [18] 18 (01/16 0531) BP: (142)/(91) 142/91 mmHg (01/16 0531) SpO2:  [100 %] 100 % (01/16 0531)  Labs:  Estimated body mass index is 35.72 kg/(m^2) as calculated from the following:   Height as of 01/03/14: 6' (1.829 m).   Weight as of 05/23/13: 119.477 kg (263 lb 6.4 oz).  Imaging Review Plain radiographs demonstrate no gross loosening of the implants, but effusion is present  Assessment/Plan:  Recurrent effusions left total knee arthroplasty   He understands fully that we are proceeding to the OR for a synovectomy, poly exchange, and possible revision of the components pending our findings intra-operatively.  The patient history, physical examination, clinical judgment of the provider and imaging studies are consistent with end stage degenerative joint disease of the left knee(s), previous total knee arthroplasty. Revision total knee arthroplasty is deemed medically necessary. The treatment options including medical management, injection therapy, arthroscopy and revision arthroplasty were discussed at length. The risks and benefits of revision total knee arthroplasty were presented and reviewed. The risks due to aseptic loosening, infection, stiffness, patella tracking problems, thromboembolic complications and other imponderables were discussed. The patient acknowledged the explanation, agreed to proceed with the plan and consent was signed. Patient is being admitted for inpatient treatment for surgery, pain control, PT, OT, prophylactic antibiotics, VTE prophylaxis, progressive ambulation and ADL's and discharge planning.The patient is planning to be discharged home with home health services

## 2014-01-05 NOTE — Transfer of Care (Signed)
Immediate Anesthesia Transfer of Care Note  Patient: Benjamin Romero  Procedure(s) Performed: Procedure(s): LEFT TOTAL KNEE POLY EXCHANGE (Left)  Patient Location: PACU  Anesthesia Type:General  Level of Consciousness: sedated  Airway & Oxygen Therapy: Patient Spontanous Breathing and Patient connected to face mask oxygen  Post-op Assessment: Report given to PACU RN and Post -op Vital signs reviewed and stable  Post vital signs: Reviewed and stable  Complications: No apparent anesthesia complications

## 2014-01-05 NOTE — Anesthesia Procedure Notes (Signed)
Anesthesia Regional Block:  Femoral nerve block  Pre-Anesthetic Checklist: ,, timeout performed, Correct Patient, Correct Site, Correct Laterality, Correct Procedure, Correct Position, site marked, Risks and benefits discussed,  Surgical consent,  Pre-op evaluation,  At surgeon's request and post-op pain management  Laterality: Left  Prep: chloraprep       Needles:  Injection technique: Single-shot  Needle Type: Echogenic Stimulator Needle     Needle Length:cm 9 cm Needle Gauge: 20 and 20 G    Additional Needles:  Procedures: ultrasound guided (picture in chart) Femoral nerve block  Nerve Stimulator or Paresthesia:  Response: 0.5 mA,   Additional Responses:   Narrative:  Start time: 01/05/2014 7:15 AM End time: 01/05/2014 7:24 AM Injection made incrementally with aspirations every 5 mL.  Performed by: Personally  Anesthesiologist: Gaetano Hawthorneharles L Jlynn Langille MD  Additional Notes: Patient tolerated the procedure well without complications

## 2014-01-05 NOTE — Preoperative (Signed)
Beta Blockers   Reason not to administer Beta Blockers:Not Applicable 

## 2014-01-06 LAB — CBC
HEMATOCRIT: 42.5 % (ref 39.0–52.0)
HEMOGLOBIN: 14.5 g/dL (ref 13.0–17.0)
MCH: 30.8 pg (ref 26.0–34.0)
MCHC: 34.1 g/dL (ref 30.0–36.0)
MCV: 90.2 fL (ref 78.0–100.0)
Platelets: 273 10*3/uL (ref 150–400)
RBC: 4.71 MIL/uL (ref 4.22–5.81)
RDW: 14 % (ref 11.5–15.5)
WBC: 17.9 10*3/uL — ABNORMAL HIGH (ref 4.0–10.5)

## 2014-01-06 LAB — BASIC METABOLIC PANEL
BUN: 12 mg/dL (ref 6–23)
CO2: 22 mEq/L (ref 19–32)
CREATININE: 0.97 mg/dL (ref 0.50–1.35)
Calcium: 8.8 mg/dL (ref 8.4–10.5)
Chloride: 102 mEq/L (ref 96–112)
GFR calc non Af Amer: 85 mL/min — ABNORMAL LOW (ref >90)
GLUCOSE: 183 mg/dL — AB (ref 70–99)
Potassium: 4.3 mEq/L (ref 3.7–5.3)
Sodium: 138 mEq/L (ref 137–147)

## 2014-01-06 MED ORDER — NITROFURANTOIN MONOHYD MACRO 100 MG PO CAPS: 100.0000 mg | ORAL_CAPSULE | Freq: Two times a day (BID) | ORAL | Status: DC

## 2014-01-06 MED ORDER — OXYCODONE-ACETAMINOPHEN 5-325 MG PO TABS: 1.0000 | ORAL_TABLET | ORAL | Status: DC | PRN

## 2014-01-06 MED ORDER — METHOCARBAMOL 500 MG PO TABS: 500.0000 mg | ORAL_TABLET | Freq: Four times a day (QID) | ORAL | Status: DC | PRN

## 2014-01-06 NOTE — Discharge Summary (Signed)
Patient ID: VENUS RUHE MRN: [id number] DOB/AGE: 1951-11-03 63 y.o.  Admit date: 01/05/2014 Discharge date: 01/06/2014  Admission Diagnoses:  Principal Problem:   Effusion of left knee joint Active Problems:   Polyethylene wear of left knee prosthesis   Discharge Diagnoses:  Same  Past Medical History  Diagnosis Date  . Acute pancreatitis 05/20/13    In [facility]  . Colon polyps   . Diverticulosis   . Pancreatitis   . E-coli UTI 11-02-12    1 month ago- due to diet reducing med used-all clear  . Gas   . Abdominal pain   . Sleep apnea     denies-no cpap used,dx. 2009    Surgeries: Procedure(s): LEFT TOTAL KNEE POLY EXCHANGE on 01/05/2014   Consultants:    Discharged Condition: Improved  Hospital Course: WONG STEADHAM is an 63 y.o. male who was admitted 01/05/2014 for operative treatment ofEffusion of left knee joint. Patient has severe unremitting pain that affects sleep, daily activities, and work/hobbies. After pre-op clearance the patient was taken to the operating room on 01/05/2014 and underwent  Procedure(s): LEFT TOTAL KNEE POLY EXCHANGE.    Patient was given perioperative antibiotics: Anti-infectives   Start     Dose/Rate Route Frequency Ordered Stop   01/05/14 1400  ceFAZolin (ANCEF) IVPB 2 g/50 mL premix     2 g 100 mL/hr over 30 Minutes Intravenous Every 6 hours 01/05/14 1051 01/05/14 2042   01/05/14 1000  ciprofloxacin (CIPRO) IVPB 400 mg  Status:  Discontinued     400 mg 200 mL/hr over 60 Minutes Intravenous Every 12 hours 01/05/14 0800 01/05/14 1026   01/05/14 0600  ceFAZolin (ANCEF) 3 g in dextrose 5 % 50 mL IVPB     3 g 160 mL/hr over 30 Minutes Intravenous On call to O.R. 01/04/14 1848 01/05/14 0802       Patient was given sequential compression devices, early ambulation, and chemoprophylaxis to prevent DVT.  Patient benefited maximally from hospital stay and there were no complications.    Recent vital signs: Patient Vitals  for the past 24 hrs:  BP Temp Temp src Pulse Resp SpO2 Height Weight  01/06/14 0615 153/75 mmHg 97.7 F (36.5 C) Oral 85 20 95 % - -  01/06/14 0400 - - - - 20 96 % - -  01/06/14 0135 150/79 mmHg 97.9 F (36.6 C) Oral 71 20 97 % - -  01/05/14 2327 - - - - 20 - - -  01/05/14 2145 163/82 mmHg 98.3 F (36.8 C) Oral 77 20 99 % - -  01/05/14 2000 - - - - 20 95 % - -  01/05/14 1730 153/74 mmHg 98.1 F (36.7 C) Oral 94 16 99 % - -  01/05/14 1342 153/82 mmHg 98.5 F (36.9 C) Oral 99 16 100 % - -  01/05/14 1243 142/78 mmHg 97.5 F (36.4 C) Oral 94 14 98 % - -  01/05/14 1149 139/74 mmHg 97.8 F (36.6 C) Oral 87 16 94 % - -  01/05/14 1111 - - - - 16 - - -  01/05/14 1045 142/76 mmHg 98.4 F (36.9 C) - 58 - 98 % 6' (1.829 m) 122.471 kg (270 lb)  01/05/14 1030 - 97.7 F (36.5 C) - - - - - -  01/05/14 1015 117/76 mmHg 97.7 F (36.5 C) - 68 13 99 % - -  01/05/14 1000 129/85 mmHg - - 73 16 95 % - -  01/05/14 0945 142/74 mmHg - -  75 11 99 % - -  01/05/14 0930 138/91 mmHg 98.1 F (36.7 C) - 77 11 99 % - -  01/05/14 0915 136/78 mmHg 97.6 F (36.4 C) - 77 12 100 % - -     Recent laboratory studies:  Recent Labs  01/03/14 1045 01/06/14 0605  WBC 11.4* 17.9*  HGB 17.0 14.5  HCT 46.6 42.5  PLT 277 273  NA 140 138  K 4.5 4.3  CL 100 102  CO2 25 22  BUN 10 12  CREATININE 1.00 0.97  GLUCOSE 101* 183*  INR 0.98  --   CALCIUM 9.5 8.8     Discharge Medications:     Medication List         aspirin EC 325 MG tablet  Take 1 tablet (325 mg total) by mouth 2 (two) times daily.     B-complex with vitamin C tablet  Take 1 tablet by mouth as needed.     fish oil-omega-3 fatty acids 1000 MG capsule  Take 1 g by mouth daily.     methocarbamol 500 MG tablet  Commonly known as:  ROBAXIN  Take 1 tablet (500 mg total) by mouth every 6 (six) hours as needed for muscle spasms.     nitrofurantoin (macrocrystal-monohydrate) 100 MG capsule  Commonly known as:  MACROBID  Take 1 capsule (100  mg total) by mouth 2 (two) times daily.     oxyCODONE-acetaminophen 5-325 MG per tablet  Commonly known as:  ROXICET  Take 1-2 tablets by mouth every 4 (four) hours as needed for severe pain.     vitamin C 1000 MG tablet  Take 1,000 mg by mouth daily.        Diagnostic Studies: Dg Knee Left Port  01/05/2014   CLINICAL DATA:  Postop left knee arthroplasty.  EXAM: PORTABLE LEFT KNEE - 1-2 VIEW  COMPARISON:  11/03/2012  FINDINGS: Total left knee arthroplasty is appreciated. Hardware is intact. There is no evidence of loosening or failure. The native osseous structures are unremarkable. Postsurgical changes appreciated within the soft tissues of the left knee.  IMPRESSION: Patient is status post total left knee arthroplasty.   Electronically Signed   By: Salome HolmesHector  Cooper M.D.   On: 01/05/2014 09:43    Disposition: 01-Home or Self Care      Discharge Orders   Future Orders Complete By Expires   Call MD / Call 911  As directed    Comments:     If you experience chest pain or shortness of breath, CALL 911 and be transported to the hospital emergency room.  If you develope a fever above 101 F, pus (white drainage) or increased drainage or redness at the wound, or calf pain, call your surgeon's office.   Constipation Prevention  As directed    Comments:     Drink plenty of fluids.  Prune juice may be helpful.  You may use a stool softener, such as Colace (over the counter) 100 mg twice a day.  Use MiraLax (over the counter) for constipation as needed.   Diet - low sodium heart healthy  As directed    Discharge instructions  As directed    Comments:     Increase your activities as comfort allows. Full weight as tolerated left leg. Work on knee motion and strengthening. You can get your current dressing wet in the shower. You can get your actual incision wet in the shower starting 01/10/14   Discharge patient  As directed  Increase activity slowly as tolerated  As directed        Follow-up Information   Follow up with Kathryne Hitch, MD In 2 weeks.   Specialty:  Orthopedic Surgery   Contact information:   [address] [place] [address] [phone]        Signed: Kathryne Hitch 01/06/2014, 7:44 AM

## 2014-01-06 NOTE — Progress Notes (Signed)
Physical Therapy Treatment Patient Details Name: Benjamin Romero MRN: [id number] DOB: 1951/09/22 Today's Date: 01/06/2014 Time: [id number] PT Time Calculation (min): 30 min  PT Assessment / Plan / Recommendation  History of Present Illness revision L tka   PT Comments     Follow Up Recommendations  Home health PT     Does the patient have the potential to tolerate intense rehabilitation     Barriers to Discharge        Equipment Recommendations  None recommended by PT    Recommendations for Other Services    Frequency 7X/week   Progress towards PT Goals Progress towards PT goals: Progressing toward goals  Plan Current plan remains appropriate    Precautions / Restrictions Precautions Precautions: Fall Required Braces or Orthoses: Knee Immobilizer - Left Knee Immobilizer - Left: Discontinue once straight leg raise with < 10 degree lag   Pertinent Vitals/Pain     Mobility  Bed Mobility Overal bed mobility: Modified Independent Transfers Equipment used: Rolling walker (2 wheeled) General transfer comment: cues for safety Ambulation/Gait Ambulation/Gait assistance: Modified independent (Device/Increase time) Ambulation Distance (Feet): 400 Feet Assistive device: Rolling walker (2 wheeled) Gait Pattern/deviations: Step-to pattern General Gait Details: L knee buckled several times during ambulation    Exercises Total Joint Exercises Quad Sets: AROM;Left;10 reps Hip ABduction/ADduction: AROM;Left;10 reps Straight Leg Raises: AAROM;Left   PT Diagnosis: Difficulty walking  PT Problem List: Decreased strength;Decreased activity tolerance;Decreased knowledge of use of DME;Impaired sensation PT Treatment Interventions: DME instruction;Gait training;Functional mobility training;Therapeutic activities;Patient/family education   PT Goals (current goals can now be found in the care plan section) Acute Rehab PT Goals Patient Stated Goal: to walk without falling PT Goal  Formulation: With patient Time For Goal Achievement: 01/08/14 Potential to Achieve Goals: Good  Visit Information  Last PT Received On: 01/06/14 Assistance Needed: +1 History of Present Illness: revision L tka    Subjective Data  Patient Stated Goal: to walk without falling   Cognition  Cognition Arousal/Alertness: Awake/alert Behavior During Therapy: WFL for tasks assessed/performed Overall Cognitive Status: Within Functional Limits for tasks assessed    Balance     End of Session PT - End of Session Equipment Utilized During Treatment: Left knee immobilizer Activity Tolerance: Patient tolerated treatment well Patient left: in chair;with call bell/phone within reach;with nursing/sitter in room Nurse Communication: Mobility status   GP     Rada HayHill, Asaph Serena Elizabeth 01/06/2014, 12:24 PM

## 2014-01-06 NOTE — Evaluation (Signed)
Physical Therapy Evaluation Patient Details Name: Benjamin Romero MRN: [id number] DOB: 08-Jan-1951 Today's Date: 01/06/2014 Time: [id number] PT Time Calculation (min): 9 min  PT Assessment / Plan / Recommendation History of Present Illness  revision L tka  Clinical Impression  Pt's L leg remains partially numb from Nerve block. Pt instructed to wear knee immobilizer until he can perform SLR. Pt will benefit from PT to address problems.    PT Assessment  Patient needs continued PT services    Follow Up Recommendations  Home health PT    Does the patient have the potential to tolerate intense rehabilitation      Barriers to Discharge        Equipment Recommendations  None recommended by PT    Recommendations for Other Services     Frequency 7X/week    Precautions / Restrictions Precautions Precautions: Fall Required Braces or Orthoses: Knee Immobilizer - Left Knee Immobilizer - Left: Discontinue once straight leg raise with < 10 degree lag Restrictions Weight Bearing Restrictions: No   Pertinent Vitals/Pain none      Mobility  Bed Mobility Overal bed mobility: Modified Independent Transfers Equipment used: Rolling walker (2 wheeled) General transfer comment: cues for safety Ambulation/Gait Ambulation/Gait assistance: Supervision Ambulation Distance (Feet): 50 Feet Assistive device: Rolling walker (2 wheeled) Gait Pattern/deviations: Step-to pattern General Gait Details: L knee buckled several times during ambulation    Exercises     PT Diagnosis: Difficulty walking  PT Problem List: Decreased strength;Decreased activity tolerance;Decreased knowledge of use of DME;Impaired sensation PT Treatment Interventions: DME instruction;Gait training;Functional mobility training;Therapeutic activities;Patient/family education     PT Goals(Current goals can be found in the care plan section) Acute Rehab PT Goals Patient Stated Goal: to walk without falling PT Goal  Formulation: With patient Time For Goal Achievement: 01/08/14 Potential to Achieve Goals: Good  Visit Information  Last PT Received On: 01/06/14 Assistance Needed: +1 History of Present Illness: revision L tka       Prior Functioning  Home Living Family/patient expects to be discharged to:: Private residence Living Arrangements: Spouse/significant other Available Help at Discharge: Family Type of Home: Apartment Home Equipment: Dan HumphreysWalker - 2 wheels;Bedside commode;Crutches Prior Function Level of Independence: Independent    Cognition  Cognition Arousal/Alertness: Awake/alert Behavior During Therapy: WFL for tasks assessed/performed Overall Cognitive Status: Within Functional Limits for tasks assessed    Extremity/Trunk Assessment Upper Extremity Assessment Upper Extremity Assessment: Overall WFL for tasks assessed Lower Extremity Assessment Lower Extremity Assessment: LLE deficits/detail LLE Deficits / Details: L quads very weak, Nerve block still effective, knee is buckling with weight LLE Sensation: decreased light touch Cervical / Trunk Assessment Cervical / Trunk Assessment: Normal   Balance    End of Session PT - End of Session Activity Tolerance: Patient tolerated treatment well Patient left: in chair;with call bell/phone within reach;with nursing/sitter in room Nurse Communication: Mobility status CPM Left Knee CPM Left Knee: Off  GP     Rada HayHill, Yatziri Wainwright Elizabeth 01/06/2014, 10:07 AM

## 2014-01-06 NOTE — Op Note (Signed)
NAME:  Benjamin Romero, Benjamin Romero NO.:  [id number]  MEDICAL RECORD NO.:  [id number]  LOCATION:  1620                         FACILITY:  [facility]  PHYSICIAN:  Vanita Panda. Magnus Ivan, M.D.DATE OF BIRTH:  1951-11-20  DATE OF PROCEDURE:  01/05/2014 DATE OF DISCHARGE:                              OPERATIVE REPORT   PREOPERATIVE DIAGNOSIS:  Status post left total knee arthroplasty with recurrent effusions and symptoms of instability.  POSTOPERATIVE DIAGNOSIS:  Status post left total knee arthroplasty with recurrent effusions and symptoms of instability.  FINDINGS:  Ligamentous laxity, left knee with no gross evidence of infection and no gross evidence of loose components.  PROCEDURES:  Left knee irrigation and debridement, partial synovectomy, and polyethylene insert exchange to a thicker insert from 11 mm thickness insert to a 13 mm thickness.  SURGEON:  Vanita Panda. Magnus Ivan, M.D.  ASSISTANT:  Richardean Canal, P.A.  ANESTHESIA: 1. Left leg femoral nerve block. 2. General.  BLOOD LOSS:  Less than 100 mL.  TOURNIQUET TIME:  Less than 1 hour.  CULTURES:  Pending.  COMPLICATIONS:  None.  INDICATION:  Mr. Caraway is a 63 year old gentleman well known to me.  In November of 2013, he underwent successful primary knee replacement of arthritic left knee.  Postoperatively, he was able to get his motion going excellently and he is very active individual.  Over the course of year, he said there were times of recurrent effusion and on my exam, the knee feels like it has got more play and I did do a thicker polyethylene insert that I normally do, but he still had more of an anterior drawer sign that we did not recognize in the operating room.  Over a year now with those symptoms continuing, he has had x-rays that showed no evidence of mechanical loosening and effusions of getting less, but he still feels like the knee is somewhat unstable to him.  We talked to him about partial  synovectomy of his knee and a poly exchange to a thicker poly with revising the components if needed to be and removing the components if there is a chronic infection.  He understands all this and does wish to proceed.  DESCRIPTION OF PROCEDURE:  After informed consent was obtained, appropriate left knee was marked.  Femoral nerve block sustained by Anesthesia.  He was then brought to the operating room, placed supine on the operative table.  General anesthesia was then obtained and a nonsterile tourniquet was placed around his upper left thigh and a Foley catheter was placed as well.  Of note, he has had recurrent urinary tract infections.  A time-out was called to identify correct patient, correct left leg.  We then wrapped out the leg with an Esmarch and the tourniquet was inflated to 300 mm of pressure.  I first aspirated fluid from the knee.  So, I could obtain a cell count, and only got about 10 mL of clear fluid from the knee, which was encouraging.  We then made a midline incision over his previous incision and carried this down the knee joint and carried out a medial parapatellar arthrotomy, and again, did not find any angry synovium, and no evidence of  gross infection.  We did send off a stat Gram stain and culture.  Otherwise, the tissue really looked good.  Of note, I did feel like before opening the knee, he did still have slightly positive drawer sign, and more play with varus and valgus stressing.  We then easily removed the old polyethylene insert, which was not so anywhere.  I really tugged and pulled on the components on the tibial tray and the femoral components and saw no motion of these at all.  We then irrigated 3 L of normal saline solution through the knee itself and removed just a minimal synovium.  We were then able to trial a size 13 insert and this gave him the stability that I was pleased with.  We then placed the real size 13 polyethylene insert and again, I  was pleased with the stability this gave his knee.  We then irrigated the knee with an another L of normal saline solution, and let the tourniquet down.  Hemostasis was obtained with electrocautery.  We closed the arthrotomy with interrupted #1 Vicryl suture followed by 0 Vicryl in the deep tissue, 2-0 Vicryl in subcutaneous tissue, and staples on the skin.  He was then awakened, extubated, and taken to recovery room in stable condition.  All final counts correct.  There were no complications noted.  Postoperatively, we will have him increase his activities as he tolerates with physical therapy and admission to the hospital.  Of note, Richardean CanalGilbert Clark, P.A. presence was definitely required during this case for retracting to get this case completed and closure of the wound.     Vanita Pandahristopher Y. Magnus IvanBlackman, M.D.     CYB/MEDQ  D:  01/05/2014  T:  01/06/2014  Job:  [id number]

## 2014-01-06 NOTE — Progress Notes (Signed)
Subjective: 1 Day Post-Op Procedure(s) (LRB): LEFT TOTAL KNEE POLY EXCHANGE (Left) Patient reports pain as mild.    Objective: Vital signs in last 24 hours: Temp:  [97.5 F (36.4 C)-98.5 F (36.9 C)] 97.7 F (36.5 C) (01/17 0615) Pulse Rate:  [58-99] 85 (01/17 0615) Resp:  [11-20] 20 (01/17 0615) BP: (117-163)/(74-91) 153/75 mmHg (01/17 0615) SpO2:  [94 %-100 %] 95 % (01/17 0615) Weight:  [122.471 kg (270 lb)] 122.471 kg (270 lb) (01/16 1045)  Intake/Output from previous day: 01/16 0701 - 01/17 0700 In: 4890 [P.O.:1780; I.V.:3110] Out: 6495 [EAVWU:9811[Urine:6475; Blood:20] Intake/Output this shift:     Recent Labs  01/03/14 1045 01/06/14 0605  HGB 17.0 14.5    Recent Labs  01/03/14 1045 01/06/14 0605  WBC 11.4* 17.9*  RBC 5.26 4.71  HCT 46.6 42.5  PLT 277 273    Recent Labs  01/03/14 1045 01/06/14 0605  NA 140 138  K 4.5 4.3  CL 100 102  CO2 25 22  BUN 10 12  CREATININE 1.00 0.97  GLUCOSE 101* 183*  CALCIUM 9.5 8.8    Recent Labs  01/03/14 1045  INR 0.98    Intact pulses distally Dorsiflexion/Plantar flexion intact Incision: scant drainage No cellulitis present Compartment soft  Assessment/Plan: 1 Day Post-Op Procedure(s) (LRB): LEFT TOTAL KNEE POLY EXCHANGE (Left) Discharge home with home health  Kathryne HitchBLACKMAN,Lashan Gluth Y 01/06/2014, 7:40 AM

## 2014-01-06 NOTE — Progress Notes (Signed)
   CARE MANAGEMENT NOTE 01/06/2014  Patient:  Benjamin Romero,Benjamin Romero   Account Number:  [id number]  Date Initiated:  01/05/2014  Documentation initiated by:  Colleen CanMANNING,LINDA  Subjective/Objective Assessment:   dx painmful left knee arethroplasty; revision left knee arthroplaty     Action/Plan:   Surgery today. pt/ot pending. CM will follow   Anticipated DC Date:  01/08/2014   Anticipated DC Plan:  HOME W HOME HEALTH SERVICES      DC Planning Services  CM consult      [facility]   Choice offered to / List presented to:  C-1 Patient        HH arranged  HH-2 PT      [facility]   Status of service:  Completed, signed off Medicare Important Message given?   (If response is "NO", the following Medicare IM given date fields will be blank) Date Medicare IM given:   Date Additional Medicare IM given:    Discharge Disposition:  HOME W HOME HEALTH SERVICES  Per UR Regulation:  Reviewed for med. necessity/level of care/duration of stay  If discussed at Long Length of Stay Meetings, dates discussed:    Comments:  01/06/2014 1100 NCM spoke to pt and states he has DME (RW, 3n1) at home. [facility] today. Benjamin DonningAlesia Rune Mendez RN CCM Case Mgmt phone [phone]

## 2014-01-06 NOTE — Progress Notes (Signed)
Pt stable, scripts, d/c instructions given with no questions/concerns voiced by pt.  Pt transported via wheelchair to private vehicle where his daughter waits to drive him home.

## 2014-01-08 LAB — BODY FLUID CULTURE: CULTURE: NO GROWTH

## 2014-01-10 LAB — ANAEROBIC CULTURE

## 2014-04-24 ENCOUNTER — Ambulatory Visit: Payer: Medicare Other | Admitting: Internal Medicine

## 2014-06-04 ENCOUNTER — Encounter: Payer: Self-pay | Admitting: Internal Medicine

## 2014-06-04 DIAGNOSIS — N390 Urinary tract infection, site not specified: Secondary | ICD-10-CM | POA: Insufficient documentation

## 2014-06-04 DIAGNOSIS — N401 Enlarged prostate with lower urinary tract symptoms: Secondary | ICD-10-CM

## 2014-06-04 DIAGNOSIS — Z87891 Personal history of nicotine dependence: Secondary | ICD-10-CM | POA: Insufficient documentation

## 2014-06-07 ENCOUNTER — Encounter: Payer: Self-pay | Admitting: Internal Medicine

## 2014-06-07 ENCOUNTER — Ambulatory Visit (INDEPENDENT_AMBULATORY_CARE_PROVIDER_SITE_OTHER): Payer: Medicare Other | Admitting: Internal Medicine

## 2014-06-07 VITALS — BP 155/97 | HR 73 | Temp 98.3°F | Ht 72.0 in | Wt 270.5 lb

## 2014-06-07 DIAGNOSIS — R350 Frequency of micturition: Secondary | ICD-10-CM | POA: Insufficient documentation

## 2014-06-07 DIAGNOSIS — N401 Enlarged prostate with lower urinary tract symptoms: Secondary | ICD-10-CM | POA: Insufficient documentation

## 2014-06-07 LAB — URINALYSIS, MICROSCOPIC ONLY
CASTS: NONE SEEN
CRYSTALS: NONE SEEN
Squamous Epithelial / LPF: NONE SEEN

## 2014-06-07 LAB — URINALYSIS, ROUTINE W REFLEX MICROSCOPIC
BILIRUBIN URINE: NEGATIVE
Glucose, UA: NEGATIVE mg/dL
KETONES UR: NEGATIVE mg/dL
NITRITE: POSITIVE — AB
PH: 5.5 (ref 5.0–8.0)
Protein, ur: NEGATIVE mg/dL
Specific Gravity, Urine: 1.014 (ref 1.005–1.030)
Urobilinogen, UA: 0.2 mg/dL (ref 0.0–1.0)

## 2014-06-07 NOTE — Progress Notes (Addendum)
Patient ID: Benjamin Romero, male   DOB: Apr 11, 1951, 63 y.o.   MRN: [id number]         [facility]  Reason for Consult: Urinary frequency Referring Physician: Dr. Altamese DillingJames Lehnberg  Patient Active Problem List   Diagnosis Date Noted  . Urinary frequency 06/07/2014    Priority: High  . Urinary tract infection, site not specified   . Benign localized hyperplasia of prostate with urinary obstruction and other lower urinary tract symptoms (LUTS)(600.21)   . Personal history of tobacco use, presenting hazards to health   . Effusion of left knee joint 01/05/2014  . 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  . Arthritis of knee, left 11/03/2012  . Nonspecific (abnormal) findings on radiological and other examination of gastrointestinal tract 12/31/2011  . Unspecified gastritis and gastroduodenitis without mention of hemorrhage 12/31/2011  . Duodenitis 12/31/2011  . Personal history of colonic polyps 11/10/2011  . RESTLESS LEG SYNDROME 03/01/2008  . OBESITY 02/09/2008  . OBSTRUCTIVE SLEEP APNEA 02/09/2008  . Acute pancreatitis 01/30/2008    Patient's Medications  New Prescriptions   No medications on file  Previous Medications   No medications on file  Modified Medications   No medications on file  Discontinued Medications   ASCORBIC ACID (VITAMIN C) 1000 MG TABLET    Take 1,000 mg by mouth daily.   ASPIRIN EC 325 MG TABLET    Take 1 tablet (325 mg total) by mouth 2 (two) times daily.   B COMPLEX-C (B-COMPLEX WITH VITAMIN C) TABLET    Take 1 tablet by mouth as needed.    FISH OIL-OMEGA-3 FATTY ACIDS 1000 MG CAPSULE    Take 1 g by mouth daily.    IBUPROFEN (ADVIL,MOTRIN) 800 MG TABLET    Take 800 mg by mouth every 6 (six) hours.   METHOCARBAMOL (ROBAXIN) 500 MG TABLET    Take 1 tablet (500 mg total) by mouth every 6 (six) hours as needed for muscle spasms.   NITROFURANTOIN, MACROCRYSTAL-MONOHYDRATE, (MACROBID) 100 MG CAPSULE    Take 1 capsule (100  mg total) by mouth 2 (two) times daily.   OXYCODONE-ACETAMINOPHEN (ROXICET) 5-325 MG PER TABLET    Take 1-2 tablets by mouth every 4 (four) hours as needed for severe pain.   Recommendations: 1. Observe off of antibiotics for now 2. UA and urine culture  Assessment: Although he notes some improvement in his urinary frequency while on antibiotics I am not absolutely convinced that he has been having symptomatic urinary tract infections. He is very anxious about having a chronic infection and is worried about the possibility of spreading it to his wife and other people. Based on recent culture results he is developing progressive resistance to oral agents. I told him that I am not confident that another course of oral or IV antibiotics will completely cure his urinary frequency. He is a Naval architecttruck driver and states that IV antibiotics would be very difficult for him to manage. At this time I will observe him off of antibiotics and repeat his urinalysis and urine culture. I will call him next week when I have those results to discuss options with him.   HPI: Benjamin MottWalter A Rybarczyk is a 63 y.o. male with history of microscopic hematuria. He has been evaluated with cystoscopy and CT in the past without any obvious cause found. This has been a problem for at least the past 11 years. He also has a history of urinary frequency that dates back several years.  In the last 6 months he has had multiple urine cultures have grown Escherichia coli. He has received several courses of oral antibiotic therapy since January including ciprofloxacin, Macrodantin, and Augmentin. Although he states that his urinary frequency is completely unchanged over the past year he states that he has noted that his urinary frequency improved when he has been on these courses of antibiotics. He normally passes his urine about every 2 hours but notes that when he is on antibiotics it may improve to every 6 hours. We'll go back to every 2 hours promptly  after stopping antibiotics. He denies having any dysuria. Recent notes from Dr. Gabriel Earingavis Cloward and Dr. Natalia Leatherwoodaniel Woodruff specifically mention that he has no testicular tenderness but he tells me that his testicles have been exquisitely tender for the past 6 months. He is worried about spreading to his infection to his wife.  Review of Systems: Constitutional: negative for anorexia, chills, fatigue, fevers, sweats and weight loss Eyes: negative Ears, nose, mouth, throat, and face: negative Respiratory: negative Cardiovascular: negative Gastrointestinal: negative Genitourinary:positive for frequency, hematuria and nocturia, negative for decreased stream, dysuria, hesitancy and urinary incontinence    Past Medical History  Diagnosis Date  . [facility]/20/13    [facility]  . Colon polyps   . Diverticulosis   . Pancreatitis   . E-coli UTI 11-02-12    1 month ago- due to diet reducing med used-all clear  . Gas   . Abdominal pain   . Sleep apnea     denies-no cpap used,dx. 2009    History  Substance Use Topics  . Smoking status: Current Every Day Smoker -- 0.50 packs/day    Types: Cigarettes  . Smokeless tobacco: Never Used  . Alcohol Use: No     Comment: rare use    Family History  Problem Relation Age of Onset  . Thyroid cancer Mother    No Known Allergies  OBJECTIVE: Blood pressure 155/97, pulse 73, temperature 98.3 F (36.8 C), temperature source Oral, height 6' (1.829 m), weight 270 lb 8 oz (122.698 kg). General: He is in no distress Oral: Some broken and missing teeth. No oropharyngeal lesions Skin: No rash Lungs: Clear Cor: Regular S1 and S2 with no murmurs Abdomen: Obese, soft and nontender. No CVA or suprapubic tenderness GU: Uncircumcised penis with no lesions. Atrophic testicles bilaterally. He winces in pain even when touching his scrotum. I do not note any swelling or other unusual lesions.   Cliffton AstersJohn Campbell, MD [facility] [phone] pager   [phone] cell 06/07/2014, 9:43 AM  Addendum:  His repeat urinalysis was abnormal and urine culture grew Escherichia coli again resistant to all oral antibiotics tested except for cephalexin. I spoke to him by phone today and he is still having urinary frequency but no dysuria or other symptoms indicative of a urinary tract infection. I suspect that this is primarily asymptomatic colonization and recommended continued observation off of antibiotics now. He knows to call me if he develops worsening symptoms.  Cliffton AstersJohn Campbell, MD [facility] [phone] pager   [phone] cell 06/12/2014, 8:57 AM

## 2014-06-09 LAB — URINE CULTURE

## 2014-12-28 IMAGING — CR DG KNEE 1-2V PORT*L*
1 series · 2 of 2 positions shown · non-contrast
Comparison: 11/03/2012

CLINICAL DATA: Postop left knee arthroplasty.

EXAM:
PORTABLE LEFT KNEE - 1-2 VIEW

[Series 1: AP · left · 2 of 2 slices shown]
[im 1/2]
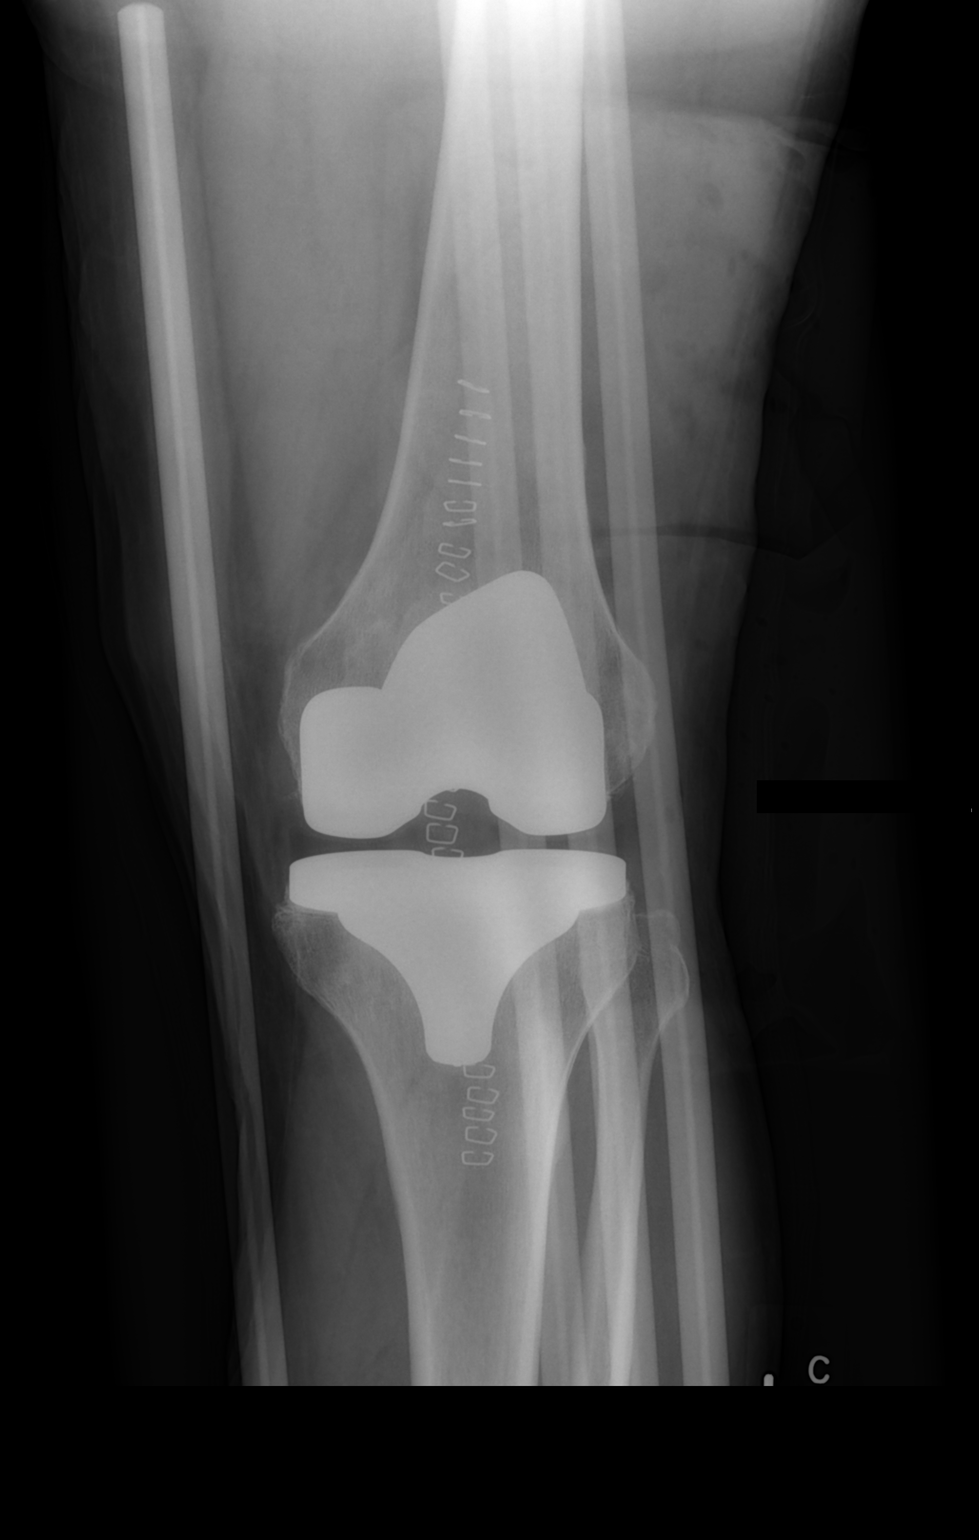
[im 2/2]
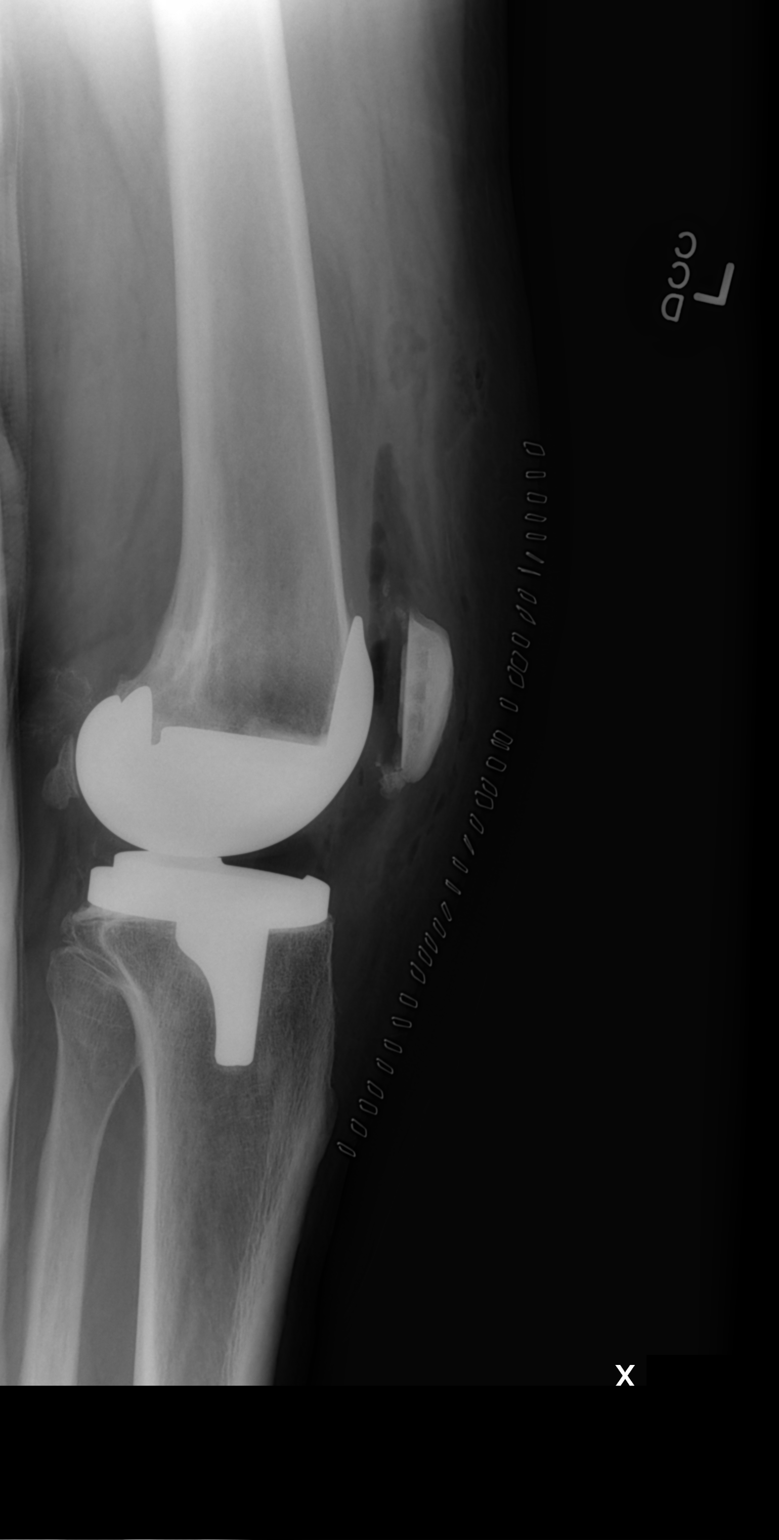

[2 of 2 positions shown; findings below may reference images not displayed]

FINDINGS: Total left knee arthroplasty is appreciated. Hardware is intact.
There is no evidence of loosening or failure. The native osseous
structures are unremarkable. Postsurgical changes appreciated within
the soft tissues of the left knee.
IMPRESSION: Patient is status post total left knee arthroplasty.

## 2015-08-14 ENCOUNTER — Encounter: Payer: Self-pay | Admitting: Gastroenterology

## 2017-02-11 ENCOUNTER — Emergency Department (HOSPITAL_COMMUNITY)
Admission: EM | Admit: 2017-02-11 | Discharge: 2017-02-12 | Disposition: A | Discharge status: Home / Self Care | Payer: Medicare HMO | Attending: Emergency Medicine | Admitting: Emergency Medicine

## 2017-02-11 ENCOUNTER — Encounter (HOSPITAL_COMMUNITY): Payer: Self-pay

## 2017-02-11 DIAGNOSIS — R1084 Generalized abdominal pain: Secondary | ICD-10-CM | POA: Diagnosis present

## 2017-02-11 DIAGNOSIS — R109 Unspecified abdominal pain: Secondary | ICD-10-CM

## 2017-02-11 DIAGNOSIS — Z79899 Other long term (current) drug therapy: Secondary | ICD-10-CM | POA: Insufficient documentation

## 2017-02-11 DIAGNOSIS — Z96652 Presence of left artificial knee joint: Secondary | ICD-10-CM | POA: Diagnosis not present

## 2017-02-11 DIAGNOSIS — F17210 Nicotine dependence, cigarettes, uncomplicated: Secondary | ICD-10-CM | POA: Diagnosis not present

## 2017-02-11 NOTE — ED Provider Notes (Signed)
[facility]-EMERGENCY DEPT Provider Note   CSN: [id number] Arrival date & time: 02/11/17  2056  By signing my name below, I, Octavia Heirrianna Nassar, attest that this documentation has been prepared under the direction and in the presence of Keshona Kartes, MD.  Electronically Signed: Octavia HeirArianna Nassar, ED Scribe. 02/12/17. 12:20 AM.    History   Chief Complaint Chief Complaint  Patient presents with  . Abdominal Pain   The history is provided by the patient. No language interpreter was used.  Abdominal Pain   This is a chronic problem. The current episode started more than 2 days ago. The problem occurs every several days. The problem has not changed since onset.The pain is associated with an unknown factor. The pain is located in the generalized abdominal region. The pain is moderate. Associated symptoms include melena, nausea and constipation. Pertinent negatives include fever and dysuria. Nothing aggravates the symptoms. Nothing relieves the symptoms. Past workup includes CT scan.   HPI Comments: Benjamin Romero is a 66 y.o. male who presents to the Emergency Department complaining of acute on-chronic generalized abdominal pain that began a few days ago. He reports associated melena, constipation, nausea, and vomiting. He had [facility] CT [facility] and was told he had a lot of stool noted. He was advised by his gastroenterologist to have a liquid diet for the next 3 days and to take Miralax BID, which he states he has been doing. He reports being on [employer] Bismal and states he started seeing dark stool since. He expresses the last time he had similar abdominal pain, it was found he had "bacteria in my stomach". He does not have any other complaints.   Past Medical History:  Diagnosis Date  . Abdominal pain   . Acute pancreatitis 05/20/13   [facility]  . Colon polyps   . Diverticulosis   . E-coli UTI 11-02-12   1 month ago- due to diet reducing med used-all clear  .  Gas   . Pancreatitis   . Sleep apnea    denies-no cpap used,dx. 2009    Patient Active Problem List   Diagnosis Date Noted  . Urinary frequency 06/07/2014  . Urinary tract infection, site not specified   . Benign localized hyperplasia of prostate with urinary obstruction and other lower urinary tract symptoms (LUTS)(600.21)   . Personal history of tobacco use, presenting hazards to health   . Effusion of left knee joint 01/05/2014  . Polyethylene wear of left knee prosthesis (HCC) 01/05/2014  . Arthritis of knee, left 11/03/2012  . Nonspecific (abnormal) findings on radiological and other examination of gastrointestinal tract 12/31/2011  . Unspecified gastritis and gastroduodenitis without mention of hemorrhage 12/31/2011  . Duodenitis 12/31/2011  . Personal history of colonic polyps 11/10/2011  . RESTLESS LEG SYNDROME 03/01/2008  . OBESITY 02/09/2008  . OBSTRUCTIVE SLEEP APNEA 02/09/2008  . Acute pancreatitis 01/30/2008    Past Surgical History:  Procedure Laterality Date  . BACK SURGERY     lumbar  . COLONOSCOPY    . EUS  12/31/2011   Procedure: UPPER ENDOSCOPIC ULTRASOUND (EUS) LINEAR;  Surgeon: Rob Buntinganiel Jacobs, MD;  Location: [facility] ENDOSCOPY;  Service: Endoscopy;  Laterality: N/A;  radial linear  . HAND SURGERY Left    repair traum cut left hand  . KNEE SURGERY  bil  . TOTAL KNEE ARTHROPLASTY  11/03/2012   Procedure: TOTAL KNEE ARTHROPLASTY;  Surgeon: Kathryne Hitchhristopher Y Blackman, MD;  Location: [facility] ORS;  Service: Orthopedics;  Laterality: Left;  Left Total Knee Arthroplasty  . TOTAL KNEE REVISION Left 01/05/2014   Procedure: LEFT TOTAL KNEE POLY EXCHANGE;  Surgeon: Kathryne Hitch, MD;  Location: [facility] ORS;  Service: Orthopedics;  Laterality: Left;       Home Medications    Prior to Admission medications   Medication Sig Start Date End Date Taking? Authorizing Provider  Ascorbic Acid (VITAMIN C PO) Take 1 tablet by mouth daily.   Yes Historical Provider, MD  ibuprofen  (ADVIL,MOTRIN) 200 MG tablet Take 800 mg by mouth every 6 (six) hours as needed.   Yes Historical Provider, MD  naproxen sodium (ANAPROX) 220 MG tablet Take 880 mg by mouth 2 (two) times daily with a meal.   Yes Historical Provider, MD  Thiamine HCl (VITAMIN B-1 PO) Take 1 tablet by mouth daily.   Yes Historical Provider, MD  VITAMIN A PO Take 1 tablet by mouth daily.   Yes Historical Provider, MD    Family History Family History  Problem Relation Age of Onset  . Thyroid cancer Mother     Social History Social History  Substance Use Topics  . Smoking status: Current Every Day Smoker    Packs/day: 0.50    Types: Cigarettes  . Smokeless tobacco: Never Used  . Alcohol use No     Comment: rare use     Allergies   Patient has no known allergies.   Review of Systems Review of Systems  Constitutional: Negative for fever.  Gastrointestinal: Positive for abdominal pain, constipation, melena and nausea.  Genitourinary: Negative for dysuria.  All other systems reviewed and are negative.    Physical Exam Updated Vital Signs BP (!) 172/103 (BP Location: Left Arm)   Pulse 78   Temp 98.3 F (36.8 C) (Oral)   Resp 20   Ht 6' (1.829 m)   Wt 252 lb (114.3 kg)   SpO2 100%   BMI 34.18 kg/m   Physical Exam  Constitutional: He is oriented to person, place, and time. He appears well-developed and well-nourished.  HENT:  Head: Normocephalic and atraumatic.  Mouth/Throat: Oropharynx is clear and moist. No oropharyngeal exudate.  Moist mucous membranes. No exudates.   Eyes: Conjunctivae and EOM are normal. Pupils are equal, round, and reactive to light.  Neck: Normal range of motion. Neck supple. No JVD present. No tracheal deviation present.  No carotid bruits. Trachea midline.   Cardiovascular: Normal rate, regular rhythm, normal heart sounds and intact distal pulses.  Exam reveals no gallop and no friction rub.   No murmur heard. RRR.   Pulmonary/Chest: Effort normal and breath  sounds normal. No stridor. No respiratory distress. He has no wheezes. He has no rales.  Lungs CTA bilaterally.   Abdominal: Soft. He exhibits no distension. There is tenderness. There is no rebound and no guarding.  Hyperactive bowel sounds throughout Diffuse abdominal pain  Musculoskeletal: Normal range of motion.  Lymphadenopathy:    He has no cervical adenopathy.  Neurological: He is alert and oriented to person, place, and time. He has normal reflexes. He displays normal reflexes.  Skin: Skin is warm and dry.  Psychiatric: He has a normal mood and affect.  Nursing note and vitals reviewed.    ED Treatments / Results  DIAGNOSTIC STUDIES: Oxygen Saturation is 100% on RA, normal by my interpretation.  COORDINATION OF CARE:  12:14 AM Discussed treatment plan with pt at bedside and pt agreed to plan.  Labs (all labs ordered are listed, but only abnormal  results are displayed) Labs Reviewed - No data to display  EKG  EKG Interpretation None       Radiology No results found.  Procedures Procedures (including critical care time)  Medications Ordered in ED   Final Clinical Impressions(s) / ED Diagnoses   Final diagnoses:  None   This is a 66 y.o. -year-old male presents with abdominal pain.  Exam was consistent with constipation.  I suspected the patient to be drug seeking as he mentioned medication multiple times.  The patient is nontoxic-appearing on exam and vital signs are normal.  I personally performed the services described in this documentation, which was scribed in my presence. The recorded information has been reviewed and is accurate.    New Prescriptions New Prescriptions   No medications on file     Lynnell Fiumara, MD 02/12/17 [phone]

## 2017-02-11 NOTE — ED Triage Notes (Signed)
States had CT with a lot of stool noted and now having black stools with nausea no fever, no dysuria.

## 2017-02-12 ENCOUNTER — Emergency Department (HOSPITAL_COMMUNITY): Payer: Medicare HMO

## 2017-02-12 NOTE — ED Notes (Signed)
Entered patient room to obtain samples. Patient angry that he has not been given pain medication. Attempted to explain to patient that the physician needs updated information before she is going to give him pain medicine. Patient became argumentative that he "just had this done" and questioning why it was being done again. I explained to patient the blood results can change frequently and that was the reason for a recheck tonight. Patient was advised he can refuse any or all pieces of his evaluation if he doesn't want them done. Patient then became more agitated, stating "I've been here since 8 o clock and noone's done nothing, take this thing (IV) out". Patient IV was removed, per his request. Patient was mumbling about having to continue suffering, patient was advised to seek follow up with his GI specialist. Patient left without completing his workup. Blood samples that were previously obtained were disposed of. Patient ambulatory, unassisted, out of the department, NAD noted.

## 2017-02-12 NOTE — ED Notes (Signed)
ED Provider at bedside. 

## 2017-02-13 DIAGNOSIS — N400 Enlarged prostate without lower urinary tract symptoms: Secondary | ICD-10-CM | POA: Insufficient documentation

## 2019-05-10 ENCOUNTER — Emergency Department (HOSPITAL_BASED_OUTPATIENT_CLINIC_OR_DEPARTMENT_OTHER)
Admission: EM | Admit: 2019-05-10 | Discharge: 2019-05-10 | Disposition: A | Discharge status: Home / Self Care | Payer: Medicare HMO | Attending: Emergency Medicine | Admitting: Emergency Medicine

## 2019-05-10 ENCOUNTER — Encounter (HOSPITAL_BASED_OUTPATIENT_CLINIC_OR_DEPARTMENT_OTHER): Payer: Self-pay | Admitting: *Deleted

## 2019-05-10 ENCOUNTER — Other Ambulatory Visit: Payer: Self-pay

## 2019-05-10 ENCOUNTER — Emergency Department (HOSPITAL_BASED_OUTPATIENT_CLINIC_OR_DEPARTMENT_OTHER): Payer: Medicare HMO

## 2019-05-10 DIAGNOSIS — I700 Atherosclerosis of aorta: Secondary | ICD-10-CM | POA: Insufficient documentation

## 2019-05-10 DIAGNOSIS — Z79899 Other long term (current) drug therapy: Secondary | ICD-10-CM | POA: Insufficient documentation

## 2019-05-10 DIAGNOSIS — Z96652 Presence of left artificial knee joint: Secondary | ICD-10-CM | POA: Diagnosis not present

## 2019-05-10 DIAGNOSIS — E785 Hyperlipidemia, unspecified: Secondary | ICD-10-CM | POA: Insufficient documentation

## 2019-05-10 DIAGNOSIS — N281 Cyst of kidney, acquired: Secondary | ICD-10-CM | POA: Insufficient documentation

## 2019-05-10 DIAGNOSIS — R109 Unspecified abdominal pain: Secondary | ICD-10-CM | POA: Diagnosis present

## 2019-05-10 DIAGNOSIS — F17210 Nicotine dependence, cigarettes, uncomplicated: Secondary | ICD-10-CM | POA: Diagnosis not present

## 2019-05-10 LAB — CBC WITH DIFFERENTIAL/PLATELET
Abs Immature Granulocytes: 0.06 10*3/uL (ref 0.00–0.07)
Basophils Absolute: 0.1 10*3/uL (ref 0.0–0.1)
Basophils Relative: 1 %
Eosinophils Absolute: 0.2 10*3/uL (ref 0.0–0.5)
Eosinophils Relative: 2 %
HCT: 48.3 % (ref 39.0–52.0)
Hemoglobin: 16.1 g/dL (ref 13.0–17.0)
Immature Granulocytes: 1 %
Lymphocytes Relative: 26 %
Lymphs Abs: 3.2 10*3/uL (ref 0.7–4.0)
MCH: 30.8 pg (ref 26.0–34.0)
MCHC: 33.3 g/dL (ref 30.0–36.0)
MCV: 92.4 fL (ref 80.0–100.0)
Monocytes Absolute: 1.1 10*3/uL — ABNORMAL HIGH (ref 0.1–1.0)
Monocytes Relative: 9 %
Neutro Abs: 7.8 10*3/uL — ABNORMAL HIGH (ref 1.7–7.7)
Neutrophils Relative %: 61 %
Platelets: 234 10*3/uL (ref 150–400)
RBC: 5.23 MIL/uL (ref 4.22–5.81)
RDW: 14.3 % (ref 11.5–15.5)
WBC: 12.5 10*3/uL — ABNORMAL HIGH (ref 4.0–10.5)
nRBC: 0 % (ref 0.0–0.2)

## 2019-05-10 LAB — COMPREHENSIVE METABOLIC PANEL
ALT: 15 U/L (ref 0–44)
AST: 17 U/L (ref 15–41)
Albumin: 3.9 g/dL (ref 3.5–5.0)
Alkaline Phosphatase: 68 U/L (ref 38–126)
Anion gap: 7 (ref 5–15)
BUN: 11 mg/dL (ref 8–23)
CO2: 25 mmol/L (ref 22–32)
Calcium: 8.8 mg/dL — ABNORMAL LOW (ref 8.9–10.3)
Chloride: 106 mmol/L (ref 98–111)
Creatinine, Ser: 0.98 mg/dL (ref 0.61–1.24)
GFR calc Af Amer: >60 mL/min (ref >60)
GFR calc non Af Amer: >60 mL/min (ref >60)
Glucose, Bld: 97 mg/dL (ref 70–99)
Potassium: 3.8 mmol/L (ref 3.5–5.1)
Sodium: 138 mmol/L (ref 135–145)
Total Bilirubin: 0.6 mg/dL (ref 0.3–1.2)
Total Protein: 7.4 g/dL (ref 6.5–8.1)

## 2019-05-10 LAB — URINALYSIS, ROUTINE W REFLEX MICROSCOPIC
Bilirubin Urine: NEGATIVE
Glucose, UA: NEGATIVE mg/dL
Ketones, ur: NEGATIVE mg/dL
Leukocytes,Ua: NEGATIVE
Nitrite: NEGATIVE
Protein, ur: NEGATIVE mg/dL
Specific Gravity, Urine: 1.02 (ref 1.005–1.030)
pH: 6 (ref 5.0–8.0)

## 2019-05-10 LAB — URINALYSIS, MICROSCOPIC (REFLEX)
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
## 2019-05-10 NOTE — Discharge Instructions (Signed)
You can use salonpas patches or lidoderm cream, available over the counter according to label instructions.  You may also take tylenol according to label instructions.
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
## 2019-05-11 LAB — URINE CULTURE: Culture: NO GROWTH

## 2019-05-15 ENCOUNTER — Emergency Department (HOSPITAL_BASED_OUTPATIENT_CLINIC_OR_DEPARTMENT_OTHER)
Admission: EM | Admit: 2019-05-15 | Discharge: 2019-05-15 | Disposition: A | Discharge status: Home / Self Care | Payer: Medicare HMO | Attending: Emergency Medicine | Admitting: Emergency Medicine

## 2019-05-15 ENCOUNTER — Encounter (HOSPITAL_BASED_OUTPATIENT_CLINIC_OR_DEPARTMENT_OTHER): Payer: Self-pay | Admitting: *Deleted

## 2019-05-15 ENCOUNTER — Other Ambulatory Visit: Payer: Self-pay

## 2019-05-15 DIAGNOSIS — Y93E8 Activity, other personal hygiene: Secondary | ICD-10-CM | POA: Insufficient documentation

## 2019-05-15 DIAGNOSIS — Y929 Unspecified place or not applicable: Secondary | ICD-10-CM | POA: Diagnosis not present

## 2019-05-15 DIAGNOSIS — T25021A Burn of unspecified degree of right foot, initial encounter: Secondary | ICD-10-CM | POA: Diagnosis present

## 2019-05-15 DIAGNOSIS — F17210 Nicotine dependence, cigarettes, uncomplicated: Secondary | ICD-10-CM | POA: Diagnosis not present

## 2019-05-15 DIAGNOSIS — X118XXA Contact with other hot tap-water, initial encounter: Secondary | ICD-10-CM | POA: Insufficient documentation

## 2019-05-15 DIAGNOSIS — T25129A Burn of first degree of unspecified foot, initial encounter: Secondary | ICD-10-CM | POA: Insufficient documentation

## 2019-05-15 DIAGNOSIS — Y999 Unspecified external cause status: Secondary | ICD-10-CM | POA: Insufficient documentation

## 2019-05-15 DIAGNOSIS — T25121A Burn of first degree of right foot, initial encounter: Secondary | ICD-10-CM | POA: Diagnosis not present

## 2019-05-15 DIAGNOSIS — T25022A Burn of unspecified degree of left foot, initial encounter: Secondary | ICD-10-CM

## 2019-05-15 MED ORDER — SILVER SULFADIAZINE 1 % EX CREA: 1.0000 "application " | TOPICAL_CREAM | Freq: Two times a day (BID) | CUTANEOUS | 0 refills | Status: AC

## 2019-05-15 MED ORDER — SILVER SULFADIAZINE 1 % EX CREA
TOPICAL_CREAM | Freq: Once | CUTANEOUS | Status: AC
  Administered 2019-05-15: 22:00:00 via TOPICAL
  Filled 2019-05-15: qty 85
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
## 2019-05-15 NOTE — ED Notes (Signed)
Pt understood dc material. NAD noted. Script given at dc. All questions answered to satisfaction. Pt escorted to check out counter 

## 2019-05-15 NOTE — ED Notes (Signed)
ED Provider at bedside. 

## 2019-05-16 ENCOUNTER — Encounter: Payer: Self-pay | Admitting: Orthopedic Surgery

## 2019-05-16 ENCOUNTER — Ambulatory Visit (INDEPENDENT_AMBULATORY_CARE_PROVIDER_SITE_OTHER): Payer: Medicare HMO | Admitting: Orthopedic Surgery

## 2019-05-16 VITALS — Ht 72.0 in | Wt 264.6 lb

## 2019-05-16 DIAGNOSIS — T25229A Burn of second degree of unspecified foot, initial encounter: Secondary | ICD-10-CM | POA: Diagnosis not present

## 2019-05-17 ENCOUNTER — Telehealth: Payer: Self-pay

## 2019-05-17 ENCOUNTER — Encounter: Payer: Self-pay | Admitting: Orthopedic Surgery

## 2019-05-17 ENCOUNTER — Other Ambulatory Visit: Payer: Self-pay | Admitting: Orthopedic Surgery

## 2019-05-17 MED ORDER — OXYCODONE-ACETAMINOPHEN 5-325 MG PO TABS: 1.0000 | ORAL_TABLET | ORAL | 0 refills | Status: DC | PRN

## 2019-05-17 NOTE — Telephone Encounter (Signed)
Patient would like some pain medication please advise. Patient states the pain is becoming increasingly painful and needs something to ease his pain.

## 2019-05-17 NOTE — Telephone Encounter (Signed)
rx sent to [facility] on [address]
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
## 2019-05-20 ENCOUNTER — Telehealth: Payer: Self-pay | Admitting: Cardiology

## 2019-05-20 NOTE — Telephone Encounter (Signed)
Spoke with patient regarding his recent office visit on 5/26 and possible exposure to Covid.  Offered testing he will call back [phone] when arranges a ride to set up appt.

## 2019-05-21 NOTE — Telephone Encounter (Signed)
RN spoke with the patient.  He mentioned that his daughter is employed at [employer] and works with Dr. Lajoyce Corners.  Patient is currently declining testing.  States his daughter is monitoring him for symptoms.  Patient given [facility] telephone number [phone], to call if he wants testing later this week.

## 2019-05-22 ENCOUNTER — Ambulatory Visit: Payer: Medicare HMO | Admitting: Orthopedic Surgery

## 2019-05-23 ENCOUNTER — Encounter: Payer: Self-pay | Admitting: Family

## 2019-05-23 ENCOUNTER — Other Ambulatory Visit: Payer: Self-pay

## 2019-05-23 ENCOUNTER — Ambulatory Visit (INDEPENDENT_AMBULATORY_CARE_PROVIDER_SITE_OTHER): Payer: Medicare Other | Admitting: Orthopedic Surgery

## 2019-05-23 VITALS — Ht 72.0 in | Wt 264.6 lb

## 2019-05-23 DIAGNOSIS — T25229A Burn of second degree of unspecified foot, initial encounter: Secondary | ICD-10-CM

## 2019-05-25 ENCOUNTER — Other Ambulatory Visit: Payer: Medicare HMO

## 2019-05-25 ENCOUNTER — Telehealth: Payer: Self-pay | Admitting: *Deleted

## 2019-05-25 DIAGNOSIS — Z20822 Contact with and (suspected) exposure to covid-19: Secondary | ICD-10-CM

## 2019-05-25 NOTE — Telephone Encounter (Signed)
Exposure at [employer]. Scheduled for covid19 testing today at the [facility] site for 1:00p Made aware to wear mask and stay in vehicle.

## 2019-05-27 LAB — NOVEL CORONAVIRUS, NAA: SARS-CoV-2, NAA: NOT DETECTED

## 2019-05-29 ENCOUNTER — Emergency Department (HOSPITAL_BASED_OUTPATIENT_CLINIC_OR_DEPARTMENT_OTHER): Payer: Medicare HMO

## 2019-05-29 ENCOUNTER — Other Ambulatory Visit: Payer: Self-pay

## 2019-05-29 ENCOUNTER — Encounter (HOSPITAL_BASED_OUTPATIENT_CLINIC_OR_DEPARTMENT_OTHER): Payer: Self-pay | Admitting: Emergency Medicine

## 2019-05-29 ENCOUNTER — Emergency Department (HOSPITAL_BASED_OUTPATIENT_CLINIC_OR_DEPARTMENT_OTHER)
Admission: EM | Admit: 2019-05-29 | Discharge: 2019-05-29 | Disposition: A | Discharge status: Home / Self Care | Payer: Medicare HMO | Attending: Emergency Medicine | Admitting: Emergency Medicine

## 2019-05-29 DIAGNOSIS — R109 Unspecified abdominal pain: Secondary | ICD-10-CM | POA: Insufficient documentation

## 2019-05-29 DIAGNOSIS — Z79899 Other long term (current) drug therapy: Secondary | ICD-10-CM | POA: Diagnosis not present

## 2019-05-29 DIAGNOSIS — F17210 Nicotine dependence, cigarettes, uncomplicated: Secondary | ICD-10-CM | POA: Diagnosis not present

## 2019-05-29 LAB — COMPREHENSIVE METABOLIC PANEL
ALT: 15 U/L (ref 0–44)
AST: 15 U/L (ref 15–41)
Albumin: 4.2 g/dL (ref 3.5–5.0)
Alkaline Phosphatase: 70 U/L (ref 38–126)
Anion gap: 9 (ref 5–15)
BUN: 15 mg/dL (ref 8–23)
CO2: 25 mmol/L (ref 22–32)
Calcium: 9.2 mg/dL (ref 8.9–10.3)
Chloride: 105 mmol/L (ref 98–111)
Creatinine, Ser: 0.96 mg/dL (ref 0.61–1.24)
GFR calc Af Amer: >60 mL/min (ref >60)
GFR calc non Af Amer: >60 mL/min (ref >60)
Glucose, Bld: 114 mg/dL — ABNORMAL HIGH (ref 70–99)
Potassium: 4 mmol/L (ref 3.5–5.1)
Sodium: 139 mmol/L (ref 135–145)
Total Bilirubin: 0.8 mg/dL (ref 0.3–1.2)
Total Protein: 7.6 g/dL (ref 6.5–8.1)

## 2019-05-29 LAB — CBC WITH DIFFERENTIAL/PLATELET
Abs Immature Granulocytes: 0.06 10*3/uL (ref 0.00–0.07)
Basophils Absolute: 0.1 10*3/uL (ref 0.0–0.1)
Basophils Relative: 0 %
Eosinophils Absolute: 0.2 10*3/uL (ref 0.0–0.5)
Eosinophils Relative: 2 %
HCT: 49.3 % (ref 39.0–52.0)
Hemoglobin: 16.4 g/dL (ref 13.0–17.0)
Immature Granulocytes: 1 %
Lymphocytes Relative: 21 %
Lymphs Abs: 2.5 10*3/uL (ref 0.7–4.0)
MCH: 30.7 pg (ref 26.0–34.0)
MCHC: 33.3 g/dL (ref 30.0–36.0)
MCV: 92.3 fL (ref 80.0–100.0)
Monocytes Absolute: 1.3 10*3/uL — ABNORMAL HIGH (ref 0.1–1.0)
Monocytes Relative: 10 %
Neutro Abs: 8.2 10*3/uL — ABNORMAL HIGH (ref 1.7–7.7)
Neutrophils Relative %: 66 %
Platelets: 262 10*3/uL (ref 150–400)
RBC: 5.34 MIL/uL (ref 4.22–5.81)
RDW: 13.5 % (ref 11.5–15.5)
WBC: 12.3 10*3/uL — ABNORMAL HIGH (ref 4.0–10.5)
nRBC: 0 % (ref 0.0–0.2)

## 2019-05-29 LAB — URINALYSIS, ROUTINE W REFLEX MICROSCOPIC
Bilirubin Urine: NEGATIVE
Glucose, UA: NEGATIVE mg/dL
Ketones, ur: NEGATIVE mg/dL
Leukocytes,Ua: NEGATIVE
Nitrite: NEGATIVE
Protein, ur: NEGATIVE mg/dL
Specific Gravity, Urine: >1.03 — ABNORMAL HIGH (ref 1.005–1.030)
pH: 5.5 (ref 5.0–8.0)

## 2019-05-29 LAB — URINALYSIS, MICROSCOPIC (REFLEX)

## 2019-05-29 MED ORDER — CYCLOBENZAPRINE HCL 10 MG PO TABS: 10.0000 mg | ORAL_TABLET | Freq: Two times a day (BID) | ORAL | 0 refills | Status: DC | PRN

## 2019-05-29 MED ORDER — LIDOCAINE 5 % EX PTCH: 1.0000 | MEDICATED_PATCH | CUTANEOUS | 0 refills | Status: DC

## 2019-05-29 MED ORDER — IOHEXOL 300 MG/ML  SOLN
100.0000 mL | Freq: Once | INTRAMUSCULAR | Status: AC | PRN
  Administered 2019-05-29: 11:00:00 100 mL via INTRAVENOUS

## 2019-05-29 NOTE — ED Notes (Signed)
Patient attempting to void 

## 2019-05-29 NOTE — ED Notes (Signed)
Patient transported to CT 

## 2019-05-29 NOTE — ED Triage Notes (Signed)
Pt continues to have left flank pain, same as last visit but hasn't gone away.  Pt was concerned about the cyst on his kidney.

## 2019-05-29 NOTE — ED Provider Notes (Signed)
[facility] EMERGENCY DEPARTMENT Provider Note   CSN: [id number] Arrival date & time: 05/29/19  [id number]    History   Chief Complaint Chief Complaint  Patient presents with  . Flank Pain    HPI Benjamin Romero is a 68 y.o. male.     HPI   5/20 presented with left flank pain.  At first was just annoying pain but every single day has been increasing. At first would come and go but now it is constant, steady, severe.  Tried ibuprofen which helped somewhat but still severe. Now is 10/10 pain. Left flank, no radiation.  No pain in middle of back, loss control of bowel or bladder, numbness or weakness.  Right before coming here in May, had some hematuria and UTI but that cleared up. [facility]  No nausea, vomiting, diarrhea, constipation, fevers.     Past Medical History:  Diagnosis Date  . Abdominal pain   . Acute pancreatitis 05/20/13   [facility]  . Colon polyps   . Diverticulosis   . E-coli UTI 11-02-12   1 month ago- due to diet reducing med used-all clear  . Gas   . Pancreatitis   . Sleep apnea    denies-no cpap used,dx. 2009    Patient Active Problem List   Diagnosis Date Noted  . Urinary frequency 06/07/2014  . Urinary tract infection, site not specified   . Benign localized hyperplasia of prostate with urinary obstruction and other lower urinary tract symptoms (LUTS)(600.21)   . Personal history of tobacco use, presenting hazards to health   . Effusion of left knee joint 01/05/2014  . Polyethylene wear of left knee prosthesis (HCC) 01/05/2014  . Arthritis of knee, left 11/03/2012  . Nonspecific (abnormal) findings on radiological and other examination of gastrointestinal tract 12/31/2011  . Unspecified gastritis and gastroduodenitis without mention of hemorrhage 12/31/2011  . Duodenitis 12/31/2011  . Personal history of colonic polyps 11/10/2011  . RESTLESS LEG SYNDROME 03/01/2008  . OBESITY 02/09/2008  . OBSTRUCTIVE  SLEEP APNEA 02/09/2008  . Acute pancreatitis 01/30/2008    Past Surgical History:  Procedure Laterality Date  . BACK SURGERY     lumbar  . COLONOSCOPY    . EUS  12/31/2011   Procedure: UPPER ENDOSCOPIC ULTRASOUND (EUS) LINEAR;  Surgeon: Rob Buntinganiel Jacobs, MD;  Location: [facility] ENDOSCOPY;  Service: Endoscopy;  Laterality: N/A;  radial linear  . HAND SURGERY Left    repair traum cut left hand  . KNEE SURGERY  bil  . TOTAL KNEE ARTHROPLASTY  11/03/2012   Procedure: TOTAL KNEE ARTHROPLASTY;  Surgeon: Kathryne Hitchhristopher Y Blackman, MD;  Location: [facility]S;  Service: Orthopedics;  Laterality: Left;  Left Total Knee Arthroplasty  . TOTAL KNEE REVISION Left 01/05/2014   Procedure: LEFT TOTAL KNEE POLY EXCHANGE;  Surgeon: Kathryne Hitchhristopher Y Blackman, MD;  Location: [facility] ORS;  Service: Orthopedics;  Laterality: Left;        Home Medications    Prior to Admission medications   Medication Sig Start Date End Date Taking? Authorizing Provider  Ascorbic Acid (VITAMIN C PO) Take 1 tablet by mouth daily.    [provider]  cyclobenzaprine (FLEXERIL) 10 MG tablet Take 1 tablet (10 mg total) by mouth 2 (two) times daily as needed for muscle spasms. 05/29/19   Alvira MondaySchlossman, Kellianne Ek, MD  ibuprofen (ADVIL,MOTRIN) 200 MG tablet Take 800 mg by mouth every 6 (six) hours as needed.    [provider]  lidocaine (LIDODERM)  5 % Place 1 patch onto the skin daily. Remove & Discard patch within 12 hours or as directed by MD 05/29/19   Alvira MondaySchlossman, Xavior Niazi, MD  naproxen sodium (ANAPROX) 220 MG tablet Take 880 mg by mouth 2 (two) times daily with a meal.    [provider]  oxyCODONE-acetaminophen (PERCOCET/ROXICET) 5-325 MG tablet Take 1 tablet by mouth every 4 (four) hours as needed for severe pain. 05/17/19   Nadara Mustarduda, Marcus V, MD  Thiamine HCl (VITAMIN B-1 PO) Take 1 tablet by mouth daily.    [provider]  VITAMIN A PO Take 1 tablet by mouth daily.    [provider]    Family History Family History   Problem Relation Age of Onset  . Thyroid cancer Mother     Social History Social History   Tobacco Use  . Smoking status: Current Every Day Smoker    Packs/day: 2.00    Types: Cigarettes  . Smokeless tobacco: Never Used  Substance Use Topics  . Alcohol use: Yes    Comment: pt states he drank a half pint for pain  . Drug use: No     Allergies   Patient has no known allergies.   Review of Systems Review of Systems  Constitutional: Negative for fever.  HENT: Negative for sore throat.   Eyes: Negative for visual disturbance.  Respiratory: Negative for shortness of breath.   Cardiovascular: Negative for chest pain.  Gastrointestinal: Negative for abdominal pain, diarrhea, nausea and vomiting.  Genitourinary: Positive for flank pain. Negative for difficulty urinating, dysuria and hematuria.  Musculoskeletal: Negative for back pain and neck stiffness.  Skin: Negative for rash.  Neurological: Negative for syncope and headaches.     Physical Exam Updated Vital Signs BP (!) 179/104 (BP Location: Right Arm)   Pulse 65   Temp 98 F (36.7 C) (Oral)   Resp 18   SpO2 98%   Physical Exam Vitals signs and nursing note reviewed.  Constitutional:      General: He is not in acute distress.    Appearance: He is well-developed. He is not diaphoretic.  HENT:     Head: Normocephalic and atraumatic.  Eyes:     Conjunctiva/sclera: Conjunctivae normal.  Neck:     Musculoskeletal: Normal range of motion.  Cardiovascular:     Rate and Rhythm: Normal rate and regular rhythm.     Heart sounds: Normal heart sounds. No murmur. No friction rub. No gallop.   Pulmonary:     Effort: Pulmonary effort is normal. No respiratory distress.     Breath sounds: Normal breath sounds. No wheezing or rales.  Abdominal:     General: There is no distension.     Palpations: Abdomen is soft.     Tenderness: There is no abdominal tenderness. There is no guarding.  Skin:    General: Skin is warm and  dry.  Neurological:     Mental Status: He is alert and oriented to person, place, and time.      ED Treatments / Results  Labs (all labs ordered are listed, but only abnormal results are displayed) Labs Reviewed  CBC WITH DIFFERENTIAL/PLATELET - Abnormal; Notable for the following components:      Result Value   WBC 12.3 (*)    Neutro Abs 8.2 (*)    Monocytes Absolute 1.3 (*)    All other components within normal limits  COMPREHENSIVE METABOLIC PANEL - Abnormal; Notable for the following components:   Glucose, Bld 114 (*)  All other components within normal limits  URINALYSIS, ROUTINE W REFLEX MICROSCOPIC - Abnormal; Notable for the following components:   Specific Gravity, Urine >1.030 (*)    Hgb urine dipstick MODERATE (*)    All other components within normal limits  URINALYSIS, MICROSCOPIC (REFLEX) - Abnormal; Notable for the following components:   Bacteria, UA FEW (*)    All other components within normal limits  URINE CULTURE    EKG None  Radiology Ct Abdomen Pelvis W Contrast  Result Date: 05/29/2019 CLINICAL DATA:  Abdominal pain, diverticulitis suspected. EXAM: CT ABDOMEN AND PELVIS WITH CONTRAST TECHNIQUE: Multidetector CT imaging of the abdomen and pelvis was performed using the standard protocol following bolus administration of intravenous contrast. CONTRAST:  189mL OMNIPAQUE IOHEXOL 300 MG/ML  SOLN COMPARISON:  05/10/2019 FINDINGS: Lower chest: Lung bases are clear. Hepatobiliary: No focal hepatic lesion. No biliary duct dilatation. Gallbladder is normal. Common bile duct is normal. Pancreas: Pancreas is normal. No ductal dilatation. No pancreatic inflammation. Spleen: Normal spleen Adrenals/urinary tract: Adrenal glands normal. Simple fluid attenuation cystic lesion in the RIGHT kidney. Ureters and bladder normal. Stomach/Bowel: Small hiatal hernia. Stomach, duodenum small-bowel normal. Appendix normal. Ascending transverse colon normal. The descending colon and  sigmoid colon normal. No obstructing lesion. No inflammation. Vascular/Lymphatic: Abdominal aorta is normal caliber with atherosclerotic calcification. There is no retroperitoneal or periportal lymphadenopathy. No pelvic lymphadenopathy. Reproductive: Prostate normal. Other: No free fluid. Musculoskeletal: No aggressive osseous lesion. IMPRESSION: 1. No evidence of diverticulitis. 2. Normal appendix. 3. No obstructive uropathy. 4. Normal kidneys.  Benign-appearing cysts in the RIGHT kidney. Electronically Signed   By: Suzy Bouchard M.D.   On: 05/29/2019 11:09    Procedures Procedures (including critical care time)  Medications Ordered in ED Medications  iohexol (OMNIPAQUE) 300 MG/ML solution 100 mL (100 mLs Intravenous Contrast Given 05/29/19 1050)     Initial Impression / Assessment and Plan / ED Course  I have reviewed the triage vital signs and the nursing notes.  Pertinent labs & imaging results that were available during my care of the patient were reviewed by me and considered in my medical decision making (see chart for details).        68 year old male with prior history of pancreatitis, diverticulosis, lumbar spinal surgery, presents with concern for left flank pain.  He had been seen in the emergency department on May 20 for similar symptoms, but reports that his pain is continuing to worsen.  At that time, had CT stone study which showed no acute abnormalities.  Given persistent and worsening pain, will order CT abdomen pelvis with contrast to evaluate for signs of renal infarction or other abnormalities which may be causing his pain. Doubt PE, ACS, given location, no dyspnea, no chest pain, no hypoxia.   UA shows hematuria without signs of UTI. No anemia. Mild leukocytosis. CMP shows no acute abnormalities.  CT abdomen pelvis without acute findings.  Possible pain secondary to spinal process or continued muscle pain. Recommend continued follow up with PCP. Given rx for  flexeril and lidocaine patch. Patient discharged in stable condition with understanding of reasons to return.    Final Clinical Impressions(s) / ED Diagnoses   Final diagnoses:  Left flank pain    ED Discharge Orders         Ordered    cyclobenzaprine (FLEXERIL) 10 MG tablet  2 times daily PRN     05/29/19 1118    lidocaine (LIDODERM) 5 %  Every 24 hours  05/29/19 1118           Alvira MondaySchlossman, Merisa Julio, MD 05/30/19 0600

## 2019-05-30 LAB — URINE CULTURE: Culture: NO GROWTH

## 2019-06-02 ENCOUNTER — Encounter: Payer: Self-pay | Admitting: Orthopedic Surgery
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
## 2019-12-15 ENCOUNTER — Emergency Department (HOSPITAL_BASED_OUTPATIENT_CLINIC_OR_DEPARTMENT_OTHER): Payer: Medicare HMO

## 2019-12-15 ENCOUNTER — Encounter (HOSPITAL_BASED_OUTPATIENT_CLINIC_OR_DEPARTMENT_OTHER): Payer: Self-pay | Admitting: *Deleted

## 2019-12-15 ENCOUNTER — Emergency Department (HOSPITAL_BASED_OUTPATIENT_CLINIC_OR_DEPARTMENT_OTHER)
Admission: EM | Admit: 2019-12-15 | Discharge: 2019-12-15 | Disposition: A | Discharge status: Home / Self Care | Payer: Medicare HMO | Attending: Emergency Medicine | Admitting: Emergency Medicine

## 2019-12-15 ENCOUNTER — Other Ambulatory Visit: Payer: Self-pay

## 2019-12-15 DIAGNOSIS — R0789 Other chest pain: Secondary | ICD-10-CM | POA: Insufficient documentation

## 2019-12-15 DIAGNOSIS — R1013 Epigastric pain: Secondary | ICD-10-CM

## 2019-12-15 DIAGNOSIS — K859 Acute pancreatitis without necrosis or infection, unspecified: Secondary | ICD-10-CM | POA: Diagnosis not present

## 2019-12-15 DIAGNOSIS — R079 Chest pain, unspecified: Secondary | ICD-10-CM

## 2019-12-15 DIAGNOSIS — F17210 Nicotine dependence, cigarettes, uncomplicated: Secondary | ICD-10-CM | POA: Insufficient documentation

## 2019-12-15 DIAGNOSIS — R109 Unspecified abdominal pain: Secondary | ICD-10-CM | POA: Diagnosis present

## 2019-12-15 LAB — COMPREHENSIVE METABOLIC PANEL
ALT: 13 U/L (ref 0–44)
AST: 17 U/L (ref 15–41)
Albumin: 3.8 g/dL (ref 3.5–5.0)
Alkaline Phosphatase: 69 U/L (ref 38–126)
Anion gap: 9 (ref 5–15)
BUN: 13 mg/dL (ref 8–23)
CO2: 27 mmol/L (ref 22–32)
Calcium: 9.4 mg/dL (ref 8.9–10.3)
Chloride: 99 mmol/L (ref 98–111)
Creatinine, Ser: 1.15 mg/dL (ref 0.61–1.24)
GFR calc Af Amer: >60 mL/min (ref >60)
GFR calc non Af Amer: >60 mL/min (ref >60)
Glucose, Bld: 128 mg/dL — ABNORMAL HIGH (ref 70–99)
Potassium: 4.2 mmol/L (ref 3.5–5.1)
Sodium: 135 mmol/L (ref 135–145)
Total Bilirubin: 0.5 mg/dL (ref 0.3–1.2)
Total Protein: 7.5 g/dL (ref 6.5–8.1)

## 2019-12-15 LAB — CBC
HCT: 48 % (ref 39.0–52.0)
Hemoglobin: 16.5 g/dL (ref 13.0–17.0)
MCH: 31.5 pg (ref 26.0–34.0)
MCHC: 34.4 g/dL (ref 30.0–36.0)
MCV: 91.8 fL (ref 80.0–100.0)
Platelets: 252 10*3/uL (ref 150–400)
RBC: 5.23 MIL/uL (ref 4.22–5.81)
RDW: 13.5 % (ref 11.5–15.5)
WBC: 13.2 10*3/uL — ABNORMAL HIGH (ref 4.0–10.5)
nRBC: 0 % (ref 0.0–0.2)

## 2019-12-15 LAB — LIPASE, BLOOD: Lipase: 98 U/L — ABNORMAL HIGH (ref 11–51)

## 2019-12-15 LAB — TROPONIN I (HIGH SENSITIVITY): Troponin I (High Sensitivity): 4 ng/L (ref <18)

## 2019-12-15 MED ORDER — ONDANSETRON HCL 4 MG/2ML IJ SOLN
4.0000 mg | Freq: Once | INTRAMUSCULAR | Status: AC
  Administered 2019-12-15: 4 mg via INTRAVENOUS
  Filled 2019-12-15: qty 2

## 2019-12-15 MED ORDER — MORPHINE SULFATE (PF) 4 MG/ML IV SOLN
4.0000 mg | Freq: Once | INTRAVENOUS | Status: AC
  Administered 2019-12-15: 4 mg via INTRAVENOUS
  Filled 2019-12-15: qty 1

## 2019-12-15 MED ORDER — OXYCODONE-ACETAMINOPHEN 5-325 MG PO TABS
2.0000 | ORAL_TABLET | Freq: Once | ORAL | Status: AC
  Administered 2019-12-15: 2 via ORAL
  Filled 2019-12-15: qty 2

## 2019-12-15 MED ORDER — IOHEXOL 300 MG/ML  SOLN
100.0000 mL | Freq: Once | INTRAMUSCULAR | Status: AC | PRN
  Administered 2019-12-15: 100 mL via INTRAVENOUS

## 2019-12-15 MED ORDER — ONDANSETRON 4 MG PO TBDP: 4.0000 mg | ORAL_TABLET | Freq: Three times a day (TID) | ORAL | 0 refills | Status: DC | PRN

## 2019-12-15 MED ORDER — OXYCODONE-ACETAMINOPHEN 5-325 MG PO TABS: 1.0000 | ORAL_TABLET | Freq: Four times a day (QID) | ORAL | 0 refills | Status: DC | PRN

## 2019-12-15 NOTE — ED Triage Notes (Signed)
Upper epigastric pain x 3 days without N/V/D. Hx of pancreatitis.

## 2019-12-15 NOTE — Discharge Instructions (Signed)
You were seen in the emergency department today with abdominal pain.  We found evidence of pancreatitis in your lab work and on [place]. Return to the ED if your symptoms get worse, you develop fever, worsening pain, confusion you may have to be admitted to the hospital.  Drink primarily fluids and limit your solid food intake for the next 1 to 2 days.  You can slowly introduce more solid foods.  I have provided a prescription for Percocet and Zofran which you can pick up at the pharmacy.

## 2019-12-15 NOTE — ED Provider Notes (Signed)
Emergency Department Provider Note   I have reviewed the triage vital signs and the nursing notes.   HISTORY  Chief Complaint Abdominal Pain   HPI Benjamin Romero is a 68 y.o. male with PMH of pancreatitis presents to the ED with epigastric abdominal pain and nausea. Patient radiates across the top part of the abdomen and into the left chest. He states this feels somewhat unlike prior pancreatitis pain. He has been working as a Naval architecttruck driver and had to pull over and come in when pain became severe. Denies SOB or URI symptoms. No fever. No diarrhea. Patient reports nausea but no vomiting.   Past Medical History:  Diagnosis Date  . Abdominal pain   . Acute pancreatitis 05/20/13   [facility]  . Colon polyps   . Diverticulosis   . E-coli UTI 11-02-12   1 month ago- due to diet reducing med used-all clear  . Gas   . Pancreatitis   . Sleep apnea    denies-no cpap used,dx. 2009    Patient Active Problem List   Diagnosis Date Noted  . Urinary frequency 06/07/2014  . Urinary tract infection, site not specified   . Benign localized hyperplasia of prostate with urinary obstruction and other lower urinary tract symptoms (LUTS)(600.21)   . Personal history of tobacco use, presenting hazards to health   . Effusion of left knee joint 01/05/2014  . Polyethylene wear of left knee prosthesis (HCC) 01/05/2014  . Arthritis of knee, left 11/03/2012  . Nonspecific (abnormal) findings on radiological and other examination of gastrointestinal tract 12/31/2011  . Unspecified gastritis and gastroduodenitis without mention of hemorrhage 12/31/2011  . Duodenitis 12/31/2011  . Personal history of colonic polyps 11/10/2011  . RESTLESS LEG SYNDROME 03/01/2008  . OBESITY 02/09/2008  . OBSTRUCTIVE SLEEP APNEA 02/09/2008  . Acute pancreatitis 01/30/2008    Past Surgical History:  Procedure Laterality Date  . BACK SURGERY     lumbar  . COLONOSCOPY    . EUS  12/31/2011   Procedure: UPPER ENDOSCOPIC ULTRASOUND (EUS) LINEAR;  Surgeon: Rob Buntinganiel Jacobs, MD;  Location: [facility];  Service: Endoscopy;  Laterality: N/A;  radial linear  . HAND SURGERY Left    repair traum cut left hand  . KNEE SURGERY  bil  . TOTAL KNEE ARTHROPLASTY  11/03/2012   Procedure: TOTAL KNEE ARTHROPLASTY;  Surgeon: Kathryne Hitchhristopher Y Blackman, MD;  Location: [facility];  Service: Orthopedics;  Laterality: Left;  Left Total Knee Arthroplasty  . TOTAL KNEE REVISION Left 01/05/2014   Procedure: LEFT TOTAL KNEE POLY EXCHANGE;  Surgeon: Kathryne Hitchhristopher Y Blackman, MD;  Location: [facility];  Service: Orthopedics;  Laterality: Left;    Allergies Patient has no known allergies.  Family History  Problem Relation Age of Onset  . Thyroid cancer Mother     Social History Social History   Tobacco Use  . Smoking status: Current Every Day Smoker    Packs/day: 2.00    Types: Cigarettes  . Smokeless tobacco: Never Used  Substance Use Topics  . Alcohol use: Yes    Comment: pt states he drank a half pint for pain  . Drug use: No    Review of Systems  Constitutional: No fever/chills Eyes: No visual changes. ENT: No sore throat. Cardiovascular: Positive chest pain. Respiratory: Denies shortness of breath. Gastrointestinal: Positive abdominal pain. Positive nausea, no vomiting.  No diarrhea.  No constipation. Genitourinary: Negative for dysuria. Musculoskeletal: Negative for back pain. Skin: Negative for rash. Neurological: Negative for headaches,  focal weakness or numbness.  10-point ROS otherwise negative.  ____________________________________________   PHYSICAL EXAM:  VITAL SIGNS: Vitals:   12/15/19 2130 12/15/19 2200  BP: 140/76 (!) 158/68  Pulse: 61 (!) 59  Resp: 14 14  Temp:    SpO2: 95% 98%   Constitutional: Alert and oriented. Well appearing and in no acute distress. Eyes: Conjunctivae are normal. Head: Atraumatic. Nose: No congestion/rhinnorhea. Mouth/Throat: Mucous membranes  are moist.  Neck: No stridor.   Cardiovascular: Normal rate, regular rhythm. Good peripheral circulation. Grossly normal heart sounds.   Respiratory: Normal respiratory effort.  No retractions. Lungs CTAB. Gastrointestinal: Soft with tenderness in the mid epigastric area and LUQ with voluntary guarding. No rebound. No distention.  Musculoskeletal: No gross deformities of extremities. Neurologic:  Normal speech and language. Skin:  Skin is warm, dry and intact. No rash noted.  ____________________________________________   LABS (all labs ordered are listed, but only abnormal results are displayed)  Labs Reviewed  LIPASE, BLOOD - Abnormal; Notable for the following components:      Result Value   Lipase 98 (*)    All other components within normal limits  COMPREHENSIVE METABOLIC PANEL - Abnormal; Notable for the following components:   Glucose, Bld 128 (*)    All other components within normal limits  CBC - Abnormal; Notable for the following components:   WBC 13.2 (*)    All other components within normal limits  TROPONIN I (HIGH SENSITIVITY)  TROPONIN I (HIGH SENSITIVITY)   ____________________________________________  EKG   EKG Interpretation  Date/Time:  Friday December 15 2019 19:41:17 EST Ventricular Rate:  65 PR Interval:    QRS Duration: 99 QT Interval:  420 QTC Calculation: 437 R Axis:   -25 Text Interpretation: Sinus rhythm. Borderline short PR interval Borderline left axis deviation Similar to 2014 tracing. No STEMI Confirmed by Nanda Quinton [id number]) on 12/16/2019 11:16:02 AM       ____________________________________________  RADIOLOGY  DG Chest 2 View  Result Date: 12/15/2019 CLINICAL DATA:  Chest pain, upper abdominal pain for 3 days with nausea, vomiting and diarrhea, history of pancreatitis EXAM: CHEST - 2 VIEW COMPARISON:  Radiograph May 20, 2013 FINDINGS: No consolidation, features of edema, pneumothorax, or effusion. Pulmonary vascularity is  normally distributed. The cardiomediastinal contours are unremarkable. No acute osseous or soft tissue abnormality. IMPRESSION: No acute cardiopulmonary abnormality. Electronically Signed   By: Lovena Le M.D.   On: 12/15/2019 20:41   CT ABDOMEN PELVIS W CONTRAST  Result Date: 12/15/2019 CLINICAL DATA:  68 year old male with upper abdominal pain. History of pancreatitis. EXAM: CT ABDOMEN AND PELVIS WITH CONTRAST TECHNIQUE: Multidetector CT imaging of the abdomen and pelvis was performed using the standard protocol following bolus administration of intravenous contrast. CONTRAST:  169mL OMNIPAQUE IOHEXOL 300 MG/ML  SOLN COMPARISON:  CT of the abdomen pelvis dated 05/29/2019. FINDINGS: Lower chest: Minimal bibasilar dependent atelectasis. The visualized lung bases are otherwise clear. No intra-abdominal free air or free fluid. Hepatobiliary: No focal liver abnormality is seen. No gallstones, gallbladder wall thickening, or biliary dilatation. Pancreas: There is inflammatory changes of the pancreas consistent with acute pancreatitis. No drainable fluid collection/abscess or pseudocyst. Spleen: Normal in size without focal abnormality. Adrenals/Urinary Tract: The adrenal glands are unremarkable. There is no hydronephrosis on either side. There is symmetric enhancement and excretion of contrast by both kidneys. There is a 1 cm right renal upper pole cyst and a subcentimeter more inferiorly hypodense lesion which is too small to characterize. The visualized ureters and  urinary bladder appear unremarkable. Stomach/Bowel: There are scattered colonic diverticula without active inflammatory changes. There is no bowel obstruction or active inflammation. The appendix is normal. Vascular/Lymphatic: Mild aortoiliac atherosclerotic disease. The IVC is unremarkable. No portal venous gas. There is no adenopathy. Reproductive: The prostate and seminal vesicles are grossly unremarkable. No pelvic mass. Other: None  Musculoskeletal: Degenerative changes of the spine. No acute osseous pathology. IMPRESSION: 1. Acute pancreatitis. No abscess or pseudocyst. 2. Colonic diverticulosis. No bowel obstruction or active inflammation. Normal appendix. 3. Aortic Atherosclerosis (ICD10-I70.0). Electronically Signed   By: Elgie Collard M.D.   On: 12/15/2019 20:49    ____________________________________________   PROCEDURES  Procedure(s) performed:   Procedures  None  ____________________________________________   INITIAL IMPRESSION / ASSESSMENT AND PLAN / ED COURSE  Pertinent labs & imaging results that were available during my care of the patient were reviewed by me and considered in my medical decision making (see chart for details).   Patient presents to the ED with abdominal pain and tenderness on exam. No fever or SIRS criteria on arrival. Well appearing. CT abdomen/pelvis performed with focal tenderness and guarding which shows evidence of uncomplicated pancreatitis. CXR reviewed and unremarkable. Labs with mild lipase elevation. Other labs, EKG, and troponin unremarkable. Left chest pain likely radiation from the pancreatitis. No ACS concern.   Discussed patient symptoms on reassessment and he would prefer to manage pancreatitis at home. No vomiting here. [place] drug database reviewed and pain medication along with Zofran prescribed. Patient to return with any new/worsening symptoms.    ____________________________________________  FINAL CLINICAL IMPRESSION(S) / ED DIAGNOSES  Final diagnoses:  Acute pancreatitis, unspecified complication status, unspecified pancreatitis type  Epigastric pain  Left-sided chest pain     MEDICATIONS GIVEN DURING THIS VISIT:  Medications  morphine 4 MG/ML injection 4 mg (4 mg Intravenous Given 12/15/19 2001)  ondansetron (ZOFRAN) injection 4 mg (4 mg Intravenous Given 12/15/19 1958)  iohexol (OMNIPAQUE) 300 MG/ML solution 100 mL (100 mLs Intravenous Contrast Given  12/15/19 2027)  oxyCODONE-acetaminophen (PERCOCET/ROXICET) 5-325 MG per tablet 2 tablet (2 tablets Oral Given 12/15/19 2319)     NEW OUTPATIENT MEDICATIONS STARTED DURING THIS VISIT:  Discharge Medication List as of 12/15/2019 11:06 PM    START taking these medications   Details  ondansetron (ZOFRAN ODT) 4 MG disintegrating tablet Take 1 tablet (4 mg total) by mouth every 8 (eight) hours as needed for nausea or vomiting., Starting Fri 12/15/2019, Normal        Note:  This document was prepared using Dragon voice recognition software and may include unintentional dictation errors.  Alona Bene, MD, [facility] Emergency Medicine    Antwuan Eckley, Arlyss Repress, MD 12/16/19 [phone]

## 2020-05-14 ENCOUNTER — Ambulatory Visit: Payer: Medicare (Managed Care) | Admitting: Orthopaedic Surgery

## 2020-05-14 ENCOUNTER — Other Ambulatory Visit: Payer: Self-pay

## 2020-05-14 ENCOUNTER — Encounter: Payer: Self-pay | Admitting: Orthopaedic Surgery

## 2020-05-14 ENCOUNTER — Ambulatory Visit: Payer: Self-pay

## 2020-05-14 ENCOUNTER — Ambulatory Visit (INDEPENDENT_AMBULATORY_CARE_PROVIDER_SITE_OTHER): Payer: Medicare (Managed Care)

## 2020-05-14 DIAGNOSIS — G8929 Other chronic pain: Secondary | ICD-10-CM

## 2020-05-14 DIAGNOSIS — Z96652 Presence of left artificial knee joint: Secondary | ICD-10-CM

## 2020-05-14 DIAGNOSIS — M25561 Pain in right knee: Secondary | ICD-10-CM

## 2020-05-14 NOTE — Progress Notes (Signed)
Office Visit Note   Patient: Benjamin Romero           Date of Birth: 03-12-1951           MRN: [id number] Visit Date: 05/14/2020              Requested by: No referring provider defined for this encounter. PCP: System, Provider Not In   Assessment & Plan: Visit Diagnoses:  1. Chronic pain of right knee   2. 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  I also feel that there is some play in his left knee that may need another additional surgery with upsizing of the polyliner.  I would like to send him to physical therapy to work on strengthening just the quad muscles and other muscles around his left knee.  We will see him back in about 6 weeks.  We will discuss further what her treatment options are.  I feel that his right knee definitely needs a knee replacement in the left knee may need further upsizing of his polyliner.  All questions and concerns were answered and addressed.  Follow-Up Instructions: Return in about 6 weeks (around 06/25/2020).   Orders:  Orders Placed This Encounter  Procedures  . XR Knee 1-2 Views Left  . XR Knee 1-2 Views Right   No orders of the defined types were placed in this encounter.     Procedures: No procedures performed   Clinical Data: No additional findings.   Subjective: Chief Complaint  Patient presents with  . Left Knee - Pain  . Right Knee - Pain  The patient is well-known to me.  He has a complicated medical history as it involves both his knees.  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  He had a mechanical fall injuring his right knee while he was in the [employer].  When he got out of the [employer] he had to have surgery on cartilage in his right knee and  he reports this was in the early 1970s.  He had significant arthritis in both knees likely from wear and tear of years of [employer].  He is a thin individual.  In 2013 we replaced his left knee due to severe osteoarthritis.  2 years later we did have to upsize his polydue to recurrent instability issues with that knee and recurrent effusions.  He reports today that he continues to have severe right knee pain from his injury of the right knee when he was in [employer].  Also the left knee still feels unstable to him in spite of a rigorous exercise routine that he performs daily.  He has had no other acute changes in medical status.  He is an active 69 year old gentleman.  HPI  Review of Systems He currently denies any headache, chest pain, shortness of breath, fever, chills, nausea, vomiting  Objective: Vital Signs: There were no vitals taken for this visit.  Physical Exam He is alert and orient x3 and in no acute distress Ortho Exam Examination of his right knee shows a well-healed medial incision.  There is varus malalignment.  There is significant patellofemoral crepitation and severe medial joint line tenderness.  The knee has  slight varus malalignment with good range of motion and is stable ligamentously.  His left operative knee shows no effusion today.  He has some weak quads but he is able to extend his knee there is some evidence of instability on varus and valgus stressing of that knee suggesting continued polywear. Specialty Comments:  No specialty comments available.  Imaging: No results found.   PMFS History: Patient Active Problem List   Diagnosis Date Noted  . Urinary frequency 06/07/2014  . Urinary tract infection, site not specified   . Benign localized hyperplasia of prostate with urinary obstruction and other lower urinary tract symptoms (LUTS)(600.21)   . Personal history of tobacco use, presenting hazards to health   . Effusion of left knee joint  01/05/2014  . Polyethylene wear of left knee prosthesis (HCC) 01/05/2014  . Arthritis of knee, left 11/03/2012  . Nonspecific (abnormal) findings on radiological and other examination of gastrointestinal tract 12/31/2011  . Unspecified gastritis and gastroduodenitis without mention of hemorrhage 12/31/2011  . Duodenitis 12/31/2011  . Personal history of colonic polyps 11/10/2011  . RESTLESS LEG SYNDROME 03/01/2008  . OBESITY 02/09/2008  . OBSTRUCTIVE SLEEP APNEA 02/09/2008  . Acute pancreatitis 01/30/2008   Past Medical History:  Diagnosis Date  . Abdominal pain   . Acute pancreatitis 05/20/13   In [facility]  . Colon polyps   . Diverticulosis   . E-coli UTI 11-02-12   1 month ago- due to diet reducing med used-all clear  . Gas   . Pancreatitis   . Sleep apnea    denies-no cpap used,dx. 2009    Family History  Problem Relation Age of Onset  . Thyroid cancer Mother     Past Surgical History:  Procedure Laterality Date  . BACK SURGERY     lumbar  . COLONOSCOPY    . EUS  12/31/2011   Procedure: UPPER ENDOSCOPIC ULTRASOUND (EUS) LINEAR;  Surgeon: Rob Bunting, MD;  Location: [facility];  Service: Endoscopy;  Laterality: N/A;  radial linear  . HAND SURGERY Left    repair traum cut left hand  . KNEE SURGERY  bil  . TOTAL KNEE ARTHROPLASTY  11/03/2012   Procedure: TOTAL KNEE ARTHROPLASTY;  Surgeon: Kathryne Hitch, MD;  Location: [facility];  Service: Orthopedics;  Laterality: Left;  Left Total Knee Arthroplasty  . TOTAL KNEE REVISION Left 01/05/2014   Procedure: LEFT TOTAL KNEE POLY EXCHANGE;  Surgeon: Kathryne Hitch, MD;  Location: [facility];  Service: Orthopedics;  Laterality: Left;   Social History   Occupational History  . Not on file  Tobacco Use  . Smoking status: Current Every Day Smoker    Packs/day: 2.00    Types: Cigarettes  . Smokeless tobacco: Never Used  Substance and Sexual Activity  . Alcohol use: Yes    Comment: pt states he drank  a half pint for pain  . Drug use: No  . Sexual activity: Yes

## 2020-05-20 IMAGING — CT CT ABDOMEN AND PELVIS WITH CONTRAST
2 of 5 series · 17 of 46 positions shown, 19 images · IV contrast (APPLIED)
Comparison: 05/10/2019

CLINICAL DATA: Abdominal pain, diverticulitis suspected.

EXAM:
CT ABDOMEN AND PELVIS WITH CONTRAST
TECHNIQUE: Multidetector CT imaging of the abdomen and pelvis was performed
using the standard protocol following bolus administration of
intravenous contrast.
CONTRAST:  100mL OMNIPAQUE IOHEXOL 300 MG/ML  SOLN

[Series 2: axial st · axial · 0.93mm/px · z∈[-535,-65]mm · 14 of 106 slices shown, 16 images]
[im 6/106  soft-tissue]
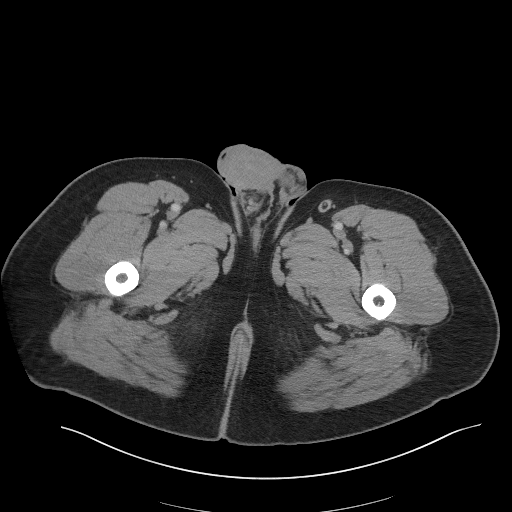
[im 6/106  bone]
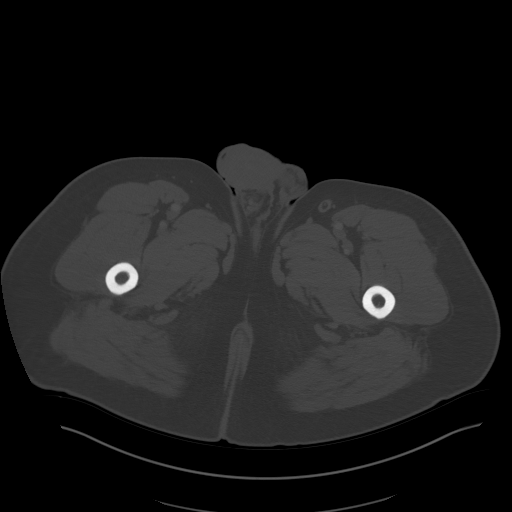
[im 12/106  soft-tissue]
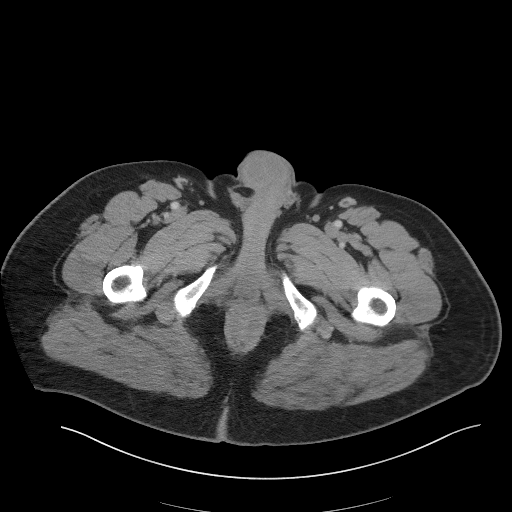
[im 23/106  soft-tissue]
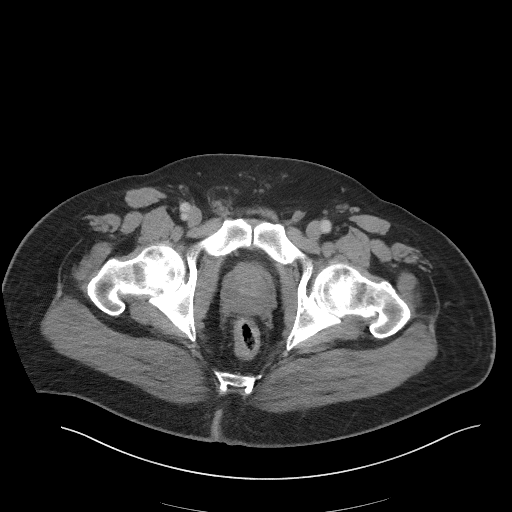
[im 28/106  soft-tissue]
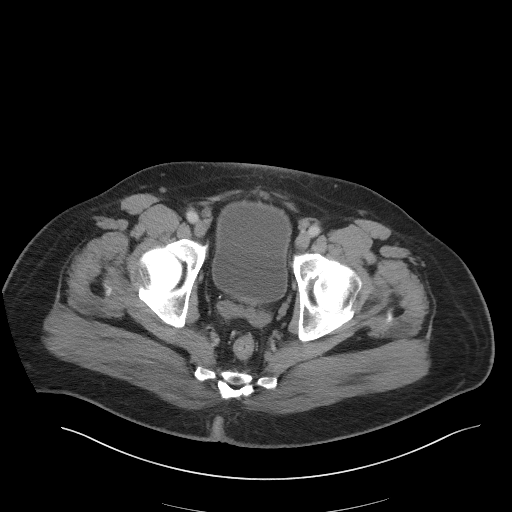
[im 34/106  soft-tissue]
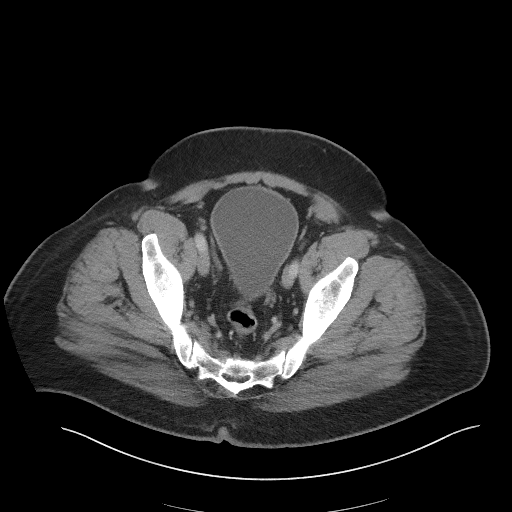
[im 45/106  soft-tissue]
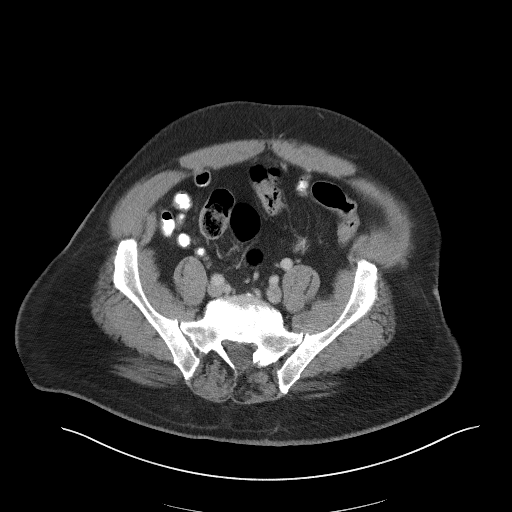
[im 50/106  soft-tissue]
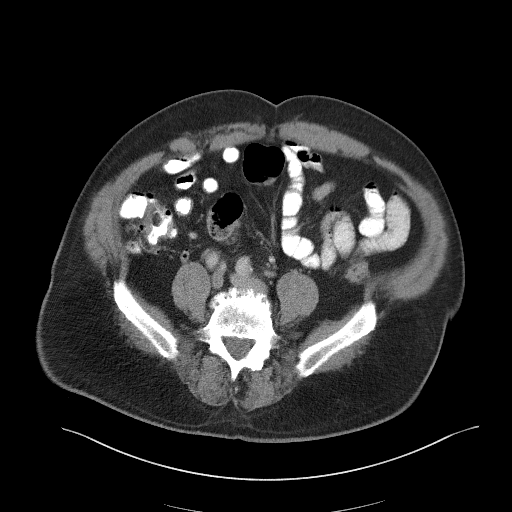
[im 56/106  soft-tissue]
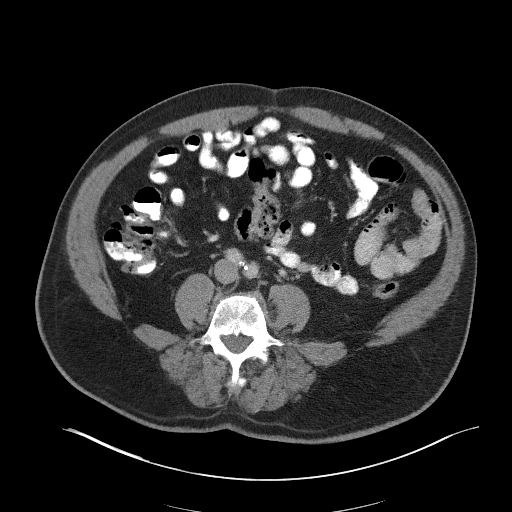
[im 61/106  soft-tissue]
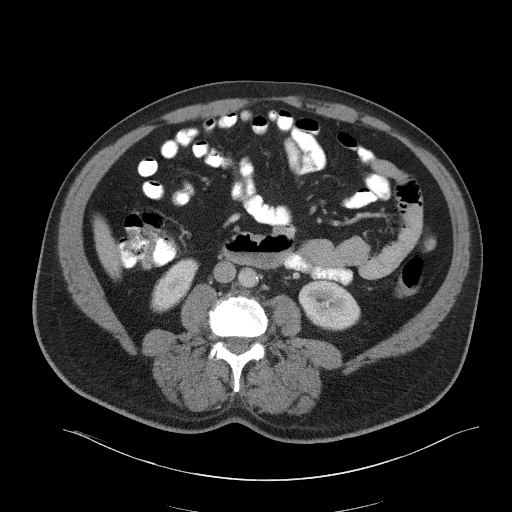
[im 61/106  bone]
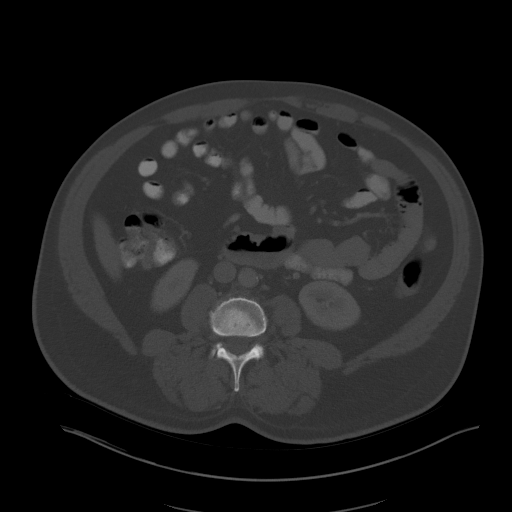
[im 72/106  soft-tissue]
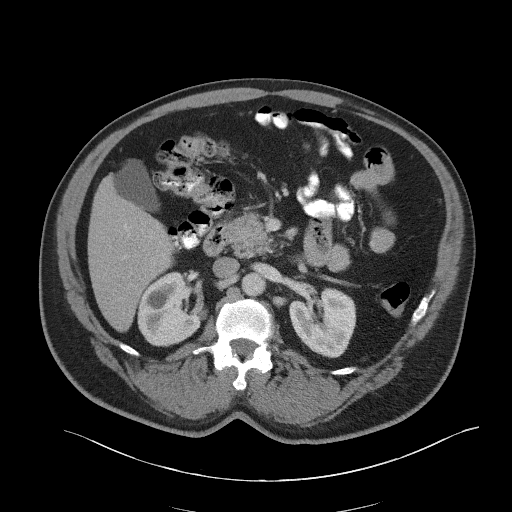
[im 78/106  soft-tissue]
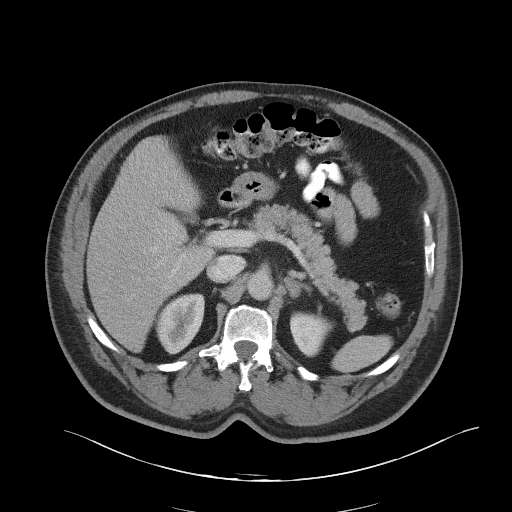
[im 83/106  soft-tissue]
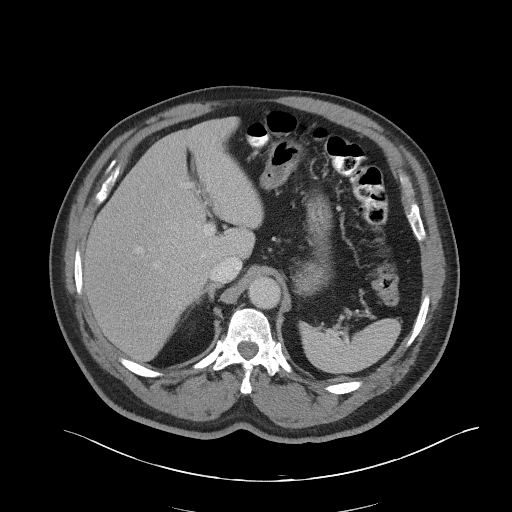
[im 94/106  soft-tissue]
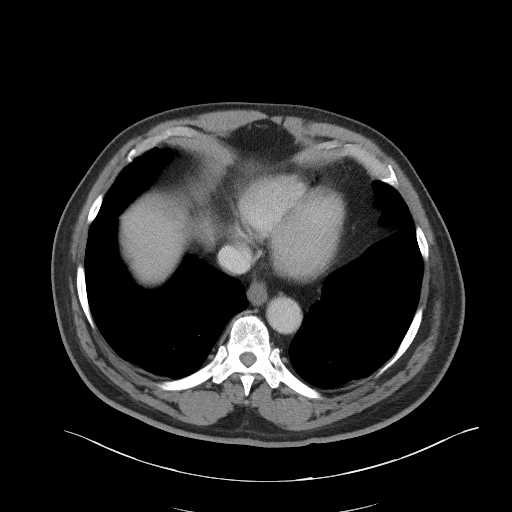
[im 100/106  soft-tissue]
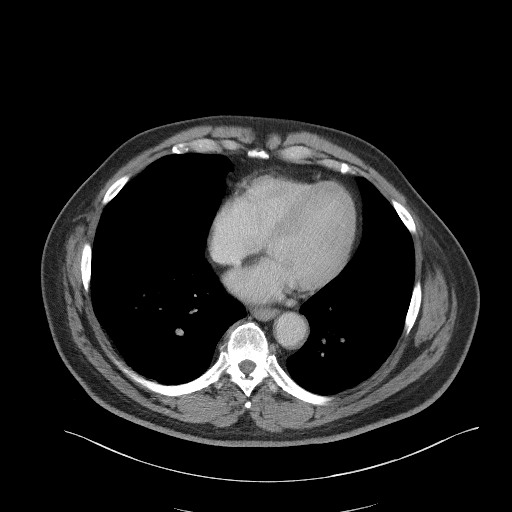

[Series 5: coronal st · coronal · 0.96mm/px · 3 of 116 slices shown]
[im 39/116  soft-tissue]
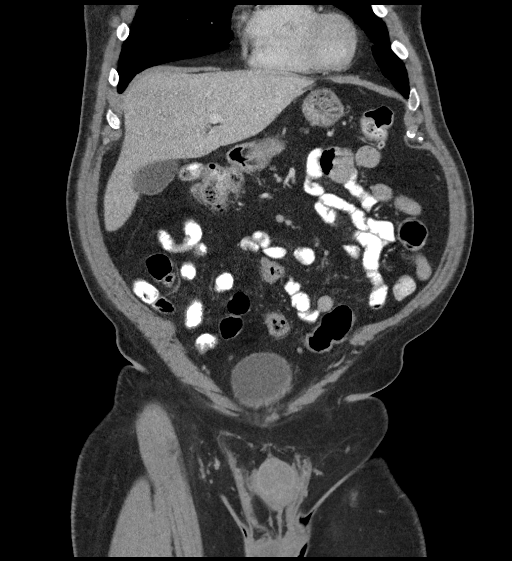
[im 52/116  soft-tissue]
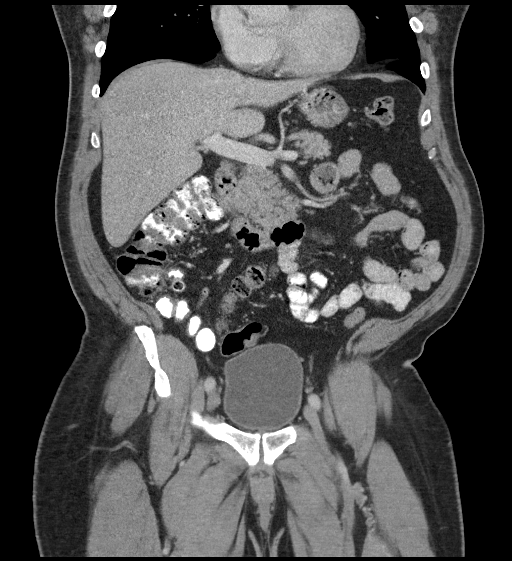
[im 64/116  soft-tissue]
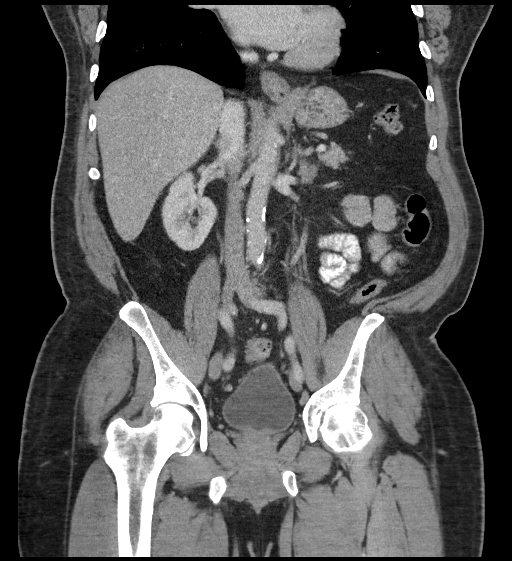

[17 of 46 positions shown; findings below may reference images not displayed]

FINDINGS: Lower chest: Lung bases are clear.

Hepatobiliary: No focal hepatic lesion. No biliary duct dilatation.
Gallbladder is normal. Common bile duct is normal.

Pancreas: Pancreas is normal. No ductal dilatation. No pancreatic
inflammation.

Spleen: Normal spleen

Adrenals/urinary tract: Adrenal glands normal. Simple fluid
attenuation cystic lesion in the RIGHT kidney. Ureters and bladder
normal.

Stomach/Bowel: Small hiatal hernia. Stomach, duodenum small-bowel
normal. Appendix normal. Ascending transverse colon normal. The
descending colon and sigmoid colon normal. No obstructing lesion. No
inflammation.

Vascular/Lymphatic: Abdominal aorta is normal caliber with
atherosclerotic calcification. There is no retroperitoneal or
periportal lymphadenopathy. No pelvic lymphadenopathy.

Reproductive: Prostate normal.

Other: No free fluid.

Musculoskeletal: No aggressive osseous lesion.
IMPRESSION: 1. No evidence of diverticulitis.
2. Normal appendix.
3. No obstructive uropathy.
4. Normal kidneys.  Benign-appearing cysts in the RIGHT kidney.

## 2020-05-27 ENCOUNTER — Ambulatory Visit: Payer: Medicare (Managed Care) | Admitting: Rehabilitative and Restorative Service Providers"

## 2020-05-27 ENCOUNTER — Other Ambulatory Visit: Payer: Self-pay

## 2020-05-27 DIAGNOSIS — M25561 Pain in right knee: Secondary | ICD-10-CM | POA: Insufficient documentation

## 2020-06-11 ENCOUNTER — Other Ambulatory Visit: Payer: Self-pay | Admitting: Family

## 2020-06-18 ENCOUNTER — Ambulatory Visit: Payer: Medicare (Managed Care) | Admitting: Physical Therapy

## 2020-07-01 ENCOUNTER — Ambulatory Visit: Payer: Medicare (Managed Care) | Admitting: Orthopaedic Surgery

## 2020-12-06 IMAGING — CR DG CHEST 2V
2 series · 2 of 2 positions shown · non-contrast
Comparison: Radiograph May 20, 2013

CLINICAL DATA: Chest pain, upper abdominal pain for 3 days with
nausea, vomiting and diarrhea, history of pancreatitis

EXAM:
CHEST - 2 VIEW

[w chest pa]
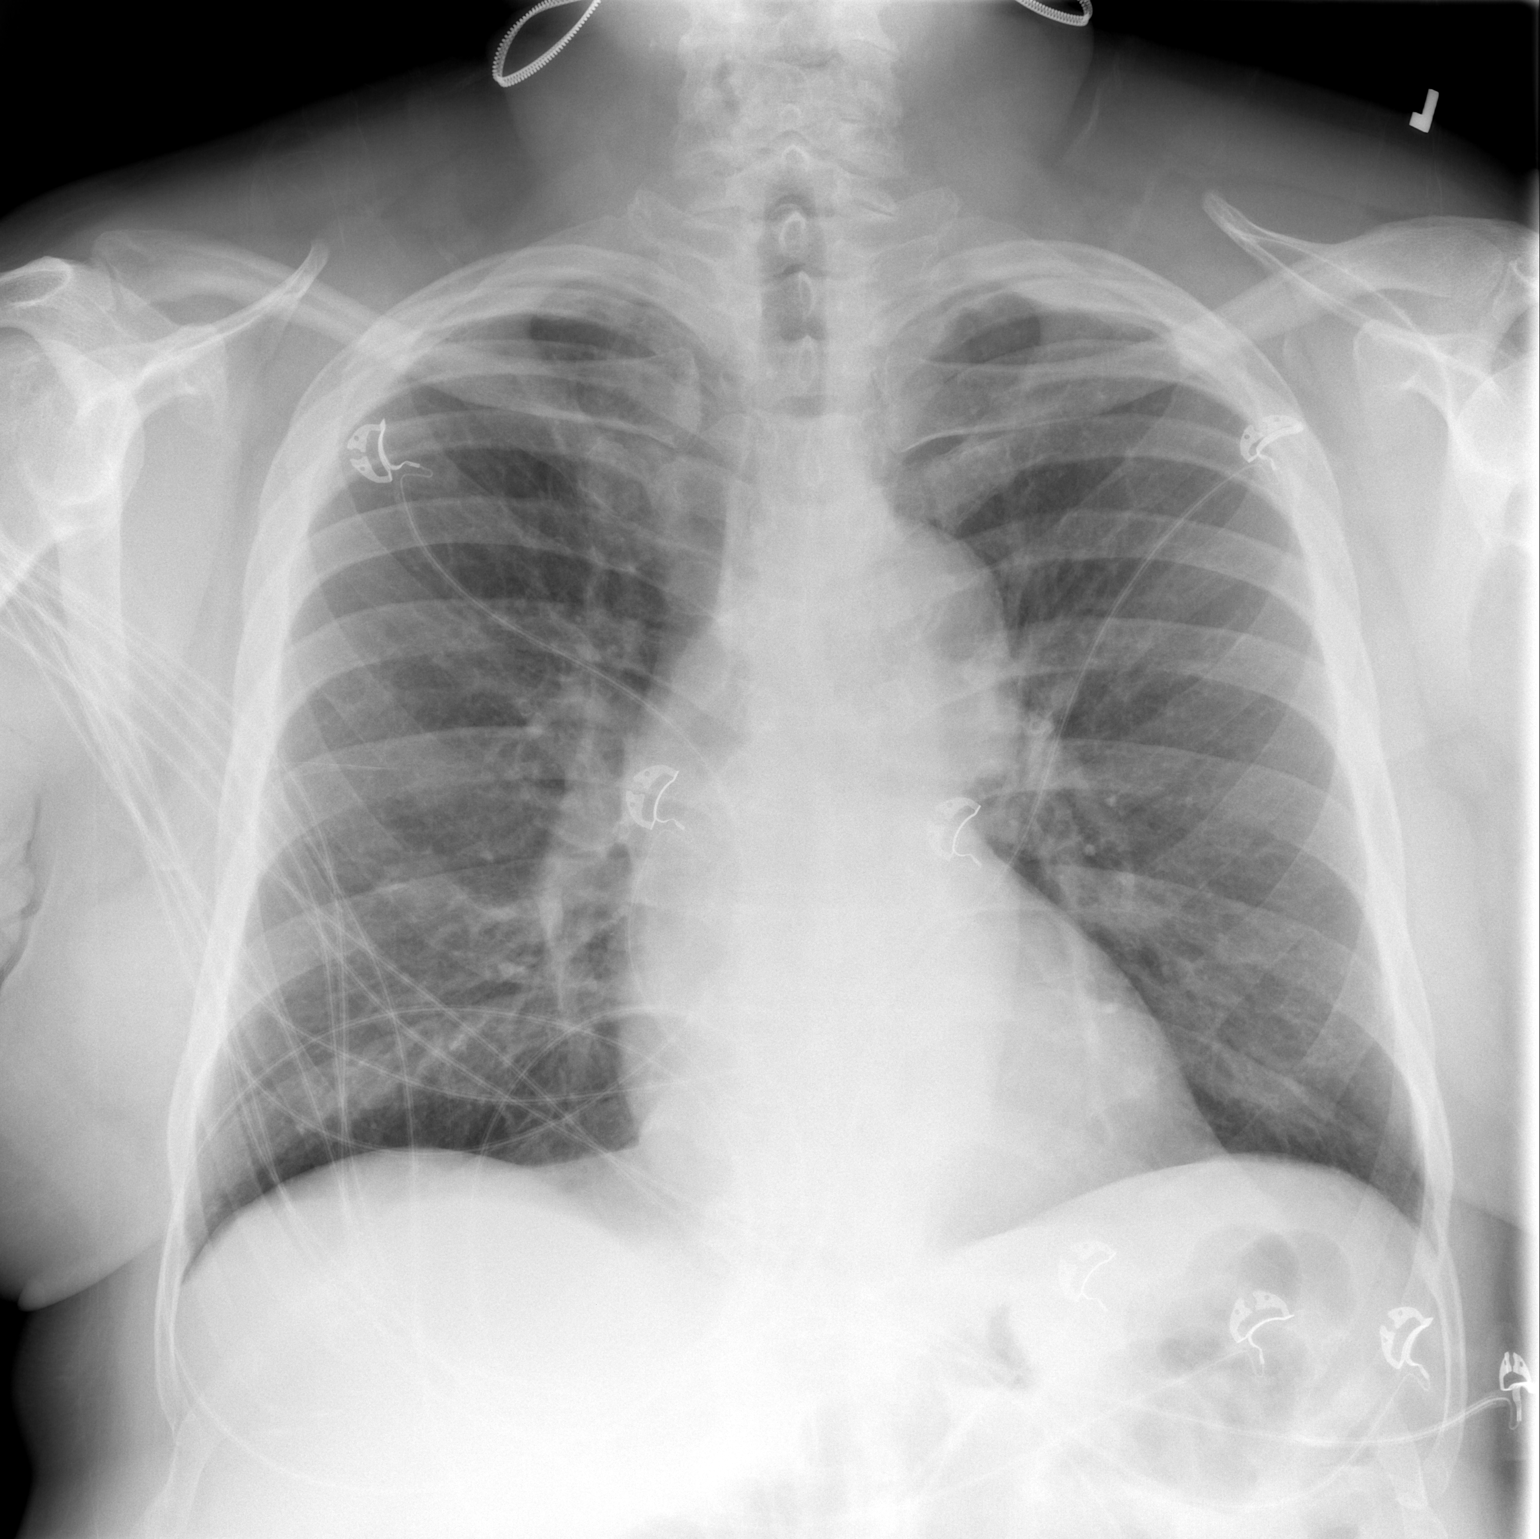

[w chest lat]
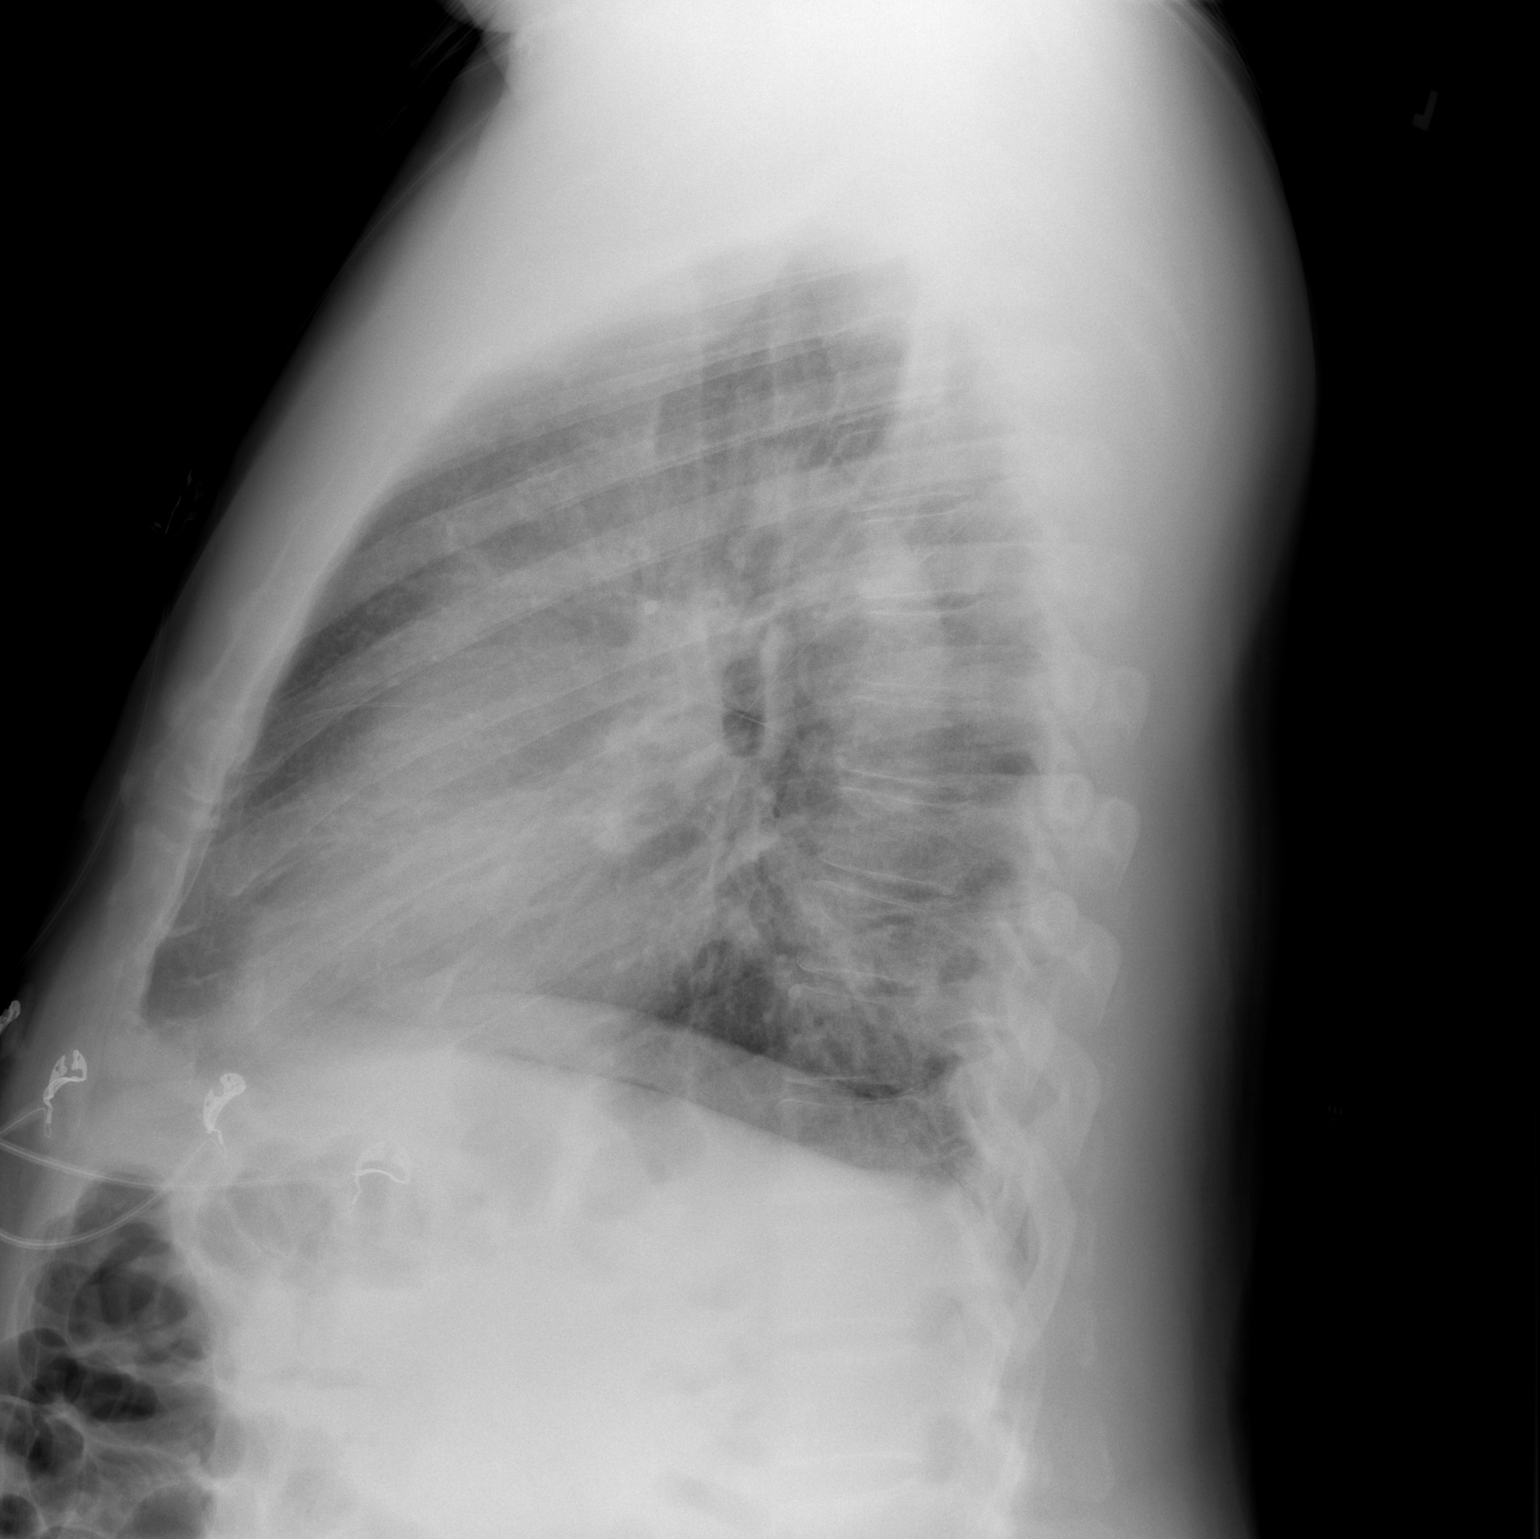

[2 of 2 positions shown; findings below may reference images not displayed]

FINDINGS: No consolidation, features of edema, pneumothorax, or effusion.
Pulmonary vascularity is normally distributed. The cardiomediastinal
contours are unremarkable. No acute osseous or soft tissue
abnormality.
IMPRESSION: No acute cardiopulmonary abnormality.

## 2021-02-23 ENCOUNTER — Emergency Department (HOSPITAL_BASED_OUTPATIENT_CLINIC_OR_DEPARTMENT_OTHER)
Admission: EM | Admit: 2021-02-23 | Discharge: 2021-02-24 | Disposition: A | Discharge status: Home / Self Care | Payer: Medicare HMO | Attending: Emergency Medicine | Admitting: Emergency Medicine

## 2021-02-23 ENCOUNTER — Other Ambulatory Visit: Payer: Self-pay

## 2021-02-23 ENCOUNTER — Encounter (HOSPITAL_BASED_OUTPATIENT_CLINIC_OR_DEPARTMENT_OTHER): Payer: Self-pay | Admitting: Emergency Medicine

## 2021-02-23 ENCOUNTER — Emergency Department (HOSPITAL_BASED_OUTPATIENT_CLINIC_OR_DEPARTMENT_OTHER): Payer: Medicare HMO

## 2021-02-23 DIAGNOSIS — K859 Acute pancreatitis without necrosis or infection, unspecified: Secondary | ICD-10-CM

## 2021-02-23 DIAGNOSIS — F17210 Nicotine dependence, cigarettes, uncomplicated: Secondary | ICD-10-CM | POA: Diagnosis not present

## 2021-02-23 DIAGNOSIS — Z96651 Presence of right artificial knee joint: Secondary | ICD-10-CM | POA: Diagnosis not present

## 2021-02-23 DIAGNOSIS — R109 Unspecified abdominal pain: Secondary | ICD-10-CM | POA: Diagnosis not present

## 2021-02-23 DIAGNOSIS — Z96652 Presence of left artificial knee joint: Secondary | ICD-10-CM | POA: Insufficient documentation

## 2021-02-23 DIAGNOSIS — R1030 Lower abdominal pain, unspecified: Secondary | ICD-10-CM | POA: Diagnosis present

## 2021-02-23 LAB — CBC WITH DIFFERENTIAL/PLATELET
Abs Immature Granulocytes: 0.05 10*3/uL (ref 0.00–0.07)
Basophils Absolute: 0.1 10*3/uL (ref 0.0–0.1)
Basophils Relative: 0 %
Eosinophils Absolute: 0.3 10*3/uL (ref 0.0–0.5)
Eosinophils Relative: 2 %
HCT: 46.8 % (ref 39.0–52.0)
Hemoglobin: 16.2 g/dL (ref 13.0–17.0)
Immature Granulocytes: 0 %
Lymphocytes Relative: 26 %
Lymphs Abs: 3.1 10*3/uL (ref 0.7–4.0)
MCH: 32 pg (ref 26.0–34.0)
MCHC: 34.6 g/dL (ref 30.0–36.0)
MCV: 92.3 fL (ref 80.0–100.0)
Monocytes Absolute: 1.2 10*3/uL — ABNORMAL HIGH (ref 0.1–1.0)
Monocytes Relative: 10 %
Neutro Abs: 7.3 10*3/uL (ref 1.7–7.7)
Neutrophils Relative %: 62 %
Platelets: 277 10*3/uL (ref 150–400)
RBC: 5.07 MIL/uL (ref 4.22–5.81)
RDW: 13.3 % (ref 11.5–15.5)
WBC: 11.9 10*3/uL — ABNORMAL HIGH (ref 4.0–10.5)
nRBC: 0 % (ref 0.0–0.2)

## 2021-02-23 LAB — COMPREHENSIVE METABOLIC PANEL
ALT: 14 U/L (ref 0–44)
AST: 18 U/L (ref 15–41)
Albumin: 3.8 g/dL (ref 3.5–5.0)
Alkaline Phosphatase: 64 U/L (ref 38–126)
Anion gap: 12 (ref 5–15)
BUN: 14 mg/dL (ref 8–23)
CO2: 24 mmol/L (ref 22–32)
Calcium: 9 mg/dL (ref 8.9–10.3)
Chloride: 103 mmol/L (ref 98–111)
Creatinine, Ser: 1.19 mg/dL (ref 0.61–1.24)
GFR, Estimated: >60 mL/min (ref >60)
Glucose, Bld: 122 mg/dL — ABNORMAL HIGH (ref 70–99)
Potassium: 4.1 mmol/L (ref 3.5–5.1)
Sodium: 139 mmol/L (ref 135–145)
Total Bilirubin: 0.5 mg/dL (ref 0.3–1.2)
Total Protein: 7.3 g/dL (ref 6.5–8.1)

## 2021-02-23 LAB — LIPASE, BLOOD: Lipase: 176 U/L — ABNORMAL HIGH (ref 11–51)

## 2021-02-23 MED ORDER — IOHEXOL 300 MG/ML  SOLN
100.0000 mL | Freq: Once | INTRAMUSCULAR | Status: AC | PRN
  Administered 2021-02-23: 100 mL via INTRAVENOUS

## 2021-02-23 MED ORDER — ONDANSETRON HCL 4 MG/2ML IJ SOLN
4.0000 mg | Freq: Once | INTRAMUSCULAR | Status: DC | PRN
  Filled 2021-02-23: qty 2

## 2021-02-23 MED ORDER — FENTANYL CITRATE (PF) 100 MCG/2ML IJ SOLN
100.0000 ug | Freq: Once | INTRAMUSCULAR | Status: AC
  Administered 2021-02-23: 100 ug via INTRAVENOUS
  Filled 2021-02-23: qty 2

## 2021-02-23 NOTE — ED Notes (Signed)
Patient transported to CT 

## 2021-02-23 NOTE — ED Triage Notes (Signed)
Pt states generalized abd pain x 9 days and bloating. Reports 5 days of constipation that has now resolved. Denies n/v/d.

## 2021-02-23 NOTE — ED Provider Notes (Signed)
[facility] Provider Note: Benjamin Dell, MD, FACEP  CSN: [id number] MRN: [id number] ARRIVAL: 02/23/21 at 2243 ROOM: MH05/[facility]05   CHIEF COMPLAINT  Abdominal Pain   HISTORY OF PRESENT ILLNESS  02/23/21 11:02 PM Benjamin Romero is a 70 y.o. male with about 9 days of abdominal pain.  He describes the abdominal pain is in the lower abdomen, crampy in nature and intermittent.  At its worst he rates it as a 10 out of 10.  He was constipated and had no bowel movements for about 5 days.  He took a laxative 2 days ago and has had 4 bowel movement since, 2 loose stools then 2 hard stools.  He denies any associated nausea or vomiting.  The pain is not worse with movement or palpation.    Past Medical History:  Diagnosis Date  . Colon polyps   . Diverticulosis   . E-coli UTI 11-02-12   1 month ago- due to diet reducing med used-all clear  . Gas   . Pancreatitis   . Sleep apnea    denies-no cpap used,dx. 2009    Past Surgical History:  Procedure Laterality Date  . BACK SURGERY     lumbar  . COLONOSCOPY    . EUS  12/31/2011   Procedure: UPPER ENDOSCOPIC ULTRASOUND (EUS) LINEAR;  Surgeon: Rob Bunting, MD;  Location: [facility];  Service: Endoscopy;  Laterality: N/A;  radial linear  . HAND SURGERY Left    repair traum cut left hand  . KNEE SURGERY  bil  . TOTAL KNEE ARTHROPLASTY  11/03/2012   Procedure: TOTAL KNEE ARTHROPLASTY;  Surgeon: Kathryne Hitch, MD;  Location: [facility];  Service: Orthopedics;  Laterality: Left;  Left Total Knee Arthroplasty  . TOTAL KNEE REVISION Left 01/05/2014   Procedure: LEFT TOTAL KNEE POLY EXCHANGE;  Surgeon: Kathryne Hitch, MD;  Location: [facility];  Service: Orthopedics;  Laterality: Left;    Family History  Problem Relation Age of Onset  . Thyroid cancer Mother     Social History   Tobacco Use  . Smoking status: Current Every Day Smoker    Packs/day: 2.00    Types: Cigarettes  . Smokeless tobacco: Never Used   Substance Use Topics  . Alcohol use: Yes    Comment: pt states "1 beer 4 months ago"  . Drug use: No    Prior to Admission medications   Medication Sig Start Date End Date Taking? Authorizing Provider  Ascorbic Acid (VITAMIN C PO) Take 1 tablet by mouth daily.    [provider]  Thiamine HCl (VITAMIN B-1 PO) Take 1 tablet by mouth daily.    [provider]  VITAMIN A PO Take 1 tablet by mouth daily.    [provider]    Allergies Patient has no known allergies.   REVIEW OF SYSTEMS  Negative except as noted here or in the History of Present Illness.   PHYSICAL EXAMINATION  Initial Vital Signs Blood pressure (!) 186/98, pulse 69, temperature 98.9 F (37.2 C), resp. rate 18, height 6' (1.829 m), weight 113.4 kg, SpO2 98 %.  Examination General: Well-developed, well-nourished male in no acute distress; appearance consistent with age of record HENT: normocephalic; atraumatic Eyes: pupils equal, round and reactive to light; extraocular muscles intact; faint arcus senilis bilaterally Neck: supple Heart: regular rate and rhythm Lungs: clear to auscultation bilaterally Abdomen: soft; nondistended; epigastric tenderness; bowel sounds present Extremities: No deformity; full range of motion; pulses normal Neurologic: Awake, alert  and oriented; motor function intact in all extremities and symmetric; no facial droop Skin: Warm and dry Psychiatric: Normal mood and affect   RESULTS  Summary of this visit's results, reviewed and interpreted by myself:   EKG Interpretation  Date/Time:    Ventricular Rate:    PR Interval:    QRS Duration:   QT Interval:    QTC Calculation:   R Axis:     Text Interpretation:        Laboratory Studies: Results for orders placed or performed during the hospital encounter of 02/23/21 (from the past 24 hour(s))  Lipase, blood     Status: Abnormal   Collection Time: 02/23/21 11:00 PM  Result Value Ref Range   Lipase  176 (H) 11 - 51 U/L  Comprehensive metabolic panel     Status: Abnormal   Collection Time: 02/23/21 11:00 PM  Result Value Ref Range   Sodium 139 135 - 145 mmol/L   Potassium 4.1 3.5 - 5.1 mmol/L   Chloride 103 98 - 111 mmol/L   CO2 24 22 - 32 mmol/L   Glucose, Bld 122 (H) 70 - 99 mg/dL   BUN 14 8 - 23 mg/dL   Creatinine, Ser 7.98 0.61 - 1.24 mg/dL   Calcium 9.0 8.9 - 92.1 mg/dL   Total Protein 7.3 6.5 - 8.1 g/dL   Albumin 3.8 3.5 - 5.0 g/dL   AST 18 15 - 41 U/L   ALT 14 0 - 44 U/L   Alkaline Phosphatase 64 38 - 126 U/L   Total Bilirubin 0.5 0.3 - 1.2 mg/dL   GFR, Estimated >19 >41 mL/min   Anion gap 12 5 - 15  CBC with Differential     Status: Abnormal   Collection Time: 02/23/21 11:00 PM  Result Value Ref Range   WBC 11.9 (H) 4.0 - 10.5 K/uL   RBC 5.07 4.22 - 5.81 MIL/uL   Hemoglobin 16.2 13.0 - 17.0 g/dL   HCT 74.0 81.4 - 48.1 %   MCV 92.3 80.0 - 100.0 fL   MCH 32.0 26.0 - 34.0 pg   MCHC 34.6 30.0 - 36.0 g/dL   RDW 85.6 31.4 - 97.0 %   Platelets 277 150 - 400 K/uL   nRBC 0.0 0.0 - 0.2 %   Neutrophils Relative % 62 %   Neutro Abs 7.3 1.7 - 7.7 K/uL   Lymphocytes Relative 26 %   Lymphs Abs 3.1 0.7 - 4.0 K/uL   Monocytes Relative 10 %   Monocytes Absolute 1.2 (H) 0.1 - 1.0 K/uL   Eosinophils Relative 2 %   Eosinophils Absolute 0.3 0.0 - 0.5 K/uL   Basophils Relative 0 %   Basophils Absolute 0.1 0.0 - 0.1 K/uL   Immature Granulocytes 0 %   Abs Immature Granulocytes 0.05 0.00 - 0.07 K/uL   Imaging Studies: CT ABDOMEN PELVIS W CONTRAST  Result Date: 02/24/2021 CLINICAL DATA:  Epigastric abdominal pain EXAM: CT ABDOMEN AND PELVIS WITH CONTRAST TECHNIQUE: Multidetector CT imaging of the abdomen and pelvis was performed using the standard protocol following bolus administration of intravenous contrast. CONTRAST:  OMNIPAQUE IOHEXOL 300 MG/ML  SOLN COMPARISON:  01/14/2019 FINDINGS: Lower chest: Visualized lung bases are clear. The visualized heart and pericardium are  unremarkable. Hepatobiliary: No focal liver abnormality is seen. No gallstones, gallbladder wall thickening, or biliary dilatation. Pancreas: There is mild peripancreatic inflammatory stranding and surrounding the head and uncinate process of the pancreas with heterogeneous enhancement of this region possibly related to  interstitial edema or a patchy parenchymal necrosis, all in keeping with changes of moderate, acute pancreatitis. The pancreatic duct is not dilated. No pancreatic parenchymal calcifications. No peripancreatic fluid collections are identified. Mild peripancreatic shotty adenopathy is likely reactive in nature. Spleen: Normal in size without focal abnormality. Adrenals/Urinary Tract: The adrenal glands are unremarkable. Simple cortical cyst noted within the interpolar region and lower pole of the right kidney. The kidneys are otherwise unremarkable. The bladder is unremarkable. Stomach/Bowel: Moderate stool throughout the colon. The stomach, small bowel, and large bowel are otherwise unremarkable. Appendix normal. No free intraperitoneal gas or fluid. Vascular/Lymphatic: Mild aortoiliac atherosclerotic calcification. No aortic aneurysm. No pathologic adenopathy within the abdomen and pelvis. Reproductive: Prostate is unremarkable. Other: Tiny fat containing umbilical hernia.  Rectum unremarkable. Musculoskeletal: Degenerative changes are seen within the lumbar spine. No acute bone abnormality. No lytic or blastic bone lesion. IMPRESSION: Acute, moderate pancreatitis involving the head and uncinate process of the pancreas with heterogeneous enhancement of this region likely reflecting interstitial edema or patchy parenchymal necrosis. Electronically Signed   By: Helyn Numbers MD   On: 02/24/2021 00:11    ED COURSE and MDM  Nursing notes, initial and subsequent vitals signs, including pulse oximetry, reviewed and interpreted by myself.  Vitals:   02/23/21 2254  BP: (!) 186/98  Pulse: 69   Resp: 18  Temp: 98.9 F (37.2 C)  SpO2: 98%  Weight: 113.4 kg  Height: 6' (1.829 m)   Medications  ondansetron (ZOFRAN) injection 4 mg (has no administration in time range)  fentaNYL (SUBLIMAZE) injection 100 mcg (100 mcg Intravenous Given 02/23/21 2340)  iohexol (OMNIPAQUE) 300 MG/ML solution 100 mL (100 mLs Intravenous Contrast Given 02/23/21 2352)   12:22 AM Patient advised of lab and CT findings consistent with pancreatitis.  He declines admission.  He states his daughter works for [employer] and he will work with her to get into a specialist.  We will also refer to gastroenterology directly.   PROCEDURES  Procedures   ED DIAGNOSES     ICD-10-CM   1. Acute pancreatitis, unspecified complication status, unspecified pancreatitis type  K85.90        Madelena Maturin, Jonny Ruiz, MD 02/24/21 [phone]

## 2021-02-24 DIAGNOSIS — R109 Unspecified abdominal pain: Secondary | ICD-10-CM | POA: Diagnosis not present

## 2021-02-24 MED ORDER — OXYCODONE-ACETAMINOPHEN 10-325 MG PO TABS: 1.0000 | ORAL_TABLET | Freq: Four times a day (QID) | ORAL | 0 refills | Status: DC | PRN

## 2021-02-24 MED ORDER — ONDANSETRON 8 MG PO TBDP: 8.0000 mg | ORAL_TABLET | Freq: Three times a day (TID) | ORAL | 1 refills | Status: DC | PRN

## 2021-02-24 NOTE — ED Notes (Signed)
Per previous RN pt was notified prior to administration of narcotics that he would need a ride home, and assured staff that he could get his wife to come get him. Pt now states he could not get her on the phone. RN offered to call and try to get her on the phone. Pt then stated wife cannot drive at night. CN spoke with pt about not wanting him to drive soon after administration of narcotics for safety reasons. Pt responded "well if I wanted to leave right now, you couldn't hold me here?" CN stated that she would speak with EDP to find out how long he would need to stay to be considered safe to drive. CN spoke with EDP, per EDP if pt was awake and alert and felt he was safe to drive, then pt would be allowed to drive himself home. Pt notified of EDP advice. Pt decided then to drive himself home. Steady gait and fully awake & alert at time of departure.

## 2021-02-24 NOTE — ED Notes (Signed)
D/c paperwork reviewed with pt. Pt states he doesn't want to wake his wife to drive him home and is willing to wait to drive due to receiving pain medication. Pt alert and comfortable at present

## 2021-04-08 ENCOUNTER — Ambulatory Visit: Payer: Medicare HMO | Admitting: Gastroenterology

## 2021-07-01 ENCOUNTER — Encounter (HOSPITAL_BASED_OUTPATIENT_CLINIC_OR_DEPARTMENT_OTHER): Payer: Self-pay | Admitting: *Deleted

## 2021-07-01 ENCOUNTER — Emergency Department (HOSPITAL_BASED_OUTPATIENT_CLINIC_OR_DEPARTMENT_OTHER)
Admission: EM | Admit: 2021-07-01 | Discharge: 2021-07-01 | Disposition: A | Discharge status: Home / Self Care | Payer: Medicare HMO | Attending: Emergency Medicine | Admitting: Emergency Medicine

## 2021-07-01 ENCOUNTER — Other Ambulatory Visit: Payer: Self-pay

## 2021-07-01 DIAGNOSIS — Z79899 Other long term (current) drug therapy: Secondary | ICD-10-CM | POA: Insufficient documentation

## 2021-07-01 DIAGNOSIS — F17210 Nicotine dependence, cigarettes, uncomplicated: Secondary | ICD-10-CM | POA: Insufficient documentation

## 2021-07-01 DIAGNOSIS — Z96652 Presence of left artificial knee joint: Secondary | ICD-10-CM | POA: Insufficient documentation

## 2021-07-01 DIAGNOSIS — H9311 Tinnitus, right ear: Secondary | ICD-10-CM | POA: Diagnosis not present

## 2021-07-01 DIAGNOSIS — I10 Essential (primary) hypertension: Secondary | ICD-10-CM

## 2021-07-01 DIAGNOSIS — D72829 Elevated white blood cell count, unspecified: Secondary | ICD-10-CM | POA: Diagnosis not present

## 2021-07-01 DIAGNOSIS — R030 Elevated blood-pressure reading, without diagnosis of hypertension: Secondary | ICD-10-CM | POA: Diagnosis present

## 2021-07-01 LAB — CBC WITH DIFFERENTIAL/PLATELET
Abs Immature Granulocytes: 0.1 10*3/uL — ABNORMAL HIGH (ref 0.00–0.07)
Basophils Absolute: 0.1 10*3/uL (ref 0.0–0.1)
Basophils Relative: 0 %
Eosinophils Absolute: 0.2 10*3/uL (ref 0.0–0.5)
Eosinophils Relative: 2 %
HCT: 47.3 % (ref 39.0–52.0)
Hemoglobin: 16.6 g/dL (ref 13.0–17.0)
Immature Granulocytes: 1 %
Lymphocytes Relative: 25 %
Lymphs Abs: 2.9 10*3/uL (ref 0.7–4.0)
MCH: 31.6 pg (ref 26.0–34.0)
MCHC: 35.1 g/dL (ref 30.0–36.0)
MCV: 89.9 fL (ref 80.0–100.0)
Monocytes Absolute: 1.1 10*3/uL — ABNORMAL HIGH (ref 0.1–1.0)
Monocytes Relative: 10 %
Neutro Abs: 7.1 10*3/uL (ref 1.7–7.7)
Neutrophils Relative %: 62 %
Platelets: 260 10*3/uL (ref 150–400)
RBC: 5.26 MIL/uL (ref 4.22–5.81)
RDW: 13.9 % (ref 11.5–15.5)
WBC: 11.5 10*3/uL — ABNORMAL HIGH (ref 4.0–10.5)
nRBC: 0 % (ref 0.0–0.2)

## 2021-07-01 LAB — BASIC METABOLIC PANEL
Anion gap: 9 (ref 5–15)
BUN: 13 mg/dL (ref 8–23)
CO2: 24 mmol/L (ref 22–32)
Calcium: 9.2 mg/dL (ref 8.9–10.3)
Chloride: 103 mmol/L (ref 98–111)
Creatinine, Ser: 0.96 mg/dL (ref 0.61–1.24)
GFR, Estimated: >60 mL/min (ref >60)
Glucose, Bld: 102 mg/dL — ABNORMAL HIGH (ref 70–99)
Potassium: 4 mmol/L (ref 3.5–5.1)
Sodium: 136 mmol/L (ref 135–145)

## 2021-07-01 MED ORDER — AMLODIPINE BESYLATE 5 MG PO TABS
5.0000 mg | ORAL_TABLET | Freq: Once | ORAL | Status: AC
  Administered 2021-07-01: 5 mg via ORAL
  Filled 2021-07-01: qty 1

## 2021-07-01 MED ORDER — AMLODIPINE BESYLATE 5 MG PO TABS: ORAL_TABLET | ORAL | 0 refills | Status: DC

## 2021-07-01 NOTE — Discharge Instructions (Addendum)
You were seen in the ER for your fluctuating blood pressure. Your physical exam and blood work today were very reassuring. You have been prescribed a medication called amlodipine to take for elevated blood pressures. Please pay close attention to the administration instructions on the bottle and only take it as directed.   Please call and schedule an appointment with your primary care doctor to discuss this and return to the ER if you develop any chest pain, shortness of breath, palpitations, decrease in the amount urination, blood in urine, or any other new severe symptoms.   Regarding the ringing in your ears, please call the ear nose and throat doctor listed below to schedule follow-up appointment for further evaluation.

## 2021-07-01 NOTE — ED Provider Notes (Signed)
[facility] EMERGENCY DEPARTMENT Provider Note   CSN: [id number] Arrival date & time: 07/01/21  1337     History Chief Complaint  Patient presents with   Tinnitus   Hypertension    Benjamin Romero is a 70 y.o. male who presents with concern for fluctuation with blood pressure at home from the 110s systolic to the 170s systolic depending on the time of day x2 weeks.  States this is never been an issue for him and he is very concerned.  He denies any chest pain, shortness of breath, palpitations, urinary frequency, urgency, hematuria.  Denies any nausea or vomiting, denies any decrease in the amount of urine.  Denies any lower extremity swelling. Also endorses ringing in his right ear x3 weeks. I personally reviewed this patient's medical records.  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  His history of obstructive sleep apnea, colonic polyps.  It is not on any medications every day.  HPI     Past Medical History:  Diagnosis Date   Colon polyps    Diverticulosis    E-coli UTI 11-02-12   1 month ago- due to diet reducing med used-all clear   Gas    Pancreatitis    Sleep apnea    denies-no cpap used,dx. 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   Arthritis of knee, left 11/03/2012   Nonspecific (abnormal) findings on radiological and other examination of gastrointestinal tract 12/31/2011   Unspecified gastritis and gastroduodenitis without mention of hemorrhage 12/31/2011   Duodenitis 12/31/2011   Personal history of colonic polyps 11/10/2011   RESTLESS LEG  SYNDROME 03/01/2008   OBESITY 02/09/2008   OBSTRUCTIVE SLEEP APNEA 02/09/2008   Acute pancreatitis 01/30/2008    Past Surgical History:  Procedure Laterality Date   BACK SURGERY     lumbar   COLONOSCOPY     EUS  12/31/2011   Procedure: UPPER ENDOSCOPIC ULTRASOUND (EUS) LINEAR;  Surgeon: Rob Buntinganiel Jacobs, MD;  Location: [facility];  Service: Endoscopy;  Laterality: N/A;  radial linear   HAND SURGERY Left    repair traum cut left hand   KNEE SURGERY  bil   TOTAL KNEE ARTHROPLASTY  11/03/2012   Procedure: TOTAL KNEE ARTHROPLASTY;  Surgeon: Kathryne Hitchhristopher Y Blackman, MD;  Location: [facility];  Service: Orthopedics;  Laterality: Left;  Left Total Knee Arthroplasty   TOTAL KNEE REVISION Left 01/05/2014   Procedure: LEFT TOTAL KNEE POLY EXCHANGE;  Surgeon: Kathryne Hitchhristopher Y Blackman, MD;  Location: [facility];  Service: Orthopedics;  Laterality: Left;       Family History  Problem Relation Age of Onset   Thyroid cancer Mother     Social History   Tobacco Use   Smoking status: Every Day    Packs/day: 2.00    Pack years: 0.00    Types: Cigarettes   Smokeless tobacco: Never  Substance Use Topics   Alcohol use: Yes    Comment: pt states "1 beer 4 months ago"   Drug use: No    Home Medications Prior to Admission medications   Medication Sig  Start Date End Date Taking? Authorizing Provider  amLODipine (NORVASC) 5 MG tablet Take 5 mg (1 tablet) by mouth if the top number of your blood pressure is >130 at the first blood pressure of the day; take 10 mg (2 tablets) by mouth if the top number of your blood pressure is >160 at the time of your first blood pressure of the day. 07/01/21  Yes Fayrene Towner, Eugene Gavia, PA-C  Ascorbic Acid (VITAMIN C PO) Take 1 tablet by mouth daily.    [provider]  ondansetron (ZOFRAN ODT) 8 MG disintegrating tablet Take 1 tablet (8 mg total) by mouth every 8 (eight) hours as needed for nausea or vomiting. 02/24/21   Molpus, John, MD  oxyCODONE-acetaminophen  (PERCOCET) 10-325 MG tablet Take 1 tablet by mouth every 6 (six) hours as needed for pain. 02/24/21   Molpus, John, MD  Thiamine HCl (VITAMIN B-1 PO) Take 1 tablet by mouth daily.    [provider]  VITAMIN A PO Take 1 tablet by mouth daily.    [provider]    Allergies    Patient has no known allergies.  Review of Systems   Review of Systems  Constitutional: Negative.   HENT:  Positive for tinnitus. Negative for congestion, dental problem, drooling, ear discharge, ear pain, facial swelling, hearing loss, mouth sores, nosebleeds, postnasal drip, rhinorrhea, sinus pain, sore throat and trouble swallowing.   Respiratory: Negative.    Cardiovascular: Negative.        Fluctuation in blood pressure.  Gastrointestinal: Negative.   Musculoskeletal: Negative.   Neurological: Negative.    Physical Exam Updated Vital Signs BP (!) 153/97   Pulse 65   Temp 98.4 F (36.9 C) (Oral)   Resp 19   Ht 6' (1.829 m)   Wt 113.4 kg   SpO2 97%   BMI 33.91 kg/m   Physical Exam Vitals and nursing note reviewed.  Constitutional:      Appearance: He is not ill-appearing or toxic-appearing.  HENT:     Head: Normocephalic and atraumatic.     Right Ear: Tympanic membrane, ear canal and external ear normal.     Left Ear: Tympanic membrane, ear canal and external ear normal.     Nose: Nose normal.     Mouth/Throat:     Mouth: Mucous membranes are moist.     Pharynx: Oropharynx is clear. Uvula midline. No oropharyngeal exudate or posterior oropharyngeal erythema.     Tonsils: No tonsillar exudate.  Eyes:     General: Lids are normal. Vision grossly intact.        Right eye: No discharge.        Left eye: No discharge.     Extraocular Movements: Extraocular movements intact.     Conjunctiva/sclera: Conjunctivae normal.     Pupils: Pupils are equal, round, and reactive to light.  Neck:     Trachea: Trachea and phonation normal.  Cardiovascular:     Rate and Rhythm: Normal rate  and regular rhythm.     Pulses: Normal pulses.     Heart sounds: Normal heart sounds. No murmur heard. Pulmonary:     Effort: Pulmonary effort is normal. No tachypnea, bradypnea, accessory muscle usage, prolonged expiration or respiratory distress.     Breath sounds: Normal breath sounds. No wheezing or rales.  Chest:     Chest wall: No mass, lacerations, deformity, swelling, tenderness, crepitus or edema.  Abdominal:     General: Bowel sounds are normal. There is no  distension.     Palpations: Abdomen is soft.     Tenderness: There is no abdominal tenderness. There is no right CVA tenderness, left CVA tenderness, guarding or rebound.  Musculoskeletal:        General: No deformity.     Cervical back: Normal range of motion and neck supple. No edema, rigidity or crepitus. No pain with movement, spinous process tenderness or muscular tenderness.     Right lower leg: No edema.     Left lower leg: No edema.  Lymphadenopathy:     Cervical: No cervical adenopathy.  Skin:    General: Skin is warm and dry.     Capillary Refill: Capillary refill takes less than 2 seconds.  Neurological:     General: No focal deficit present.     Mental Status: He is alert and oriented to person, place, and time. Mental status is at baseline.     Sensory: Sensation is intact.     Motor: Motor function is intact.     Gait: Gait is intact.  Psychiatric:        Mood and Affect: Mood normal.    ED Results / Procedures / Treatments   Labs (all labs ordered are listed, but only abnormal results are displayed) Labs Reviewed  BASIC METABOLIC PANEL - Abnormal; Notable for the following components:      Result Value   Glucose, Bld 102 (*)    All other components within normal limits  CBC WITH DIFFERENTIAL/PLATELET - Abnormal; Notable for the following components:   WBC 11.5 (*)    Monocytes Absolute 1.1 (*)    Abs Immature Granulocytes 0.10 (*)    All other components within normal limits  URINALYSIS,  ROUTINE W REFLEX MICROSCOPIC    EKG None  Radiology No results found.  Procedures Procedures   Medications Ordered in ED Medications  amLODipine (NORVASC) tablet 5 mg (5 mg Oral Given 07/01/21 1610)    ED Course  I have reviewed the triage vital signs and the nursing notes.  Pertinent labs & imaging results that were available during my care of the patient were reviewed by me and considered in my medical decision making (see chart for details).  Clinical Course as of 07/01/21 1614  Tue Jul 01, 2021  2633 70 year old male present emergency department complaint of fluctuating blood pressures and tinnitus.  He reports that his blood pressure has been labile for the past 2 to 3 weeks.  They have been as high as 170 systolic.  He checks it every day and keeps records of it.  He also reports ringing in his left ear that is persistent, worse with his high blood pressure, but also began about 2 to 3 weeks ago.  He denies headache, numbness or weakness, chest pain or pressure.  He is not on any blood pressure medications or any other chronic medications.  He denies aspirin use or salicylate chronic use.  In the emergency department his vitals are unremarkable.  He is only mildly hypertensive with a systolic pressure ranging between 130 and 150.  His labs are fairly unremarkable.  Kidney function looks okay.  He has no chest pain or pressure to suggest ACS or hypertensive emergency.  He has no neurological deficits to suggest stroke.  His left eardrum appears normal.  His hearing is intact.  I explained the differential for tinnitus which could indeed fluctuate with his blood pressure, but may also be an inner ear condition.  Advised that he follow-up with  ENT.  We will also start him on amlodipine which we can titrate based on his blood pressure at home.  I stressed the importance that he follow-up with a primary care provider for these issues.  He verbalized understanding.  We will discharge him. [MT]     Clinical Course User Index [MT] Terald Sleeper, MD   MDM Rules/Calculators/A&P                         70 year old male who presents with concern for fluctuation of his blood pressure at home as well as ringing in his right ear x3 weeks.  Differential diagnosis includes but is not limited to orthostatic hypotension, hypertension, hypertensive urgency/emergency, anxiety, volume overload.  Very mildly hypertensive on intake to 142/96.  Vital signs otherwise normal.  Cardiopulmonary exam is normal, abdominal exam is benign.  There is no lower extremity edema and there is normal neurovascular status in all 4 extremities.  TMs are normal bilaterally.  Orthostatic vital signs were obtained, decrease in SBP by 18 mmHg from lying to sitting, but SBP increased from sitting to standing.  Heart rate increased by 19 bpm from sitting to standing.  Patient is extremely anxious, continually cycling his blood pressure on the monitor when we are not in the room, and watching it consistently.  CBC with very mild leukocytosis of 11.7, otherwise unremarkable.  BMP unremarkable, normal kidney function.  No further work-up warranted in the this time given reassuring vital signs and laboratory studies as well as physical exam.  Patient evaluated by attending physician at the bedside as well the recommendation for amlodipine as needed for elevated blood pressures first thing in the morning.  This prescription has been sent to his pharmacy.  Regarding his tinnitus, will provide follow-up instructions with [facility] ear nose and throat.  No further work-up warranted in the this time.  Keino voiced understanding with medical evaluation and treatment plan.  Each of his questions was answered to his expressed satisfaction.  Return precautions given.  Patient is well-appearing, stable, and appropriate for discharge at this time.  This chart was dictated using voice recognition software, Dragon. Despite the best  efforts of this provider to proofread and correct errors, errors may still occur which can change documentation meaning.   Final Clinical Impression(s) / ED Diagnoses Final diagnoses:  Hypertension, unspecified type    Rx / DC Orders ED Discharge Orders          Ordered    amLODipine (NORVASC) 5 MG tablet        07/01/21 1607             Ladarren Steiner, Eugene Gavia, PA-C 07/01/21 1614    Pollyann Savoy, MD 07/02/21 [phone]

## 2021-07-01 NOTE — ED Triage Notes (Signed)
States his BP has been up and down x 2 weeks. Ringing in his ears x 3 weeks.

## 2021-07-07 ENCOUNTER — Other Ambulatory Visit: Payer: Self-pay

## 2021-07-07 ENCOUNTER — Ambulatory Visit (INDEPENDENT_AMBULATORY_CARE_PROVIDER_SITE_OTHER): Payer: Medicare HMO | Admitting: Family Medicine

## 2021-07-07 ENCOUNTER — Encounter: Payer: Self-pay | Admitting: Family Medicine

## 2021-07-07 VITALS — BP 142/78 | HR 78 | Temp 97.2°F | Ht 72.0 in | Wt 256.4 lb

## 2021-07-07 DIAGNOSIS — I10 Essential (primary) hypertension: Secondary | ICD-10-CM | POA: Insufficient documentation

## 2021-07-07 DIAGNOSIS — Z8719 Personal history of other diseases of the digestive system: Secondary | ICD-10-CM | POA: Diagnosis not present

## 2021-07-07 LAB — AMYLASE: Amylase: 35 U/L (ref 27–131)

## 2021-07-07 LAB — COMPREHENSIVE METABOLIC PANEL
ALT: 21 U/L (ref 0–53)
AST: 17 U/L (ref 0–37)
Albumin: 4.5 g/dL (ref 3.5–5.2)
Alkaline Phosphatase: 72 U/L (ref 39–117)
BUN: 14 mg/dL (ref 6–23)
CO2: 27 mEq/L (ref 19–32)
Calcium: 9.5 mg/dL (ref 8.4–10.5)
Chloride: 102 mEq/L (ref 96–112)
Creatinine, Ser: 1 mg/dL (ref 0.40–1.50)
GFR: 75.5 mL/min (ref >60.00)
Glucose, Bld: 101 mg/dL — ABNORMAL HIGH (ref 70–99)
Potassium: 4.4 mEq/L (ref 3.5–5.1)
Sodium: 139 mEq/L (ref 135–145)
Total Bilirubin: 0.6 mg/dL (ref 0.2–1.2)
Total Protein: 7.4 g/dL (ref 6.0–8.3)

## 2021-07-07 LAB — CBC
HCT: 48.3 % (ref 39.0–52.0)
Hemoglobin: 16.5 g/dL (ref 13.0–17.0)
MCHC: 34.1 g/dL (ref 30.0–36.0)
MCV: 91.6 fl (ref 78.0–100.0)
Platelets: 275 10*3/uL (ref 150.0–400.0)
RBC: 5.27 Mil/uL (ref 4.22–5.81)
RDW: 14.3 % (ref 11.5–15.5)
WBC: 11.2 10*3/uL — ABNORMAL HIGH (ref 4.0–10.5)

## 2021-07-07 LAB — LIPASE: Lipase: 16 U/L (ref 11.0–59.0)

## 2021-07-07 MED ORDER — AMLODIPINE BESYLATE 10 MG PO TABS: 10.0000 mg | ORAL_TABLET | Freq: Every day | ORAL | 0 refills | Status: DC

## 2021-07-07 NOTE — Progress Notes (Signed)
Establishment of care and follow-up  New Patient Office Visit  Subjective:  Patient ID: Benjamin Romero, male    DOB: 07/24/1951  Age: 70 y.o. MRN: [id number]  CC:  Chief Complaint  Patient presents with   Follow-up    Follow up from hospital for hypertension. Patient fasting.     HPI Benjamin Romero presents for establishment of care and follow-up of his blood pressure and recent episode of pancreatitis.  Patient has endured pancreatitis 2 or 3 times.  He believes that for him it is associated with recent high-fat ingestion.  He rarely drinks alcohol and has not had anything to drink over the last 4 months.  History of hypertension.  He was seen in urgent care and started on amlodipine 5 mg.  He was instructed to take 1 or 2 pills depending on his blood pressure readings.  Pressures have been as high as 170/114.  He has stopped adding salt to his food he tells me.  They have also been as low as in the upper 130s over 80s.  He denies headaches blurred vision.  Sometimes gets lightheaded when he stands up quickly.  He has been working on keeping himself hydrated with milk juice and water.  Urine flow is excellent he tells me.  History of apnea as indicated in the chart. He reports 2 normal sleep studies since using his machine back in 2009. 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   Past Medical History:  Diagnosis Date   Colon polyps    Diverticulosis    E-coli UTI 11-02-12   1 month ago- due to diet reducing med used-all clear   Gas    Pancreatitis    Sleep apnea    denies-no cpap used,dx. 2009    Past Surgical History:  Procedure Laterality Date   BACK SURGERY     lumbar   COLONOSCOPY     EUS  12/31/2011   Procedure: UPPER ENDOSCOPIC ULTRASOUND (EUS) LINEAR;  Surgeon: Rob Bunting, MD;  Location: [facility];  Service: Endoscopy;  Laterality: N/A;  radial linear   HAND SURGERY Left    repair traum cut left hand   KNEE SURGERY  bil   TOTAL  KNEE ARTHROPLASTY  11/03/2012   Procedure: TOTAL KNEE ARTHROPLASTY;  Surgeon: Kathryne Hitch, MD;  Location: [facility];  Service: Orthopedics;  Laterality: Left;  Left Total Knee Arthroplasty   TOTAL KNEE REVISION Left 01/05/2014   Procedure: LEFT TOTAL KNEE POLY EXCHANGE;  Surgeon: Kathryne Hitch, MD;  Location: [facility];  Service: Orthopedics;  Laterality: Left;    Family History  Problem Relation Age of Onset   Thyroid cancer Mother     Social History   Socioeconomic History   Marital status: Married    Spouse name: Not on file   Number of children: 2   Years of education: Not on file   Highest education level: Not on file  Occupational History   Not on file  Tobacco Use   Smoking status: Every Day    Packs/day: 2.00    Types: Cigarettes   Smokeless tobacco: Never  Substance and Sexual Activity   Alcohol use: Yes    Comment: pt states "1 beer 4 months ago"   Drug use: No   Sexual activity: Yes  Other Topics Concern   Not on file  Social History Narrative   Not on file   Social Determinants of Health   Financial  Resource Strain: Not on file  Food Insecurity: Not on file  Transportation Needs: Not on file  Physical Activity: Not on file  Stress: Not on file  Social Connections: Not on file  Intimate Partner Violence: Not on file    ROS Review of Systems  Constitutional: Negative.   HENT: Negative.    Eyes:  Negative for photophobia and visual disturbance.  Respiratory: Negative.    Cardiovascular: Negative.   Gastrointestinal: Negative.   Genitourinary:  Negative for difficulty urinating, frequency and urgency.  Neurological:  Positive for light-headedness. Negative for headaches.   Objective:   Today's Vitals: BP (!) 142/78   Pulse 78   Temp (!) 97.2 F (36.2 C) (Temporal)   Ht 6' (1.829 m)   Wt 256 lb 6.4 oz (116.3 kg)   SpO2 97%   BMI 34.77 kg/m   Physical Exam Vitals and nursing note reviewed.  Constitutional:      General: He is  not in acute distress.    Appearance: Normal appearance. He is not ill-appearing, toxic-appearing or diaphoretic.  HENT:     Head: Normocephalic and atraumatic.     Right Ear: Tympanic membrane, ear canal and external ear normal.     Left Ear: Tympanic membrane, ear canal and external ear normal.     Mouth/Throat:     Mouth: Mucous membranes are moist.     Pharynx: Oropharynx is clear. No oropharyngeal exudate or posterior oropharyngeal erythema.  Eyes:     General: No scleral icterus.       Right eye: No discharge.        Left eye: No discharge.     Extraocular Movements: Extraocular movements intact.     Pupils: Pupils are equal, round, and reactive to light.  Cardiovascular:     Rate and Rhythm: Normal rate and regular rhythm.  Pulmonary:     Effort: Pulmonary effort is normal.     Breath sounds: Normal breath sounds.  Abdominal:     General: Bowel sounds are normal.  Musculoskeletal:     Cervical back: No rigidity or tenderness.     Right lower leg: No edema.     Left lower leg: No edema.  Lymphadenopathy:     Cervical: No cervical adenopathy.  Skin:    General: Skin is warm and dry.  Neurological:     Mental Status: He is alert and oriented to person, place, and time.  Psychiatric:        Mood and Affect: Mood normal.        Behavior: Behavior normal.    Assessment & Plan:   Problem List Items Addressed This Visit       Cardiovascular and Mediastinum   Essential hypertension - Primary   Relevant Medications   amLODipine (NORVASC) 10 MG tablet   Other Relevant Orders   CBC   Comprehensive metabolic panel     Other   History of pancreatitis   Relevant Orders   Amylase   CBC   Comprehensive metabolic panel   Lipase    Outpatient Encounter Medications as of 07/07/2021  Medication Sig   amLODipine (NORVASC) 10 MG tablet Take 1 tablet (10 mg total) by mouth daily.   ascorbic acid (VITAMIN C) 1000 MG tablet Take by mouth.   KRILL OIL PO Take by mouth.    Omega-3 Fatty Acids (FISH OIL) 1000 MG CAPS Take by mouth.   Thiamine HCl (VITAMIN B-1 PO) Take 1 tablet by mouth daily.   [DISCONTINUED] amLODipine (  NORVASC) 5 MG tablet Take 5 mg (1 tablet) by mouth if the top number of your blood pressure is >130 at the first blood pressure of the day; take 10 mg (2 tablets) by mouth if the top number of your blood pressure is >160 at the time of your first blood pressure of the day.   [DISCONTINUED] Ascorbic Acid (VITAMIN C PO) Take 1 tablet by mouth daily.   [DISCONTINUED] ondansetron (ZOFRAN ODT) 8 MG disintegrating tablet Take 1 tablet (8 mg total) by mouth every 8 (eight) hours as needed for nausea or vomiting.   [DISCONTINUED] oxyCODONE-acetaminophen (PERCOCET) 10-325 MG tablet Take 1 tablet by mouth every 6 (six) hours as needed for pain.   [DISCONTINUED] VITAMIN A PO Take 1 tablet by mouth daily.   No facility-administered encounter medications on file as of 07/07/2021.    Follow-up: Return in about 2 months (around 09/07/2021), or Return fasting for physical and follow up of blood pressure..  Increased Amlodipine to 10mg  daily. Given info on managing Htn.   , MD

## 2021-07-15 ENCOUNTER — Telehealth: Payer: Self-pay | Admitting: Family Medicine

## 2021-07-15 NOTE — Telephone Encounter (Signed)
Pt called in complaining of his bp being 201/125 for the past two days. It will drop to 165/95 after he takes his medication. His bp will go up anytime he is up walking around. I transferred him over to Nurse Triage.

## 2021-07-16 NOTE — Telephone Encounter (Signed)
Returned patients call to see how BP readings were going, no answer LMTCB

## 2021-07-17 NOTE — Telephone Encounter (Signed)
LMTCB

## 2021-07-21 NOTE — Telephone Encounter (Signed)
Have tried reaching patient several times no answer left several messages to return call needing to check on BP concerns by patient and also go over labs with patient.

## 2021-08-18 ENCOUNTER — Telehealth: Payer: Self-pay | Admitting: Family Medicine

## 2021-08-18 NOTE — Telephone Encounter (Signed)
Left message for patient to call back and schedule Medicare Annual Wellness Visit (AWV). Please offer to do virtually or by telephone.  Left office number and my jabber #[phone]. ? ?Due for AWVI ? ?Please schedule at anytime with Nurse Health Advisor. ?  ?

## 2021-09-08 ENCOUNTER — Ambulatory Visit: Payer: Medicare HMO | Admitting: Family Medicine

## 2021-09-18 ENCOUNTER — Other Ambulatory Visit: Payer: Self-pay | Admitting: Family Medicine

## 2021-09-18 DIAGNOSIS — I10 Essential (primary) hypertension: Secondary | ICD-10-CM

## 2021-09-18 NOTE — Telephone Encounter (Signed)
Chart supports rx refill Last ov: 07/07/2021 Last refill: 07/07/2021

## 2021-10-28 ENCOUNTER — Telehealth: Payer: Self-pay | Admitting: Family Medicine

## 2021-10-28 NOTE — Telephone Encounter (Signed)
Left message for patient to call back and schedule Medicare Annual Wellness Visit (AWV) in office.  ? ?If not able to come in office, please offer to do virtually or by telephone.  Left office number and my jabber #[phone]. ? ?Last AWV:12/21/2009 ? ?Please schedule at anytime with Nurse Health Advisor. ?  ?

## 2021-12-23 ENCOUNTER — Telehealth: Payer: Self-pay | Admitting: Family Medicine

## 2021-12-23 NOTE — Telephone Encounter (Signed)
Left message for patient to call back and schedule Medicare Annual Wellness Visit (AWV) in office.  ° °If not able to come in office, please offer to do virtually or by telephone.  Left office number and my jabber #[phone]. ° °Due for AWVI ° °Please schedule at anytime with Nurse Health Advisor. °  °

## 2021-12-30 ENCOUNTER — Other Ambulatory Visit: Payer: Self-pay

## 2021-12-30 ENCOUNTER — Ambulatory Visit
Admission: EM | Admit: 2021-12-30 | Discharge: 2021-12-30 | Disposition: A | Discharge status: Home / Self Care | Payer: Medicare HMO | Attending: Internal Medicine | Admitting: Internal Medicine

## 2021-12-30 ENCOUNTER — Encounter: Payer: Self-pay | Admitting: Emergency Medicine

## 2021-12-30 DIAGNOSIS — N3001 Acute cystitis with hematuria: Secondary | ICD-10-CM | POA: Diagnosis not present

## 2021-12-30 DIAGNOSIS — R350 Frequency of micturition: Secondary | ICD-10-CM | POA: Diagnosis not present

## 2021-12-30 LAB — POCT URINALYSIS DIP (MANUAL ENTRY)
Bilirubin, UA: NEGATIVE
Glucose, UA: NEGATIVE mg/dL
Ketones, POC UA: NEGATIVE mg/dL
Nitrite, UA: POSITIVE — AB
Protein Ur, POC: 100 mg/dL — AB
Spec Grav, UA: >=1.03 — AB (ref 1.010–1.025)
Urobilinogen, UA: 0.2 E.U./dL
pH, UA: 5.5 (ref 5.0–8.0)

## 2021-12-30 MED ORDER — SULFAMETHOXAZOLE-TRIMETHOPRIM 800-160 MG PO TABS: 1.0000 | ORAL_TABLET | Freq: Two times a day (BID) | ORAL | 0 refills | Status: AC

## 2021-12-30 NOTE — Discharge Instructions (Addendum)
You have a urinary tract infection which is being treated with Bactrim antibiotic.  Urine culture is pending.

## 2021-12-30 NOTE — ED Provider Notes (Signed)
[facility]    CSN: [id number] Arrival date & time: 12/30/21  1024      History   Chief Complaint Chief Complaint  Patient presents with   Possible UTI    HPI Benjamin Romero is a 71 y.o. male.   Patient presents with 1 week history of urinary frequency.  He reports that he went to get a DOT physical today, and the examiner told him that it looks like he has a urinary tract infection with his urinalysis.  He denies urinary burning, hematuria, pelvic pain, abdominal pain, fever, back pain.  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    Past Medical History:  Diagnosis Date   Colon polyps    Diverticulosis    E-coli UTI 11-02-12   1 month ago- due to diet reducing med used-all clear   Gas    Pancreatitis    Sleep apnea    denies-no cpap used,dx. 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   Arthritis of knee, left 11/03/2012   Nonspecific (abnormal) findings on radiological and other examination of gastrointestinal tract 12/31/2011   Unspecified gastritis and gastroduodenitis without mention of hemorrhage 12/31/2011   Duodenitis 12/31/2011   Personal history of colonic polyps 11/10/2011   RESTLESS LEG SYNDROME 03/01/2008   OBESITY 02/09/2008   OBSTRUCTIVE SLEEP APNEA 02/09/2008   Acute pancreatitis 01/30/2008    Past Surgical History:  Procedure Laterality Date   BACK SURGERY      lumbar   COLONOSCOPY     EUS  12/31/2011   Procedure: UPPER ENDOSCOPIC ULTRASOUND (EUS) LINEAR;  Surgeon: Owens Loffler, MD;  Location: [facility];  Service: Endoscopy;  Laterality: N/A;  radial linear   HAND SURGERY Left    repair traum cut left hand   KNEE SURGERY  bil   TOTAL KNEE ARTHROPLASTY  11/03/2012   Procedure: TOTAL KNEE ARTHROPLASTY;  Surgeon: Mcarthur Rossetti, MD;  Location: [facility];  Service: Orthopedics;  Laterality: Left;  Left Total Knee Arthroplasty   TOTAL KNEE REVISION Left 01/05/2014   Procedure: LEFT TOTAL KNEE POLY EXCHANGE;  Surgeon: Mcarthur Rossetti, MD;  Location: [facility];  Service: Orthopedics;  Laterality: Left;       Home Medications    Prior to Admission medications   Medication Sig Start Date End Date Taking? Authorizing Provider  amLODipine (NORVASC) 10 MG tablet Take 1 tablet by mouth once daily 09/18/21  Yes Libby Maw, MD  ascorbic acid (VITAMIN C) 1000 MG tablet Take by mouth.   Yes [provider]  KRILL OIL PO Take by mouth.   Yes [provider]  Omega-3 Fatty Acids (FISH OIL) 1000 MG CAPS Take by mouth.   Yes [provider]  sulfamethoxazole-trimethoprim (BACTRIM DS) 800-160 MG tablet Take 1 tablet by mouth 2 (two) times daily for 7 days. 12/30/21 01/06/22 Yes Teodora Medici, FNP  Thiamine HCl (  VITAMIN B-1 PO) Take 1 tablet by mouth daily.   Yes [provider]    Family History Family History  Problem Relation Age of Onset   Thyroid cancer Mother     Social History Social History   Tobacco Use   Smoking status: Every Day    Packs/day: 2.00    Types: Cigarettes   Smokeless tobacco: Never  Substance Use Topics   Alcohol use: Yes    Comment: pt states "1 beer 4 months ago"   Drug use: No     Allergies   Patient has no known allergies.   Review of Systems Review of Systems Per HPI  Physical Exam Triage Vital Signs ED Triage Vitals  Enc Vitals Group     BP 12/30/21  1045 133/81     Pulse Rate 12/30/21 1045 64     Resp 12/30/21 1045 20     Temp 12/30/21 1045 98 F (36.7 C)     Temp Source 12/30/21 1045 Oral     SpO2 12/30/21 1045 98 %     Weight 12/30/21 1046 256 lb 6.3 oz (116.3 kg)     Height 12/30/21 1046 6' (1.829 m)     Head Circumference --      Peak Flow --      Pain Score 12/30/21 1046 0     Pain Loc --      Pain Edu? --      Excl. in [place]? --    No data found.  Updated Vital Signs BP 133/81 (BP Location: Right Arm)    Pulse 64    Temp 98 F (36.7 C) (Oral)    Resp 20    Ht 6' (1.829 m)    Wt 256 lb 6.3 oz (116.3 kg)    SpO2 98%    BMI 34.77 kg/m   Visual Acuity Right Eye Distance:   Left Eye Distance:   Bilateral Distance:    Right Eye Near:   Left Eye Near:    Bilateral Near:     Physical Exam Constitutional:      General: He is not in acute distress.    Appearance: Normal appearance. He is not toxic-appearing or diaphoretic.  HENT:     Head: Normocephalic and atraumatic.  Eyes:     Extraocular Movements: Extraocular movements intact.     Conjunctiva/sclera: Conjunctivae normal.  Cardiovascular:     Rate and Rhythm: Normal rate and regular rhythm.     Pulses: Normal pulses.     Heart sounds: Normal heart sounds.  Pulmonary:     Effort: Pulmonary effort is normal. No respiratory distress.     Breath sounds: Normal breath sounds.  Abdominal:     General: Bowel sounds are normal. There is no distension.     Palpations: Abdomen is soft.  Neurological:     General: No focal deficit present.     Mental Status: He is alert and oriented to person, place, and time. Mental status is at baseline.  Psychiatric:        Mood and Affect: Mood normal.        Behavior: Behavior normal.        Thought Content: Thought content normal.        Judgment: Judgment normal.     UC Treatments / Results  Labs (all labs ordered are listed, but only abnormal results are displayed) Labs Reviewed  POCT URINALYSIS DIP (MANUAL ENTRY) -  Abnormal; Notable for the following components:  Result Value   Clarity, UA cloudy (*)    Spec Grav, UA >=1.030 (*)    Blood, UA moderate (*)    Protein Ur, POC =100 (*)    Nitrite, UA Positive (*)    Leukocytes, UA Small (1+) (*)    All other components within normal limits  URINE CULTURE    EKG   Radiology No results found.  Procedures Procedures (including critical care time)  Medications Ordered in UC Medications - No data to display  Initial Impression / Assessment and Plan / UC Course  I have reviewed the triage vital signs and the nursing notes.  Pertinent labs & imaging results that were available during my care of the patient were reviewed by me and considered in my medical decision making (see chart for details).     Urinalysis indicating urinary tract infection.  Will treat with Bactrim.  Urine culture pending.  Patient to increase water intake.  Discussed strict return precautions.  Patient verbalized understanding and was agreeable with plan. Final Clinical Impressions(s) / UC Diagnoses   Final diagnoses:  Acute cystitis with hematuria  Urinary frequency     Discharge Instructions      You have a urinary tract infection which is being treated with Bactrim antibiotic.  Urine culture is pending.    ED Prescriptions     Medication Sig Dispense Auth. Provider   sulfamethoxazole-trimethoprim (BACTRIM DS) 800-160 MG tablet Take 1 tablet by mouth 2 (two) times daily for 7 days. 14 tablet [place], Michele Rockers, [place]      PDMP not reviewed this encounter.   Teodora Medici, [place] 12/30/21 1210

## 2021-12-30 NOTE — ED Triage Notes (Signed)
Patient c/o urinary frequency x 1 week, possible UTI, some hematuria.  Denies any OTC meds.

## 2022-01-01 LAB — URINE CULTURE: Culture: >=100000 — AB

## 2022-01-19 ENCOUNTER — Emergency Department (HOSPITAL_BASED_OUTPATIENT_CLINIC_OR_DEPARTMENT_OTHER): Payer: Medicare HMO | Admitting: Radiology

## 2022-01-19 ENCOUNTER — Ambulatory Visit
Admission: EM | Admit: 2022-01-19 | Discharge: 2022-01-19 | Disposition: A | Discharge status: Home / Self Care | Payer: Medicare HMO | Attending: Urgent Care | Admitting: Urgent Care

## 2022-01-19 ENCOUNTER — Encounter (HOSPITAL_BASED_OUTPATIENT_CLINIC_OR_DEPARTMENT_OTHER): Payer: Self-pay | Admitting: Urology

## 2022-01-19 ENCOUNTER — Emergency Department (HOSPITAL_BASED_OUTPATIENT_CLINIC_OR_DEPARTMENT_OTHER): Payer: Medicare HMO

## 2022-01-19 ENCOUNTER — Emergency Department (HOSPITAL_BASED_OUTPATIENT_CLINIC_OR_DEPARTMENT_OTHER)
Admission: EM | Admit: 2022-01-19 | Discharge: 2022-01-19 | Disposition: A | Discharge status: Home / Self Care | Payer: Medicare HMO | Attending: Emergency Medicine | Admitting: Emergency Medicine

## 2022-01-19 ENCOUNTER — Ambulatory Visit (INDEPENDENT_AMBULATORY_CARE_PROVIDER_SITE_OTHER): Payer: Medicare HMO

## 2022-01-19 ENCOUNTER — Other Ambulatory Visit: Payer: Self-pay

## 2022-01-19 ENCOUNTER — Encounter: Payer: Self-pay | Admitting: Emergency Medicine

## 2022-01-19 DIAGNOSIS — Z20822 Contact with and (suspected) exposure to covid-19: Secondary | ICD-10-CM | POA: Insufficient documentation

## 2022-01-19 DIAGNOSIS — I700 Atherosclerosis of aorta: Secondary | ICD-10-CM | POA: Diagnosis not present

## 2022-01-19 DIAGNOSIS — K859 Acute pancreatitis without necrosis or infection, unspecified: Secondary | ICD-10-CM

## 2022-01-19 DIAGNOSIS — R1084 Generalized abdominal pain: Secondary | ICD-10-CM | POA: Diagnosis not present

## 2022-01-19 DIAGNOSIS — R109 Unspecified abdominal pain: Secondary | ICD-10-CM | POA: Diagnosis not present

## 2022-01-19 DIAGNOSIS — R1030 Lower abdominal pain, unspecified: Secondary | ICD-10-CM

## 2022-01-19 DIAGNOSIS — R059 Cough, unspecified: Secondary | ICD-10-CM | POA: Diagnosis not present

## 2022-01-19 DIAGNOSIS — R112 Nausea with vomiting, unspecified: Secondary | ICD-10-CM | POA: Diagnosis not present

## 2022-01-19 DIAGNOSIS — R1013 Epigastric pain: Secondary | ICD-10-CM | POA: Diagnosis not present

## 2022-01-19 LAB — COMPREHENSIVE METABOLIC PANEL
ALT: 9 U/L (ref 0–44)
AST: 10 U/L — ABNORMAL LOW (ref 15–41)
Albumin: 4 g/dL (ref 3.5–5.0)
Alkaline Phosphatase: 69 U/L (ref 38–126)
Anion gap: 10 (ref 5–15)
BUN: 7 mg/dL — ABNORMAL LOW (ref 8–23)
CO2: 23 mmol/L (ref 22–32)
Calcium: 9.2 mg/dL (ref 8.9–10.3)
Chloride: 105 mmol/L (ref 98–111)
Creatinine, Ser: 0.97 mg/dL (ref 0.61–1.24)
GFR, Estimated: >60 mL/min (ref >60)
Glucose, Bld: 97 mg/dL (ref 70–99)
Potassium: 4.2 mmol/L (ref 3.5–5.1)
Sodium: 138 mmol/L (ref 135–145)
Total Bilirubin: 0.5 mg/dL (ref 0.3–1.2)
Total Protein: 7.4 g/dL (ref 6.5–8.1)

## 2022-01-19 LAB — CBC
HCT: 43 % (ref 39.0–52.0)
Hemoglobin: 14.7 g/dL (ref 13.0–17.0)
MCH: 30.4 pg (ref 26.0–34.0)
MCHC: 34.2 g/dL (ref 30.0–36.0)
MCV: 89 fL (ref 80.0–100.0)
Platelets: 293 10*3/uL (ref 150–400)
RBC: 4.83 MIL/uL (ref 4.22–5.81)
RDW: 14.2 % (ref 11.5–15.5)
WBC: 12.5 10*3/uL — ABNORMAL HIGH (ref 4.0–10.5)
nRBC: 0 % (ref 0.0–0.2)

## 2022-01-19 LAB — POCT URINALYSIS DIP (MANUAL ENTRY)
Bilirubin, UA: NEGATIVE
Glucose, UA: NEGATIVE mg/dL
Ketones, POC UA: NEGATIVE mg/dL
Leukocytes, UA: NEGATIVE
Nitrite, UA: NEGATIVE
Protein Ur, POC: NEGATIVE mg/dL
Spec Grav, UA: 1.02 (ref 1.010–1.025)
Urobilinogen, UA: 0.2 E.U./dL
pH, UA: 7.5 (ref 5.0–8.0)

## 2022-01-19 LAB — RESP PANEL BY RT-PCR (FLU A&B, COVID) ARPGX2
Influenza A by PCR: NEGATIVE
Influenza B by PCR: NEGATIVE
SARS Coronavirus 2 by RT PCR: NEGATIVE

## 2022-01-19 LAB — LIPASE, BLOOD: Lipase: 20 U/L (ref 11–51)

## 2022-01-19 MED ORDER — HYDROCODONE-ACETAMINOPHEN 5-325 MG PO TABS: 2.0000 | ORAL_TABLET | ORAL | 0 refills | Status: DC | PRN

## 2022-01-19 MED ORDER — FENTANYL CITRATE PF 50 MCG/ML IJ SOSY: 50.0000 ug | PREFILLED_SYRINGE | Freq: Once | INTRAMUSCULAR | Status: DC

## 2022-01-19 MED ORDER — IOHEXOL 300 MG/ML  SOLN
100.0000 mL | Freq: Once | INTRAMUSCULAR | Status: AC | PRN
  Administered 2022-01-19: 100 mL via INTRAVENOUS

## 2022-01-19 MED ORDER — FENTANYL CITRATE PF 50 MCG/ML IJ SOSY
50.0000 ug | PREFILLED_SYRINGE | INTRAMUSCULAR | Status: DC | PRN
  Administered 2022-01-19: 50 ug via INTRAVENOUS
  Filled 2022-01-19: qty 1

## 2022-01-19 MED ORDER — ONDANSETRON HCL 4 MG/2ML IJ SOLN: 4.0000 mg | Freq: Three times a day (TID) | INTRAMUSCULAR | Status: DC | PRN

## 2022-01-19 MED ORDER — ONDANSETRON 4 MG PO TBDP: 4.0000 mg | ORAL_TABLET | Freq: Three times a day (TID) | ORAL | 0 refills | Status: DC | PRN

## 2022-01-19 MED ORDER — ONDANSETRON HCL 4 MG/2ML IJ SOLN: 4.0000 mg | Freq: Once | INTRAMUSCULAR | Status: DC

## 2022-01-19 MED ORDER — LACTATED RINGERS IV BOLUS
1000.0000 mL | Freq: Once | INTRAVENOUS | Status: AC
  Administered 2022-01-19: 1000 mL via INTRAVENOUS

## 2022-01-19 NOTE — ED Provider Notes (Signed)
[facility]    CSN: [id number] Arrival date & time: 01/19/22  1217      History   Chief Complaint No chief complaint on file.   HPI Benjamin Romero is a 71 y.o. male.   Pleasant 71 year old male presents today with concerns of lower abdominal discomfort, vomiting over the past 2 days.  He was recently seen for UTI, reports he took all of his medications.  He denies any dysuria currently.  He states that his stool has been much harder than usual, he had a bowel movement this morning and reports it was "like a hard painful baseball".  He does have a history of pancreatitis, but denies any alcohol use.  He denies any left upper quadrant pain.  He denies any fevers.  He does have a history of colon polyps and diverticulosis as well.  He also admits to history of hematuria ever since having his vasectomy many years ago.  He states he has had a "million dollar work-up", of which no one can determine why he is still having hematuria.  Patient states he has felt slightly dizzy upon standing over the past several days. He states he is having a hard time keeping food and liquids down. He reported after completing the KUB the sensation of light-headedness and sensation of vomiting upon walking back to his room.    Past Medical History:  Diagnosis Date   Colon polyps    Diverticulosis    E-coli UTI 11-02-12   1 month ago- due to diet reducing med used-all clear   Gas    Pancreatitis    Sleep apnea    denies-no cpap used,dx. 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/2014   Arthritis of knee, left 11/03/2012   Nonspecific (abnormal) findings on radiological and other examination of gastrointestinal tract 12/31/2011   Unspecified gastritis and gastroduodenitis without mention of hemorrhage 12/31/2011   Duodenitis 12/31/2011   Personal history of colonic polyps 11/10/2011   RESTLESS LEG SYNDROME 03/01/2008   OBESITY 02/09/2008   OBSTRUCTIVE SLEEP APNEA 02/09/2008   Acute pancreatitis 01/30/2008    Past Surgical History:  Procedure Laterality Date   BACK SURGERY     lumbar   COLONOSCOPY     EUS  12/31/2011   Procedure: UPPER ENDOSCOPIC ULTRASOUND (EUS) LINEAR;  Surgeon: Owens Loffler, MD;  Location: [facility];  Service: Endoscopy;  Laterality: N/A;  radial linear   HAND SURGERY Left    repair traum cut left hand   KNEE SURGERY  bil   TOTAL KNEE ARTHROPLASTY  11/03/2012   Procedure: TOTAL KNEE ARTHROPLASTY;  Surgeon: Mcarthur Rossetti, MD;  Location: [facility];  Service: Orthopedics;  Laterality: Left;  Left Total Knee Arthroplasty   TOTAL KNEE REVISION Left 01/05/2014   Procedure: LEFT TOTAL KNEE POLY EXCHANGE;  Surgeon: Mcarthur Rossetti, MD;  Location: [facility];  Service: Orthopedics;  Laterality: Left;       Home Medications    Prior to Admission medications  Medication Sig Start Date End Date Taking? Authorizing Provider  amLODipine (NORVASC) 10 MG tablet Take 1 tablet by mouth once daily 09/18/21   Libby Maw, MD  ascorbic acid (VITAMIN C) 1000 MG tablet Take by mouth.    [provider]  KRILL OIL PO Take by mouth.    [provider]  Omega-3 Fatty Acids (FISH OIL) 1000 MG CAPS Take by mouth.    [provider]  Thiamine HCl (VITAMIN B-1 PO) Take 1 tablet by mouth daily.    [provider]    Family History Family History  Problem Relation Age of Onset   Thyroid cancer Mother     Social History Social  History   Tobacco Use   Smoking status: Every Day    Packs/day: 2.00    Types: Cigarettes   Smokeless tobacco: Never  Substance Use Topics   Alcohol use: Yes    Comment: pt states "1 beer 4 months ago"   Drug use: No     Allergies   Patient has no known allergies.   Review of Systems Review of Systems  Constitutional:  Positive for appetite change.  Gastrointestinal:  Positive for abdominal pain, constipation, nausea and vomiting.  Neurological:  Positive for dizziness and light-headedness.    Physical Exam Triage Vital Signs ED Triage Vitals [01/19/22 1359]  Enc Vitals Group     BP (!) 151/109     Pulse Rate 67     Resp 18     Temp 97.8 F (36.6 C)     Temp Source Oral     SpO2 97 %     Weight      Height      Head Circumference      Peak Flow      Pain Score 8     Pain Loc      Pain Edu?      Excl. in [place]?    No data found.  Updated Vital Signs BP (!) 151/109 (BP Location: Left Arm)    Pulse 67    Temp 97.8 F (36.6 C) (Oral)    Resp 18    SpO2 97%   Visual Acuity Right Eye Distance:   Left Eye Distance:   Bilateral Distance:    Right Eye Near:   Left Eye Near:    Bilateral Near:     Physical Exam Vitals and nursing note reviewed.  Constitutional:      General: He is not in acute distress.    Appearance: Normal appearance. He is obese. He is not toxic-appearing.  HENT:     Head: Normocephalic and atraumatic.     Right Ear: Tympanic membrane, ear canal and external ear normal. There is no impacted cerumen.     Left Ear: Tympanic membrane, ear canal and external ear normal. There is no impacted cerumen.     Nose: Nose normal. No congestion or rhinorrhea.     Mouth/Throat:     Mouth: Mucous membranes are dry.     Pharynx: Oropharynx is clear. No oropharyngeal exudate or posterior oropharyngeal erythema.  Eyes:     General: No scleral icterus.       Right eye: No discharge.        Left eye: No discharge.     Extraocular Movements: Extraocular  movements intact.     Conjunctiva/sclera: Conjunctivae normal.     Pupils: Pupils are equal, round, and reactive to light.  Cardiovascular:     Rate and  Rhythm: Normal rate and regular rhythm.     Pulses: Normal pulses.     Heart sounds: No murmur heard. Pulmonary:     Effort: Pulmonary effort is normal. No respiratory distress.     Breath sounds: Normal breath sounds. No wheezing, rhonchi or rales.  Chest:     Chest wall: No tenderness.  Abdominal:     General: Abdomen is flat. There is distension.     Palpations: There is no mass.     Tenderness: There is abdominal tenderness (generalized, worse to both R and L lower quadrants). There is no right CVA tenderness, left CVA tenderness, guarding or rebound.     Hernia: No hernia is present.  Musculoskeletal:     Cervical back: Normal range of motion. No tenderness.  Lymphadenopathy:     Cervical: No cervical adenopathy.  Skin:    General: Skin is warm.     Findings: No erythema or rash.  Neurological:     Mental Status: He is alert.     UC Treatments / Results  Labs (all labs ordered are listed, but only abnormal results are displayed) Labs Reviewed  POCT URINALYSIS DIP (MANUAL ENTRY) - Abnormal; Notable for the following components:      Result Value   Clarity, UA cloudy (*)    Blood, UA trace-intact (*)    All other components within normal limits    EKG   Radiology DG Abd 2 Views  Result Date: 01/19/2022 CLINICAL DATA:  Persistent lower abdominal pain and hard stools. EXAM: ABDOMEN - 2 VIEW COMPARISON:  CT 02/24/2021 FINDINGS: Lung bases are clear. Abdominal and pelvic gas pattern shows a moderate amount of intestinal gas but no sign of obstruction or free air. Amount of fecal matter is within the range of normal. No abnormal calcifications or bone findings. IMPRESSION: Moderate amount of gas and stool, but within the overall range of normal. No sign of obstruction or free air. Electronically Signed   By: Nelson Chimes  M.D.   On: 01/19/2022 15:09    Procedures Procedures (including critical care time)  Medications Ordered in UC Medications - No data to display  Initial Impression / Assessment and Plan / UC Course  I have reviewed the triage vital signs and the nursing notes.  Pertinent labs & imaging results that were available during my care of the patient were reviewed by me and considered in my medical decision making (see chart for details).     Generalized abdominal pain - in light of your hx of pancreatitis, diverticulosis, and new onset abdominal pain with inability to tolerate food or liquids, ER workup would be preferred to obtain CT scan and labs. N/V - pt does not have sx consistent with influenza. Will defer further workup / management to ER Hx UTI - appears adequately treated, UA WNL Dizziness - possibly secondary to dehydration. Pt does not appear septic, although I would like additional labs performed to assess for other causes.  Final Clinical Impressions(s) / UC Diagnoses   Final diagnoses:  Generalized abdominal pain  Nausea and vomiting, unspecified vomiting type     Discharge Instructions      You are instructed to head to the emergency room. It is recommended that we obtain a CT scan given your GI history I also would like lab work to be obtained today including a CBC, CMP, lipase. Please go straight to the ER for further workup     ED Prescriptions   None  PDMP not reviewed this encounter.   Chaney Malling, [place] 01/19/22 1541

## 2022-01-19 NOTE — ED Triage Notes (Addendum)
States he was recently seen for a UTI, that he took the meds, but that he felt like his symptoms weren't improving. Reports he began vomiting 1/28, and LLQ abdominal pain starting around the same time that is so bad it takes his breath away. Reports he has been more constipated than usual. LLQ pain constant, sharp, relieved by laying down, exacerbated by pressure/coughing/sitting up. Also reports new cough with yellow mucus starting last Thursday, is an every day smoker. States the last time this happened, it felt like his pancreatitis

## 2022-01-19 NOTE — ED Triage Notes (Signed)
Sent by UC for LLQ abdominal pain and need for CT scan  Vomiting x 2 days.  States productive cough x 2 days as well Has been constipated but had BM this morning.  Denies fever NAD now A& O x4

## 2022-01-19 NOTE — ED Provider Notes (Signed)
[facility] EMERGENCY DEPT Provider Note   CSN: [id number] Arrival date & time: 01/19/22  1610     History  Chief Complaint  Patient presents with   Abdominal Pain    Benjamin Romero is a 71 y.o. male.   Abdominal Pain  71 year old male with a history of colon polyps, diverticulosis, pancreatitis, E. coli UTIs, OSA who presents to the emergency department with abdominal pain.  The patient states that he was sent by urgent care for abdominal pain and the need for CT scan.  He has had roughly 2 days of lower abdominal pain, nausea, vomiting.  He was recently seen for urinary tract infection but finished his antibiotic medications.  He denies any dysuria or increased urinary frequency.  Endorses combination of epigastric abdominal discomfort in addition to lower abdominal discomfort.  He is tolerating small bits of oral intake.  Is able to tolerate fluids.  He denies any fevers or chills.  He is passing gas and had a bowel movement this morning.  Home Medications Prior to Admission medications   Medication Sig Start Date End Date Taking? Authorizing Provider  HYDROcodone-acetaminophen (NORCO/VICODIN) 5-325 MG tablet Take 2 tablets by mouth every 4 (four) hours as needed. 01/19/22  Yes Ernie Avena, MD  ondansetron (ZOFRAN-ODT) 4 MG disintegrating tablet Take 1 tablet (4 mg total) by mouth every 8 (eight) hours as needed for nausea or vomiting. 01/19/22  Yes Ernie Avena, MD  amLODipine (NORVASC) 10 MG tablet Take 1 tablet by mouth once daily 09/18/21   Mliss Sax, MD  ascorbic acid (VITAMIN C) 1000 MG tablet Take by mouth.    [provider]  KRILL OIL PO Take by mouth.    [provider]  Omega-3 Fatty Acids (FISH OIL) 1000 MG CAPS Take by mouth.    [provider]  Thiamine HCl (VITAMIN B-1 PO) Take 1 tablet by mouth daily.    [provider]      Allergies    Patient has no known allergies.    Review of Systems    Review of Systems  Gastrointestinal:  Positive for abdominal pain.  All other systems reviewed and are negative.  Physical Exam Updated Vital Signs BP (!) 158/89    Pulse (!) 56    Temp 98.1 F (36.7 C)    Resp 16    Ht 6' (1.829 m)    Wt 116.3 kg    SpO2 100%    BMI 34.77 kg/m  Physical Exam Vitals and nursing note reviewed.  Constitutional:      General: He is not in acute distress. HENT:     Head: Normocephalic and atraumatic.  Eyes:     Conjunctiva/sclera: Conjunctivae normal.     Pupils: Pupils are equal, round, and reactive to light.  Cardiovascular:     Rate and Rhythm: Normal rate and regular rhythm.  Pulmonary:     Effort: Pulmonary effort is normal. No respiratory distress.  Abdominal:     General: There is no distension.     Tenderness: There is abdominal tenderness in the epigastric area. There is no guarding or rebound.  Musculoskeletal:        General: No deformity or signs of injury.     Cervical back: Neck supple.  Skin:    Findings: No lesion or rash.  Neurological:     General: No focal deficit present.     Mental Status: He is alert. Mental status is at baseline.    ED  Results / Procedures / Treatments   Labs (all labs ordered are listed, but only abnormal results are displayed) Labs Reviewed  COMPREHENSIVE METABOLIC PANEL - Abnormal; Notable for the following components:      Result Value   BUN 7 (*)    AST 10 (*)    All other components within normal limits  CBC - Abnormal; Notable for the following components:   WBC 12.5 (*)    All other components within normal limits  RESP PANEL BY RT-PCR (FLU A&B, COVID) [id number]  LIPASE, BLOOD    EKG EKG Interpretation  Date/Time:  Monday January 19 2022 18:51:55 EST Ventricular Rate:  63 PR Interval:  173 QRS Duration: 92 QT Interval:  435 QTC Calculation: 446 R Axis:   6 Text Interpretation: Sinus rhythm Confirmed by Ernie AvenaLawsing, Lance Galas ([id number]) on 01/19/2022 7:00:23 PM  Radiology DG Chest 2  View  Result Date: 01/19/2022 CLINICAL DATA:  Productive cough for 2 days EXAM: CHEST - 2 VIEW COMPARISON:  12/15/2019 FINDINGS: Cardiac shadow is stable. Tortuous thoracic aorta is noted. Lungs are well aerated bilaterally. No focal infiltrate is seen. No bony abnormality is noted. IMPRESSION: No acute abnormality seen. Electronically Signed   By: Alcide CleverMark  Lukens M.D.   On: 01/19/2022 19:03   CT ABDOMEN PELVIS W CONTRAST  Result Date: 01/19/2022 CLINICAL DATA:  Left lower quadrant abdominal pain. EXAM: CT ABDOMEN AND PELVIS WITH CONTRAST TECHNIQUE: Multidetector CT imaging of the abdomen and pelvis was performed using the standard protocol following bolus administration of intravenous contrast. RADIATION DOSE REDUCTION: This exam was performed according to the departmental dose-optimization program which includes automated exposure control, adjustment of the mA and/or kV according to patient size and/or use of iterative reconstruction technique. CONTRAST:  100mL OMNIPAQUE IOHEXOL 300 MG/ML  SOLN COMPARISON:  02/24/2021 FINDINGS: Lower chest: No acute abnormality. Hepatobiliary: No focal liver abnormality is seen. No gallstones, gallbladder wall thickening, or biliary dilatation. Pancreas: Edema and peripancreatic soft tissue stranding involves the head and uncinate process of the pancreas. No signs of main duct dilatation, inflammation, or mass. Spleen: Spleen appears normal. Adrenals/Urinary Tract: Normal appearance of the adrenal glands. There are several cyst within the right kidney. The largest is in the upper pole measuring 2.6 cm, image 30/2. No nephrolithiasis or hydronephrosis identified bilaterally. Urinary bladder is unremarkable. Stomach/Bowel: Stomach appears normal. There is mild soft tissue stranding surrounding the heart zonal portion of the duodenum adjacent to the uncinate process of pancreas. The remaining small bowel loops are unremarkable. The appendix is visualized and is unremarkable. No  colonic wall thickening, inflammation or distension. Vascular/Lymphatic: Aortic atherosclerosis. No aneurysm. No abdominopelvic adenopathy identified. Reproductive: Prostate is unremarkable. Other: No free fluid or fluid collections identified. Musculoskeletal: Degenerative disc disease is noted at L3-4, L4-5 and L5-S1. No acute or suspicious osseous findings. IMPRESSION: 1. Edema and peripancreatic soft tissue stranding involves the head and uncinate process of pancreas. Findings are compatible with acute pancreatitis. No signs of main duct dilatation, mass or pseudocyst formation. 2. Lumbar degenerative disc disease. 3. Aortic Atherosclerosis (ICD10-I70.0). Electronically Signed   By: Signa Kellaylor  Stroud M.D.   On: 01/19/2022 18:14   DG Abd 2 Views  Result Date: 01/19/2022 CLINICAL DATA:  Persistent lower abdominal pain and hard stools. EXAM: ABDOMEN - 2 VIEW COMPARISON:  CT 02/24/2021 FINDINGS: Lung bases are clear. Abdominal and pelvic gas pattern shows a moderate amount of intestinal gas but no sign of obstruction or free air. Amount of fecal matter is within the range of  normal. No abnormal calcifications or bone findings. IMPRESSION: Moderate amount of gas and stool, but within the overall range of normal. No sign of obstruction or free air. Electronically Signed   By: Paulina FusiMark  Shogry M.D.   On: 01/19/2022 15:09    Procedures Procedures    Medications Ordered in ED Medications  lactated ringers bolus 1,000 mL (0 mLs Intravenous Stopped 01/19/22 1938)  iohexol (OMNIPAQUE) 300 MG/ML solution 100 mL (100 mLs Intravenous Contrast Given 01/19/22 1751)    ED Course/ Medical Decision Making/ A&P Clinical Course as of 01/21/22 1305  Mon Jan 19, 2022  1826 WBC(!): 12.5 [JL]    Clinical Course User Index [JL] Ernie AvenaLawsing, Layna Roeper, MD                           Medical Decision Making Amount and/or Complexity of Data Reviewed Labs: ordered. Decision-making details documented in ED Course. Radiology:  ordered.  Risk Prescription drug management.   71 year old male with a history of colon polyps, diverticulosis, pancreatitis, E. coli UTIs, OSA who presents to the emergency department with abdominal pain.  The patient states that he was sent by urgent care for abdominal pain and the need for CT scan.  He has had roughly 2 days of lower abdominal pain, nausea, vomiting.  He was recently seen for urinary tract infection but finished his antibiotic medications.  He denies any dysuria or increased urinary frequency.  Endorses combination of epigastric abdominal discomfort in addition to lower abdominal discomfort.  He is tolerating small bits of oral intake.  Is able to tolerate fluids.  He denies any fevers or chills.  He is passing gas and had a bowel movement this morning.  On arrival, the patient was afebrile, hemodynamically stable, saturating well on room air.  Sinus rhythm noted on cardiac telemetry.  Physical exam significant for epigastric tenderness to palpation without rebound or guarding.  Differential diagnosis includes pancreatitis, small bowel obstruction, appendicitis, diverticulitis, pyelonephritis.  Work-up initiated to include CBC which revealed a leukocytosis to 12.5, lipase normal, CMP generally unremarkable, COVID-19 and influenza PCR testing negative.  The patient had a chest x-ray which revealed no focal findings which was reviewed and interpreted by myself and radiology.  IV fluids were provided for volume resuscitation.  The patient underwent CT abdomen pelvis which revealed edema and pain.  Pancreatic soft tissue stranding which involves the head and uncinate process of the pancreas.  Findings are compatible with acute pancreatitis.  No signs of main ductal dilatation, mass or pseudocyst formation.  No other abnormalities noted in the abdomen or pelvis.  Given the patient's epigastric tenderness to palpation and CT findings, concern for acute pancreatitis.  The patient was  tolerating oral intake.  He was fluid resuscitated and provided pain medication.  He was offered admission and I discussed with him at length the plan for admission versus discharge home.  The patient would prefer to try discharge home and recovery at home with slow advancement of his diet as he has tried this before and succeeded.  He was provided a prescription for Norco and Zofran and provided return precautions.  Tolerating oral intake on p.o. challenge.  Appears stable for discharge.    Final Clinical Impression(s) / ED Diagnoses Final diagnoses:  Acute pancreatitis, unspecified complication status, unspecified pancreatitis type    Rx / DC Orders ED Discharge Orders          Ordered    HYDROcodone-acetaminophen (NORCO/VICODIN) 5-325 MG  tablet  Every 4 hours PRN        01/19/22 2004    ondansetron (ZOFRAN-ODT) 4 MG disintegrating tablet  Every 8 hours PRN        01/19/22 2007              Ernie Avena, MD 01/21/22 1306

## 2022-01-19 NOTE — Discharge Instructions (Addendum)
You were evaluated in the Emergency Department and after careful evaluation, we did not find any emergent condition requiring admission or further testing in the hospital.  Your exam/testing today was concerning for acute pancreatitis as you had epigastric tenderness on exam and evidence of acute pancreatitis on CT imaging.  You are offered admission but declined.  We will treat outpatient with a clear liquid diet for the next few days, opiates for pain control and slow advancement of your diet as tolerated.  I will also prescribe Zofran for nausea.  Return to the ED for worsening symptoms.  Please return to the Emergency Department if you experience any worsening of your condition.  Thank you for allowing [place] to be a part of your care.

## 2022-01-19 NOTE — Discharge Instructions (Signed)
You are instructed to head to the emergency room. It is recommended that we obtain a CT scan given your GI history I also would like lab work to be obtained today including a CBC, CMP, lipase. Please go straight to the ER for further workup

## 2022-01-19 NOTE — ED Notes (Signed)
Patient is being discharged from the Urgent Care and sent to the Emergency Department via POV . Per Guy Sandifer PA, patient is in need of higher level of care due to need for CT scan, R/O diverticulitis or obstruction. Patient is aware and verbalizes understanding of plan of care.  Vitals:   01/19/22 1359  BP: (!) 151/109  Pulse: 67  Resp: 18  Temp: 97.8 F (36.6 C)  SpO2: 97%

## 2022-01-19 NOTE — ED Notes (Signed)
Patient currently in X-ray will obtain EKG and Swab when patient returns

## 2022-02-15 IMAGING — CT CT ABD-PELV W/ CM
2 of 5 series · 16 of 46 positions shown, 18 images · IV contrast (Omnipaque)
Comparison: 01/14/2019

CLINICAL DATA: Epigastric abdominal pain

EXAM:
CT ABDOMEN AND PELVIS WITH CONTRAST
TECHNIQUE: Multidetector CT imaging of the abdomen and pelvis was performed
using the standard protocol following bolus administration of
intravenous contrast.
CONTRAST:  100mL OMNIPAQUE IOHEXOL 300 MG/ML  SOLN

[Series 2: axial st · axial · 0.88mm/px · z∈[-346,+54]mm · 13 of 91 slices shown, 15 images]
[im 6/91  soft-tissue]
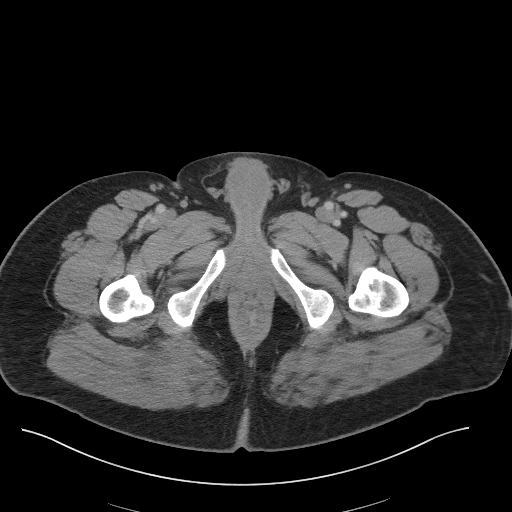
[im 6/91  bone]
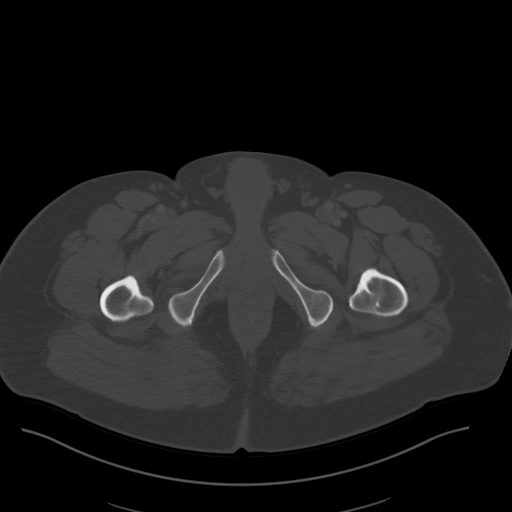
[im 11/91  soft-tissue]
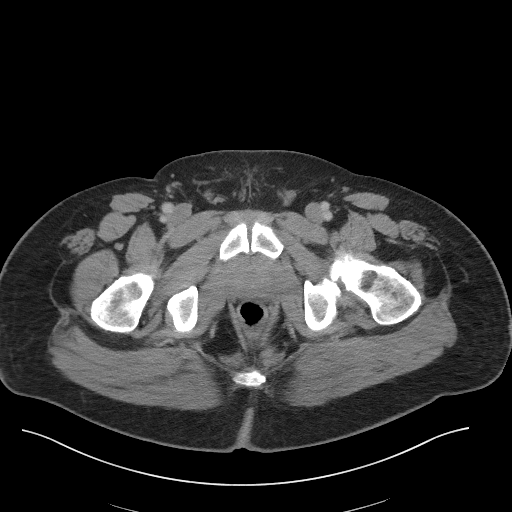
[im 21/91  soft-tissue]
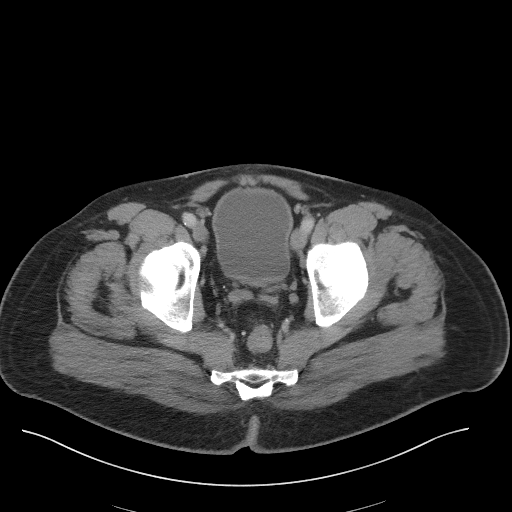
[im 26/91  soft-tissue]
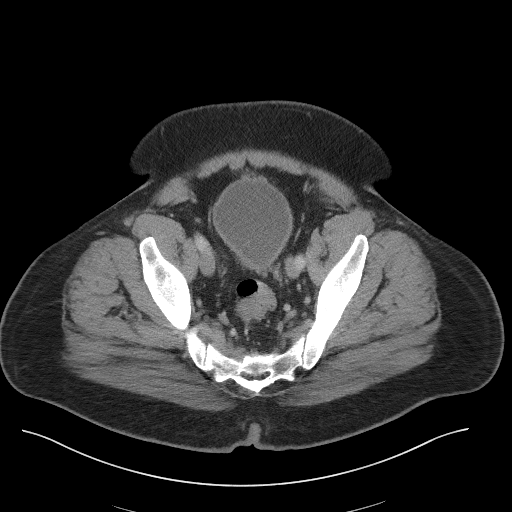
[im 31/91  soft-tissue]
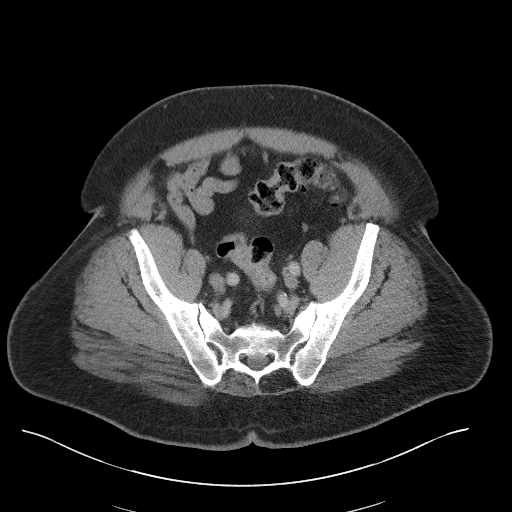
[im 41/91  soft-tissue]
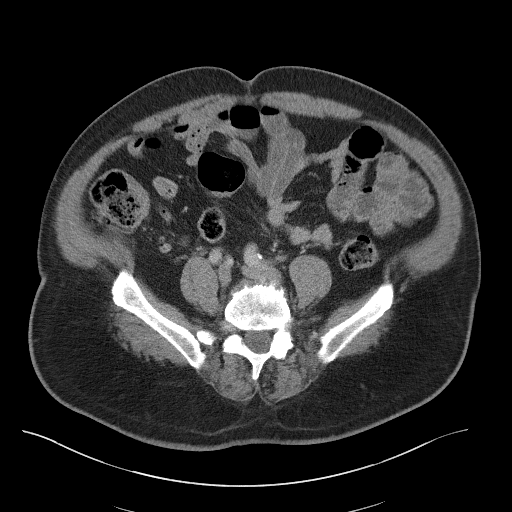
[im 46/91  soft-tissue]
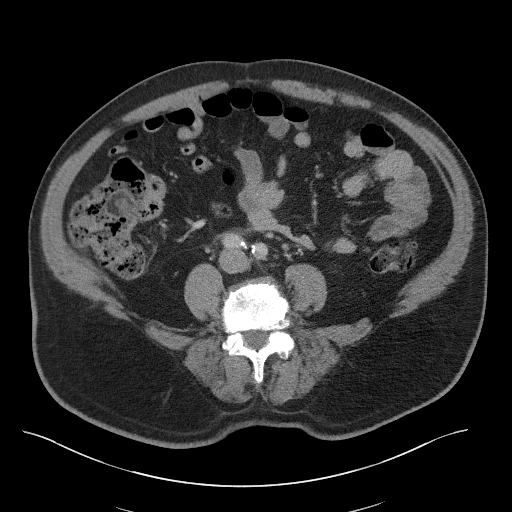
[im 51/91  soft-tissue]
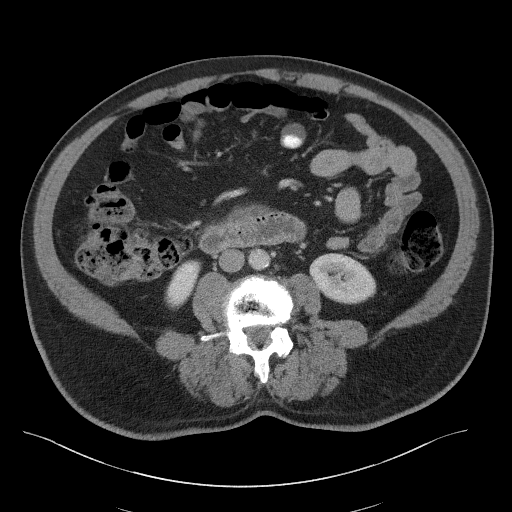
[im 61/91  soft-tissue]
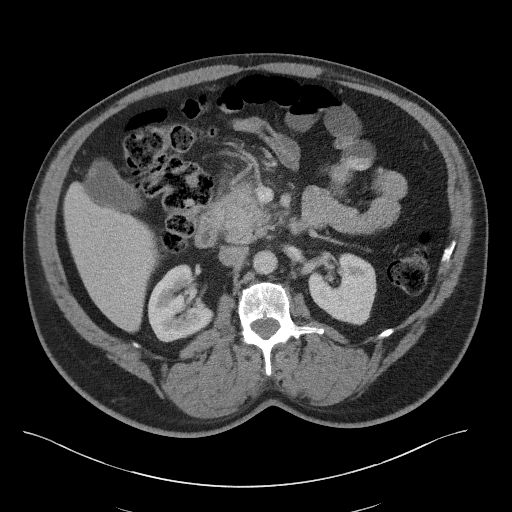
[im 61/91  bone]
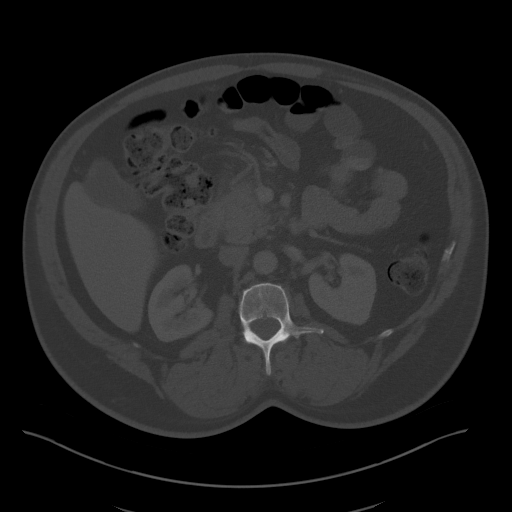
[im 66/91  soft-tissue]
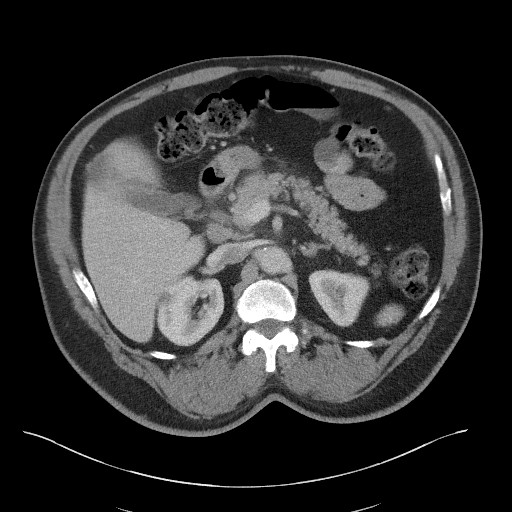
[im 71/91  soft-tissue]
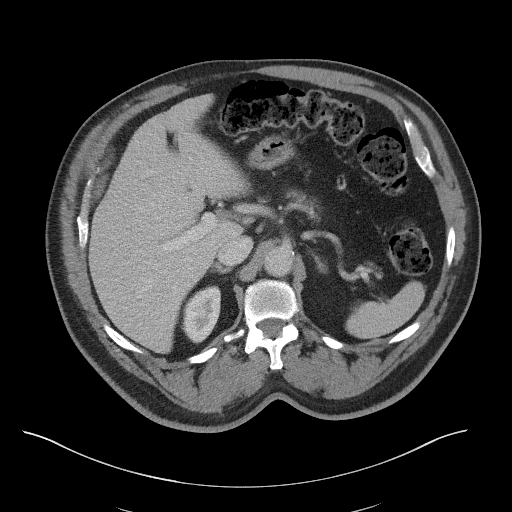
[im 81/91  soft-tissue]
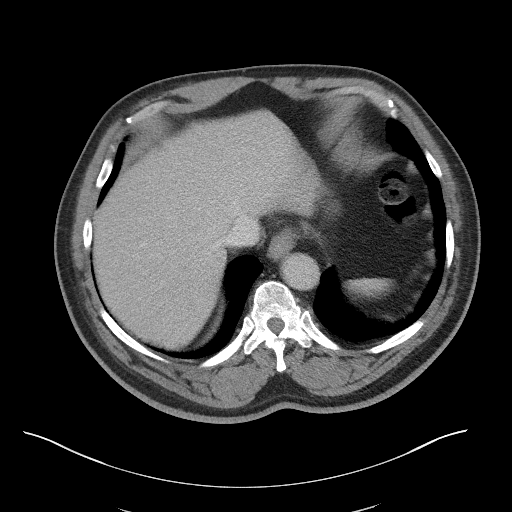
[im 86/91  soft-tissue]
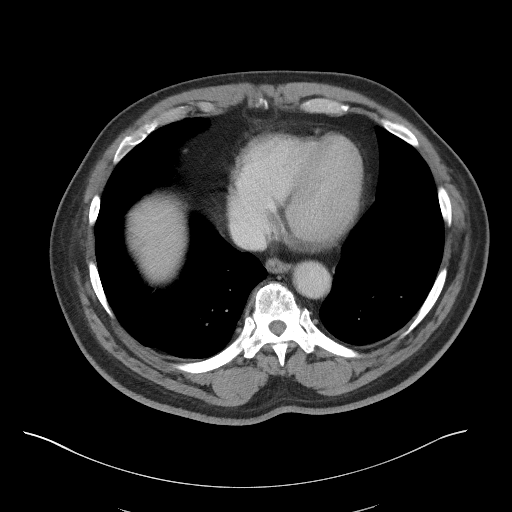

[Series 5: coronal st · coronal · 0.92mm/px · 3 of 113 slices shown]
[im 38/113  soft-tissue]
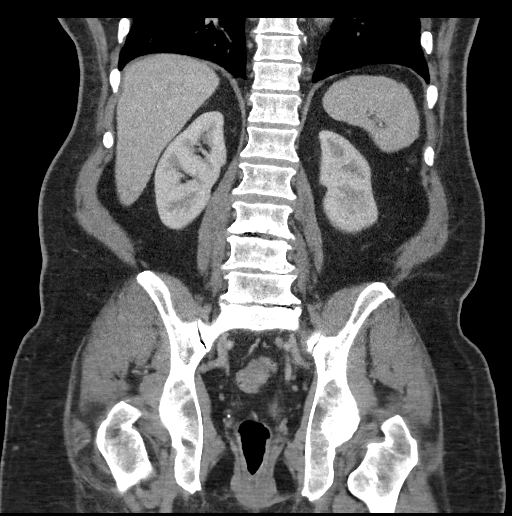
[im 50/113  soft-tissue]
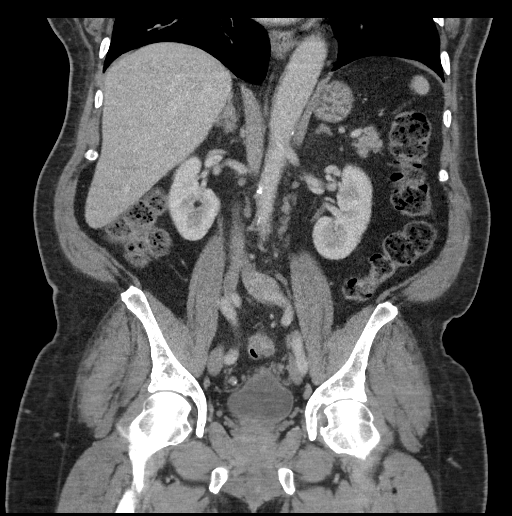
[im 63/113  soft-tissue]
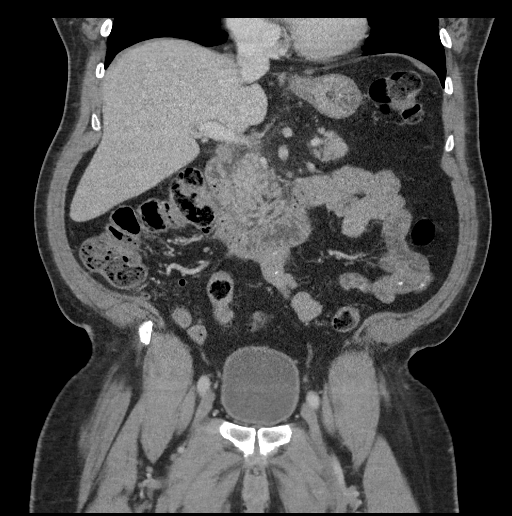

[16 of 46 positions shown; findings below may reference images not displayed]

FINDINGS: Lower chest: Visualized lung bases are clear. The visualized heart
and pericardium are unremarkable.

Hepatobiliary: No focal liver abnormality is seen. No gallstones,
gallbladder wall thickening, or biliary dilatation.

Pancreas: There is mild peripancreatic inflammatory stranding and
surrounding the head and uncinate process of the pancreas with
heterogeneous enhancement of this region possibly related to
interstitial edema or a patchy parenchymal necrosis, all in keeping
with changes of moderate, acute pancreatitis. The pancreatic duct is
not dilated. No pancreatic parenchymal calcifications. No
peripancreatic fluid collections are identified. Mild peripancreatic
shotty adenopathy is likely reactive in nature.

Spleen: Normal in size without focal abnormality.

Adrenals/Urinary Tract: The adrenal glands are unremarkable. Simple
cortical cyst noted within the interpolar region and lower pole of
the right kidney. The kidneys are otherwise unremarkable. The
bladder is unremarkable.

Stomach/Bowel: Moderate stool throughout the colon. The stomach,
small bowel, and large bowel are otherwise unremarkable. Appendix
normal. No free intraperitoneal gas or fluid.

Vascular/Lymphatic: Mild aortoiliac atherosclerotic calcification.
No aortic aneurysm. No pathologic adenopathy within the abdomen and
pelvis.

Reproductive: Prostate is unremarkable.

Other: Tiny fat containing umbilical hernia.  Rectum unremarkable.

Musculoskeletal: Degenerative changes are seen within the lumbar
spine. No acute bone abnormality. No lytic or blastic bone lesion.
IMPRESSION: Acute, moderate pancreatitis involving the head and uncinate process
of the pancreas with heterogeneous enhancement of this region likely
reflecting interstitial edema or patchy parenchymal necrosis.

## 2022-02-26 ENCOUNTER — Telehealth: Payer: Self-pay | Admitting: Family Medicine

## 2022-02-26 DIAGNOSIS — I10 Essential (primary) hypertension: Secondary | ICD-10-CM

## 2022-02-26 MED ORDER — AMLODIPINE BESYLATE 10 MG PO TABS: 10.0000 mg | ORAL_TABLET | Freq: Every day | ORAL | 0 refills | Status: DC

## 2022-02-26 NOTE — Telephone Encounter (Signed)
Requested Rx refilled, called patient to schedule follow up appointment no answer LMTCB to schedule  ?

## 2022-02-26 NOTE — Telephone Encounter (Signed)
Caller Name: Jadrien Blyth ?Call back phone #: [phone] ? ?MEDICATION(S): Blood pressure med  ? ? ?Days of Med Remaining: 2 pills ? ?Has the patient contacted their pharmacy (YES/NO)?  yes ?IF YES, when and what did the pharmacy advise? To contact pcp for refill  ?IF NO, request that the patient contact the pharmacy for the refills in the future.  ?           The pharmacy will send an electronic request (except for controlled medications). ? ?~~~Please advise patient/caregiver to allow 2-3 business days to process RX refills.  ?

## 2022-03-20 ENCOUNTER — Telehealth: Payer: Self-pay | Admitting: Family Medicine

## 2022-03-20 NOTE — Telephone Encounter (Signed)
Left message for patient to call back and schedule Medicare Annual Wellness Visit (AWV) in office.  ° °If not able to come in office, please offer to do virtually or by telephone.  Left office number and my jabber #[phone]. ° °Due for AWVI ° °Please schedule at anytime with Nurse Health Advisor. °  °

## 2022-05-11 ENCOUNTER — Ambulatory Visit (INDEPENDENT_AMBULATORY_CARE_PROVIDER_SITE_OTHER): Payer: Medicare HMO | Admitting: Family Medicine

## 2022-05-11 ENCOUNTER — Encounter: Payer: Self-pay | Admitting: Family Medicine

## 2022-05-11 VITALS — BP 126/78 | HR 65 | Temp 97.6°F | Ht 72.0 in | Wt 245.8 lb

## 2022-05-11 DIAGNOSIS — I10 Essential (primary) hypertension: Secondary | ICD-10-CM

## 2022-05-11 NOTE — Progress Notes (Signed)
Established Patient Office Visit  Subjective   Patient ID: Benjamin Romero, male    DOB: [birth year]  Age: 71 y.o. MRN: [id number]  Chief Complaint  Patient presents with   Hypertension     Concerns about elevated BP per patient medications are not helping.     Hypertension Pertinent negatives include no blurred vision or headaches.  follow-up of blood pressure.  He has been running in the 150s over upper 80s at home over the last few weeks.  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  He is the Therapist, nutritional of a trucking company.  He is caring for his 64 year old mother.  He quit smoking 4 weeks ago.  Rarely drinks alcohol.  He does not use illicit drugs.  He exercises on a daily basis by walking and doing calisthenics.  He consumes a healthy diet.    Review of Systems  Constitutional: Negative.   HENT: Negative.    Eyes:  Negative for blurred vision, double vision, discharge and redness.  Respiratory: Negative.    Cardiovascular: Negative.   Gastrointestinal:  Negative for abdominal pain.  Genitourinary: Negative.   Musculoskeletal: Negative.  Negative for myalgias.  Skin:  Negative for rash.  Neurological:  Negative for dizziness, tingling, loss of consciousness, weakness and headaches.  Endo/Heme/Allergies:  Negative for polydipsia.     Objective:     BP 126/78 (BP Location: Left Arm, Patient Position: Sitting, Cuff Size: Large)   Pulse 65   Temp 97.6 F (36.4 C) (Temporal)   Ht 6' (1.829 m)   Wt 245 lb 12.8 oz (111.5 kg)   SpO2 96%   BMI 33.34 kg/m    Physical Exam Constitutional:      General: He is not in acute distress.    Appearance: Normal appearance. He is not ill-appearing, toxic-appearing or diaphoretic.  HENT:     Head: Normocephalic and atraumatic.     Right Ear: External ear normal.     Left Ear: External ear normal.     Mouth/Throat:     Mouth: Mucous membranes are moist.     Pharynx: Oropharynx is clear. No oropharyngeal exudate or  posterior oropharyngeal erythema.  Eyes:     General: No scleral icterus.       Right eye: No discharge.        Left eye: No discharge.     Extraocular Movements: Extraocular movements intact.     Conjunctiva/sclera: Conjunctivae normal.     Pupils: Pupils are equal, round, and reactive to light.  Cardiovascular:     Rate and Rhythm: Normal rate and regular rhythm.  Pulmonary:     Effort: Pulmonary effort is normal. No respiratory distress.     Breath sounds: Normal breath sounds.  Abdominal:     General: Bowel sounds are normal.  Musculoskeletal:     Cervical back: No rigidity or tenderness.  Skin:    General: Skin is warm and dry.  Neurological:     Mental Status: He is alert and oriented to person, place, and time.  Psychiatric:        Mood and Affect: Mood normal.        Behavior: Behavior normal.     No results found for any visits on 05/11/22.    The ASCVD Risk score (Arnett DK, et al., 2019) failed to calculate for the following reasons:   Cannot find a previous HDL lab   Cannot find a previous total cholesterol lab  Assessment & Plan:   Problem List Items Addressed This Visit       Cardiovascular and Mediastinum   Essential hypertension - Primary    Return in about 4 weeks (around 06/08/2022).  We will continue smoking cessation.  Information was given on managing hypertension.  Continue to avoid sodium.  We will check his blood pressure periodically.  Return in 4 weeks fasting with his blood pressure cuff for physical and recheck.  Mliss Sax, MD

## 2022-05-27 ENCOUNTER — Telehealth: Payer: Self-pay

## 2022-05-27 DIAGNOSIS — I10 Essential (primary) hypertension: Secondary | ICD-10-CM

## 2022-05-27 MED ORDER — AMLODIPINE BESYLATE 10 MG PO TABS: 10.0000 mg | ORAL_TABLET | Freq: Every day | ORAL | 0 refills | Status: DC

## 2022-05-27 NOTE — Telephone Encounter (Signed)
Last ov 5/55/23 Pt has 3 pills of amlodipine left. 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  Asking for a refill to be sent to [facility] on [facility]  Thank you

## 2022-06-22 ENCOUNTER — Telehealth: Payer: Self-pay

## 2022-06-22 NOTE — Telephone Encounter (Signed)
Left message for patient to call back and schedule Medicare Annual Wellness Visit (AWV) in office or virtual.    If not able to come in office, please offer to do virtually or by telephone.  Left office number.  Due for AWV   Please schedule at anytime with Nurse Health Advisor.   Dm/cma

## 2022-08-17 ENCOUNTER — Telehealth: Payer: Self-pay

## 2022-08-17 NOTE — Telephone Encounter (Signed)
Left message for patient to call back and schedule Medicare Annual Wellness Visit (AWV) in office or virtual.    If not able to come in office, please offer to do virtually or by telephone.  Left office number.  Due for AWV   Please schedule at anytime with Nurse Health Advisor.   Dm/cma

## 2022-08-27 ENCOUNTER — Telehealth: Payer: Self-pay | Admitting: Family Medicine

## 2022-08-27 DIAGNOSIS — I10 Essential (primary) hypertension: Secondary | ICD-10-CM

## 2022-08-27 MED ORDER — AMLODIPINE BESYLATE 10 MG PO TABS: 10.0000 mg | ORAL_TABLET | Freq: Every day | ORAL | 0 refills | Status: DC

## 2022-08-27 NOTE — Telephone Encounter (Signed)
Caller Name: pt   Call back phone #:  [phone]  MEDICATion  amLODipine (NORVASC) 10 MG tablet [[id number]]  Days of Med Remaining:   he took his last one today, he has misplaced his bottle.     Preferred Pharmacy:  [facility] [address] [place] - [address]  [address] [place] [address]  Phone:  [phone]  Fax:  [phone]   .

## 2022-12-09 ENCOUNTER — Telehealth: Payer: Self-pay | Admitting: Family Medicine

## 2022-12-09 DIAGNOSIS — I10 Essential (primary) hypertension: Secondary | ICD-10-CM

## 2022-12-09 MED ORDER — AMLODIPINE BESYLATE 10 MG PO TABS: 10.0000 mg | ORAL_TABLET | Freq: Every day | ORAL | 0 refills | Status: DC

## 2022-12-09 NOTE — Telephone Encounter (Signed)
Caller Name: pt Call back phone #: [phone]    MEDICATION(S):  amLODipine (NORVASC) 10 MG tablet [[id number]]   Days of Med Remaining:   Has the patient contacted their pharmacy (YES/NO)? no What did pharmacy advise?   Preferred Pharmacy:  [facility] [address] [place] - [address] [place] [address] Phone: [phone]  Fax: [phone]   ~~~Please advise patient/caregiver to allow 2-3 business days to process RX refills.    Pt said call him . His med is low and he going out of town

## 2022-12-10 ENCOUNTER — Telehealth: Payer: Self-pay | Admitting: Family Medicine

## 2022-12-10 NOTE — Telephone Encounter (Signed)
Please call the pt about his medicine. Pt called several different times and no one got back to him

## 2022-12-10 NOTE — Telephone Encounter (Signed)
Returned patients call per patient he feels his BP medication should be increased it seems to be controlled better when he takes two BP pills instead of 1. Appointment scheduled for follow up

## 2022-12-24 ENCOUNTER — Ambulatory Visit: Payer: Medicare HMO | Admitting: Family Medicine

## 2023-01-04 ENCOUNTER — Ambulatory Visit
Admission: EM | Admit: 2023-01-04 | Discharge: 2023-01-04 | Disposition: A | Discharge status: Home / Self Care | Payer: Medicare HMO | Attending: Physician Assistant | Admitting: Physician Assistant

## 2023-01-04 ENCOUNTER — Telehealth: Payer: Self-pay | Admitting: Family Medicine

## 2023-01-04 DIAGNOSIS — I10 Essential (primary) hypertension: Secondary | ICD-10-CM

## 2023-01-04 DIAGNOSIS — H8110 Benign paroxysmal vertigo, unspecified ear: Secondary | ICD-10-CM | POA: Diagnosis not present

## 2023-01-04 MED ORDER — AMLODIPINE BESYLATE 10 MG PO TABS: 10.0000 mg | ORAL_TABLET | Freq: Every day | ORAL | 0 refills | Status: DC

## 2023-01-04 MED ORDER — MECLIZINE HCL 12.5 MG PO TABS: 12.5000 mg | ORAL_TABLET | Freq: Three times a day (TID) | ORAL | 0 refills | Status: DC | PRN
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
## 2023-01-04 NOTE — Telephone Encounter (Signed)
Spoke with patient who states that he was seen at urgent care seems to be feeling better received Rx for Vertigo. No concerns as of now.
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
## 2023-01-04 NOTE — Telephone Encounter (Signed)
Requested refill sent to pharmacy, called patient to check on him. No answer LMTCB
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
## 2023-01-04 NOTE — Discharge Instructions (Signed)
Advised to be careful when getting up from a sitting position, turning, or bending over.  Make sure you have balance normalizes when making positional changes before starting to walk.  Advised to take the meclizine every 6 hours on a regular basis to help decrease the dizziness and off-balance sensation.  Advised to follow-up PCP or return to urgent care as needed.

## 2023-01-13 ENCOUNTER — Telehealth: Payer: Self-pay | Admitting: Family Medicine

## 2023-01-13 NOTE — Telephone Encounter (Signed)
Left message for patient to call back and schedule Medicare Annual Wellness Visit (AWV) either virtually or in office. Left  my Benjamin Romero number [phone]   -DUE AWVI 12/21/2009 PER PAL[place] please schedule with Nurse Health Adviser   45 min for awv-i  in office appointments 30 min for awv-s & awv-i phone/virtual appointments

## 2023-02-18 ENCOUNTER — Telehealth: Payer: Self-pay | Admitting: Family Medicine

## 2023-02-18 NOTE — Telephone Encounter (Signed)
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 Is this okay until patient is able to come in? Also informed that he will need to come in for evaluation and follow upon BP. 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

## 2023-02-18 NOTE — Telephone Encounter (Signed)
Called patient to schedule Medicare Annual Wellness Visit (AWV). Left message for patient to call back and schedule Medicare Annual Wellness Visit (AWV).  Last date of AWV:  DUE AWVI 12/21/2009 PER PALMETTO  Please schedule an appointment at any time with [facility] [place] .  If any questions, please contact me at [phone].  Thank you ,  Barkley Boards AWV direct phone # [phone]  Left several message  lm 01/13/23   LM 04/13/22,03/20/22,12/23/21-10/28/21,08/18/21

## 2023-02-18 NOTE — Telephone Encounter (Signed)
Contacted Benjamin Romero to schedule their annual wellness visit. Appointment made for 02/22/23.  Benjamin Romero AWV direct phone # [phone]

## 2023-02-18 NOTE — Telephone Encounter (Signed)
Benjamin Romero [phone]  Pt stated that his BP med amlodipine at 10 mg does not bring his pressure down enough for him to work.  He has been taking 2 10 mg tablets every morning and it brings it down to 128. He would like to know if this is ok and can there be an adjustment made, or does he need an appt?

## 2023-02-19 NOTE — Telephone Encounter (Signed)
Called patient to inform that the max dose of Amlodipine is '10mg'$  so he should not be doubling his dose. No answer LMTCB

## 2023-02-22 ENCOUNTER — Ambulatory Visit (INDEPENDENT_AMBULATORY_CARE_PROVIDER_SITE_OTHER): Payer: Medicare HMO

## 2023-02-22 VITALS — Ht 72.0 in | Wt 245.0 lb

## 2023-02-22 DIAGNOSIS — Z0000 Encounter for general adult medical examination without abnormal findings: Secondary | ICD-10-CM | POA: Diagnosis not present

## 2023-02-22 NOTE — Patient Instructions (Signed)
Benjamin Romero , Thank you for taking time to come for your Medicare Wellness Visit. I appreciate your ongoing commitment to your health goals. Please review the following plan we discussed and let me know if I can assist you in the future.   These are the goals we discussed:  Goals      Patient Stated     02/22/2023, wants to get to 220 pounds        This is a list of the screening recommended for you and due dates:  Health Maintenance  Topic Date Due   Hepatitis C Screening: USPSTF Recommendation to screen - Ages 56-79 yo.  Never done   DTaP/Tdap/Td vaccine (1 - Tdap) Never done   Screening for Lung Cancer  Never done   Colon Cancer Screening  09/21/2023   Medicare Annual Wellness Visit  02/22/2024   Pneumonia Vaccine  Completed   Flu Shot  Completed   Zoster (Shingles) Vaccine  Completed   HPV Vaccine  Aged Out   COVID-19 Vaccine  Discontinued    Advanced directives: Advance directive discussed with you today.   Conditions/risks identified: none  Next appointment: Follow up in one year for your annual wellness visit.   Preventive Care 55 Years and Older, Male  Preventive care refers to lifestyle choices and visits with your health care provider that can promote health and wellness. What does preventive care include? A yearly physical exam. This is also called an annual well check. Dental exams once or twice a year. Routine eye exams. Ask your health care provider how often you should have your eyes checked. Personal lifestyle choices, including: Daily care of your teeth and gums. Regular physical activity. Eating a healthy diet. Avoiding tobacco and drug use. Limiting alcohol use. Practicing safe sex. Taking low doses of aspirin every day. Taking vitamin and mineral supplements as recommended by your health care provider. What happens during an annual well check? The services and screenings done by your health care provider during your annual well check will depend on  your age, overall health, lifestyle risk factors, and family history of disease. Counseling  Your health care provider may ask you questions about your: Alcohol use. Tobacco use. Drug use. Emotional well-being. Home and relationship well-being. Sexual activity. Eating habits. History of falls. Memory and ability to understand (cognition). Work and work Statistician. Screening  You may have the following tests or measurements: Height, weight, and BMI. Blood pressure. Lipid and cholesterol levels. These may be checked every 5 years, or more frequently if you are over 73 years old. Skin check. Lung cancer screening. You may have this screening every year starting at age 66 if you have a 30-pack-year history of smoking and currently smoke or have quit within the past 15 years. Fecal occult blood test (FOBT) of the stool. You may have this test every year starting at age 70. Flexible sigmoidoscopy or colonoscopy. You may have a sigmoidoscopy every 5 years or a colonoscopy every 10 years starting at age 38. Prostate cancer screening. Recommendations will vary depending on your family history and other risks. Hepatitis C blood test. Hepatitis B blood test. Sexually transmitted disease (STD) testing. Diabetes screening. This is done by checking your blood sugar (glucose) after you have not eaten for a while (fasting). You may have this done every 1-3 years. Abdominal aortic aneurysm (AAA) screening. You may need this if you are a current or former smoker. Osteoporosis. You may be screened starting at age 40 if you  are at high risk. Talk with your health care provider about your test results, treatment options, and if necessary, the need for more tests. Vaccines  Your health care provider may recommend certain vaccines, such as: Influenza vaccine. This is recommended every year. Tetanus, diphtheria, and acellular pertussis (Tdap, Td) vaccine. You may need a Td booster every 10 years. Zoster  vaccine. You may need this after age 86. Pneumococcal 13-valent conjugate (PCV13) vaccine. One dose is recommended after age 47. Pneumococcal polysaccharide (PPSV23) vaccine. One dose is recommended after age 50. Talk to your health care provider about which screenings and vaccines you need and how often you need them. This information is not intended to replace advice given to you by your health care provider. Make sure you discuss any questions you have with your health care provider. Document Released: 01/03/2016 Document Revised: 08/26/2016 Document Reviewed: 10/08/2015 Elsevier Interactive Patient Education  2017 [place] Prevention in the Home Falls can cause injuries. They can happen to people of all ages. There are many things you can do to make your home safe and to help prevent falls. What can I do on the outside of my home? Regularly fix the edges of walkways and driveways and fix any cracks. Remove anything that might make you trip as you walk through a door, such as a raised step or threshold. Trim any bushes or trees on the path to your home. Use bright outdoor lighting. Clear any walking paths of anything that might make someone trip, such as rocks or tools. Regularly check to see if handrails are loose or broken. Make sure that both sides of any steps have handrails. Any raised decks and porches should have guardrails on the edges. Have any leaves, snow, or ice cleared regularly. Use sand or salt on walking paths during winter. Clean up any spills in your garage right away. This includes oil or grease spills. What can I do in the bathroom? Use night lights. Install grab bars by the toilet and in the tub and shower. Do not use towel bars as grab bars. Use non-skid mats or decals in the tub or shower. If you need to sit down in the shower, use a plastic, non-slip stool. Keep the floor dry. Clean up any water that spills on the floor as soon as it happens. Remove  soap buildup in the tub or shower regularly. Attach bath mats securely with double-sided non-slip rug tape. Do not have throw rugs and other things on the floor that can make you trip. What can I do in the bedroom? Use night lights. Make sure that you have a light by your bed that is easy to reach. Do not use any sheets or blankets that are too big for your bed. They should not hang down onto the floor. Have a firm chair that has side arms. You can use this for support while you get dressed. Do not have throw rugs and other things on the floor that can make you trip. What can I do in the kitchen? Clean up any spills right away. Avoid walking on wet floors. Keep items that you use a lot in easy-to-reach places. If you need to reach something above you, use a strong step stool that has a grab bar. Keep electrical cords out of the way. Do not use floor polish or wax that makes floors slippery. If you must use wax, use non-skid floor wax. Do not have throw rugs and other things on the  floor that can make you trip. What can I do with my stairs? Do not leave any items on the stairs. Make sure that there are handrails on both sides of the stairs and use them. Fix handrails that are broken or loose. Make sure that handrails are as long as the stairways. Check any carpeting to make sure that it is firmly attached to the stairs. Fix any carpet that is loose or worn. Avoid having throw rugs at the top or bottom of the stairs. If you do have throw rugs, attach them to the floor with carpet tape. Make sure that you have a light switch at the top of the stairs and the bottom of the stairs. If you do not have them, ask someone to add them for you. What else can I do to help prevent falls? Wear shoes that: Do not have high heels. Have rubber bottoms. Are comfortable and fit you well. Are closed at the toe. Do not wear sandals. If you use a stepladder: Make sure that it is fully opened. Do not climb a  closed stepladder. Make sure that both sides of the stepladder are locked into place. Ask someone to hold it for you, if possible. Clearly mark and make sure that you can see: Any grab bars or handrails. First and last steps. Where the edge of each step is. Use tools that help you move around (mobility aids) if they are needed. These include: Canes. Walkers. Scooters. Crutches. Turn on the lights when you go into a dark area. Replace any light bulbs as soon as they burn out. Set up your furniture so you have a clear path. Avoid moving your furniture around. If any of your floors are uneven, fix them. If there are any pets around you, be aware of where they are. Review your medicines with your doctor. Some medicines can make you feel dizzy. This can increase your chance of falling. Ask your doctor what other things that you can do to help prevent falls. This information is not intended to replace advice given to you by your health care provider. Make sure you discuss any questions you have with your health care provider. Document Released: 10/03/2009 Document Revised: 05/14/2016 Document Reviewed: 01/11/2015 Elsevier Interactive Patient Education  2017 [employer].

## 2023-02-22 NOTE — Progress Notes (Signed)
I connected with  Benjamin Romero on 02/22/23 by a audio enabled telemedicine application and verified that I am speaking with the correct person using two identifiers.  Patient Location: Home  Provider Location: Office/Clinic  I discussed the limitations of evaluation and management by telemedicine. The patient expressed understanding and agreed to proceed.  Subjective:   Benjamin Romero is a 72 y.o. male who presents for an Initial Medicare Annual Wellness Visit.  Review of Systems     Cardiac Risk Factors include: advanced age (>27mn, >>18women);hypertension;obesity (BMI >30kg/m2)     Objective:    Today's Vitals   02/22/23 0842 02/22/23 0843  Weight: 245 lb (111.1 kg)   Height: 6' (1.829 m)   PainSc:  6    Body mass index is 33.23 kg/m.     02/22/2023    8:49 AM 01/19/2022    4:19 PM 07/01/2021    1:53 PM 02/23/2021   10:56 PM 12/15/2019    7:35 PM 05/29/2019    8:43 AM 05/15/2019   10:08 PM  Advanced Directives  Does Patient Have a Medical Advance Directive? No No No No No No No  Would patient like information on creating a medical advance directive?   No - Patient declined No - Patient declined  No - Patient declined     Current Medications (verified) Outpatient Encounter Medications as of 02/22/2023  Medication Sig   amLODipine (NORVASC) 10 MG tablet Take 1 tablet (10 mg total) by mouth daily.   ascorbic acid (VITAMIN C) 1000 MG tablet Take by mouth.   Cholecalciferol (VITAMIN D3) 25 MCG (1000 UT) capsule Take 1,000 Units by mouth daily.   KRILL OIL PO Take by mouth.   meclizine (ANTIVERT) 12.5 MG tablet Take 1 tablet (12.5 mg total) by mouth 3 (three) times daily as needed for dizziness.   Omega-3 Fatty Acids (FISH OIL) 1000 MG CAPS Take by mouth.   Thiamine HCl (VITAMIN B-1 PO) Take 1 tablet by mouth daily.   No facility-administered encounter medications on file as of 02/22/2023.    Allergies (verified) Patient has no known allergies.   History: Past Medical  History:  Diagnosis Date   Colon polyps    Diverticulosis    E-coli UTI 11-02-12   1 month ago- due to diet reducing med used-all clear   Gas    Pancreatitis    Sleep apnea    denies-no cpap used,dx. 2009   Past Surgical History:  Procedure Laterality Date   BACK SURGERY     lumbar   COLONOSCOPY     EUS  12/31/2011   Procedure: UPPER ENDOSCOPIC ULTRASOUND (EUS) LINEAR;  Surgeon: DOwens Loffler MD;  Location: [facility];  Service: Endoscopy;  Laterality: N/A;  radial linear   HAND SURGERY Left    repair traum cut left hand   KNEE SURGERY  bil   TOTAL KNEE ARTHROPLASTY  11/03/2012   Procedure: TOTAL KNEE ARTHROPLASTY;  Surgeon: CMcarthur Rossetti MD;  Location: [facility];  Service: Orthopedics;  Laterality: Left;  Left Total Knee Arthroplasty   TOTAL KNEE REVISION Left 01/05/2014   Procedure: LEFT TOTAL KNEE POLY EXCHANGE;  Surgeon: CMcarthur Rossetti MD;  Location: [facility];  Service: Orthopedics;  Laterality: Left;   Family History  Problem Relation Age of Onset   Thyroid cancer Mother    Social History   Socioeconomic History   Marital status: Married    Spouse name: Not on file   Number of children: 2  Years of education: Not on file   Highest education level: Not on file  Occupational History   Not on file  Tobacco Use   Smoking status: Former    Packs/day: 2.00    Types: Cigarettes    Quit date: 12/21/2022    Years since quitting: 0.1   Smokeless tobacco: Never  Vaping Use   Vaping Use: Never used  Substance and Sexual Activity   Alcohol use: Yes    Comment: pt states "1 beer 4 months ago"   Drug use: No   Sexual activity: Yes  Other Topics Concern   Not on file  Social History Narrative   Not on file   Social Determinants of Health   Financial Resource Strain: Low Risk  (02/22/2023)   Overall Financial Resource Strain (CARDIA)    Difficulty of Paying Living Expenses: Not hard at all  Food Insecurity: No Food Insecurity (02/22/2023)   Hunger Vital  Sign    Worried About Running Out of Food in the Last Year: Never true    Ran Out of Food in the Last Year: Never true  Transportation Needs: No Transportation Needs (02/22/2023)   PRAPARE - Hydrologist (Medical): No    Lack of Transportation (Non-Medical): No  Physical Activity: Insufficiently Active (02/22/2023)   Exercise Vital Sign    Days of Exercise per Week: 7 days    Minutes of Exercise per Session: 20 min  Stress: No Stress Concern Present (02/22/2023)   [place]    Feeling of Stress : Not at all  Social Connections: Not on file    Tobacco Counseling Counseling given: Not Answered   Clinical Intake:  Pre-visit preparation completed: Yes  Pain : 0-10 Pain Score: 6  Pain Type: Chronic pain Pain Location: Knee Pain Orientation: Left, Right Pain Descriptors / Indicators: Aching Pain Onset: More than a month ago Pain Frequency: Intermittent     Nutritional Status: BMI > 30  Obese Nutritional Risks: None Diabetes: No  How often do you need to have someone help you when you read instructions, pamphlets, or other written materials from your doctor or pharmacy?: 1 - Never  Diabetic? no  Interpreter Needed?: No  Information entered by :: NAllen LPN   Activities of Daily Living    02/22/2023    8:51 AM  In your present state of health, do you have any difficulty performing the following activities:  Hearing? 0  Vision? 0  Difficulty concentrating or making decisions? 0  Walking or climbing stairs? 0  Dressing or bathing? 0  Doing errands, shopping? 0  Preparing Food and eating ? N  Using the Toilet? N  In the past six months, have you accidently leaked urine? N  Do you have problems with loss of bowel control? N  Managing your Medications? N  Managing your Finances? N  Housekeeping or managing your Housekeeping? N    Patient Care Team: Libby Maw, MD  as PCP - General (Family Medicine) Rolan Bucco, MD as Consulting Physician (Urology)  Indicate any recent Medical Services you may have received from other than [facility] providers in the past year (date may be approximate).     Assessment:   This is a routine wellness examination for Benjamin Romero.  Hearing/Vision screen Vision [facility] - Comments:: Regular eye exams, [facility]  Dietary issues and exercise activities discussed: Current Exercise Habits: Home exercise routine;The patient has a physically strenuous job, but  has no regular exercise apart from work., Type of exercise: walking, Time (Minutes): 15, Frequency (Times/Week): 7, Weekly Exercise (Minutes/Week): 105   Goals Addressed             This Visit's Progress    Patient Stated       02/22/2023, wants to get to 220 pounds       Depression Screen    02/22/2023    8:50 AM 05/11/2022    2:20 PM 07/07/2021   11:49 AM 06/07/2014    8:56 AM  PHQ 2/9 Scores  PHQ - 2 Score 0 0 1 0    Fall Risk    02/22/2023    8:50 AM 05/11/2022    2:20 PM 07/07/2021   11:49 AM 06/07/2014    8:56 AM  Fall Risk   Falls in the past year? 0 0 0 Yes  Number falls in past yr: 0 0  2 or more  Injury with Fall? 0     Risk Factor Category     High Fall Risk  Risk for fall due to : Medication side effect   History of fall(s)  Follow up Falls prevention discussed;Education provided;Falls evaluation completed       FALL RISK PREVENTION PERTAINING TO THE HOME:  Any stairs in or around the home? No  If so, are there any without handrails? N/a Home free of loose throw rugs in walkways, pet beds, electrical cords, etc? Yes  Adequate lighting in your home to reduce risk of falls? Yes   ASSISTIVE DEVICES UTILIZED TO PREVENT FALLS:  Life alert? No  Use of a cane, walker or w/c? No  Grab bars in the bathroom? Yes  Shower chair or bench in shower? Yes  Elevated toilet seat or a handicapped toilet? Yes   TIMED UP AND GO:  Was the test  performed? No .      Cognitive Function:        02/22/2023    8:51 AM  6CIT Screen  What Year? 0 points  What month? 0 points  What time? 0 points  Count back from 20 0 points  Months in reverse 0 points  Repeat phrase 4 points  Total Score 4 points    Immunizations Immunization History  Administered Date(s) Administered   Fluad Quad(high Dose 65+) 11/04/2022   Influenza,inj,Quad PF,6+ Mos 01/06/2014   Influenza-Unspecified 02/14/2017   PFIZER(Purple Top)SARS-COV-2 Vaccination 04/04/2020, 04/29/2020, 10/28/2020   PNEUMOCOCCAL CONJUGATE-20 03/04/2022   Pneumococcal Conjugate-13 02/14/2017   Pneumococcal Polysaccharide-23 01/06/2014   Zoster Recombinat (Shingrix) 11/04/2021, 03/04/2022    TDAP status: Due, Education has been provided regarding the importance of this vaccine. Advised may receive this vaccine at local pharmacy or Health Dept. Aware to provide a copy of the vaccination record if obtained from local pharmacy or Health Dept. Verbalized acceptance and understanding.  Flu Vaccine status: Up to date  Pneumococcal vaccine status: Up to date  Covid-19 vaccine status: Completed vaccines  Qualifies for Shingles Vaccine? Yes   Zostavax completed Yes   Shingrix Completed?: Yes  Screening Tests Health Maintenance  Topic Date Due   Medicare Annual Wellness (AWV)  Never done   Hepatitis C Screening  Never done   DTaP/Tdap/Td (1 - Tdap) Never done   Lung Cancer Screening  Never done   COLONOSCOPY (Pts 45-77yr Insurance coverage will need to be confirmed)  09/21/2023   Pneumonia Vaccine 72 Years old  Completed   INFLUENZA VACCINE  Completed   Zoster Vaccines- Shingrix  Completed   HPV VACCINES  Aged Out   COVID-19 Vaccine  Discontinued    Health Maintenance  Health Maintenance Due  Topic Date Due   Medicare Annual Wellness (AWV)  Never done   Hepatitis C Screening  Never done   DTaP/Tdap/Td (1 - Tdap) Never done   Lung Cancer Screening  Never done     Colorectal cancer screening: Type of screening: Colonoscopy. Completed 09/20/2013. Repeat every 10 years  Lung Cancer Screening: (Low Dose CT Chest recommended if Age 85-80 years, 30 pack-year currently smoking OR have quit w/in 15years.) does not qualify.   Lung Cancer Screening Referral: no  Additional Screening:  Hepatitis C Screening: does qualify;   Vision Screening: Recommended annual ophthalmology exams for early detection of glaucoma and other disorders of the eye. Is the patient up to date with their annual eye exam?  Yes  Who is the provider or what is the name of the office in which the patient attends annual eye exams? [facility] If pt is not established with a provider, would they like to be referred to a provider to establish care? No .   Dental Screening: Recommended annual dental exams for proper oral hygiene  Community Resource Referral / Chronic Care Management: CRR required this visit?  No   CCM required this visit?  No      Plan:     I have personally reviewed and noted the following in the patient's chart:   Medical and social history Use of alcohol, tobacco or illicit drugs  Current medications and supplements including opioid prescriptions. Patient is not currently taking opioid prescriptions. Functional ability and status Nutritional status Physical activity Advanced directives List of other physicians Hospitalizations, surgeries, and ER visits in previous 12 months Vitals Screenings to include cognitive, depression, and falls Referrals and appointments  In addition, I have reviewed and discussed with patient certain preventive protocols, quality metrics, and best practice recommendations. A written personalized care plan for preventive services as well as general preventive health recommendations were provided to patient.     Kellie Simmering, LPN   [id number]   Nurse Notes: none  Due to this being a virtual visit, the after visit summary  with patients personalized plan was offered to patient via mail or my-chart.  to pick up at office at next visit

## 2023-02-23 NOTE — Telephone Encounter (Signed)
Called patient no answer LMTCB

## 2023-02-24 NOTE — Telephone Encounter (Signed)
Called patient to inform of message below, no answer LMTCB

## 2023-03-02 ENCOUNTER — Encounter: Payer: Self-pay | Admitting: Family Medicine

## 2023-03-02 ENCOUNTER — Ambulatory Visit (INDEPENDENT_AMBULATORY_CARE_PROVIDER_SITE_OTHER): Payer: Medicare HMO | Admitting: Family Medicine

## 2023-03-02 VITALS — BP 120/80 | HR 60 | Temp 98.3°F | Resp 16 | Ht 67.0 in | Wt 257.4 lb

## 2023-03-02 DIAGNOSIS — I10 Essential (primary) hypertension: Secondary | ICD-10-CM

## 2023-03-02 DIAGNOSIS — Z1211 Encounter for screening for malignant neoplasm of colon: Secondary | ICD-10-CM | POA: Diagnosis not present

## 2023-03-02 DIAGNOSIS — Z6841 Body Mass Index (BMI) 40.0 and over, adult: Secondary | ICD-10-CM | POA: Diagnosis not present

## 2023-03-02 MED ORDER — AMLODIPINE BESYLATE 10 MG PO TABS: 10.0000 mg | ORAL_TABLET | Freq: Every day | ORAL | 0 refills | Status: DC
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
## 2023-03-03 NOTE — Telephone Encounter (Signed)
Patient had OV on 03/02/23 BP addresses at this visit

## 2023-07-12 ENCOUNTER — Other Ambulatory Visit: Payer: Self-pay | Admitting: Family Medicine

## 2023-07-12 DIAGNOSIS — I10 Essential (primary) hypertension: Secondary | ICD-10-CM

## 2023-07-13 ENCOUNTER — Telehealth: Payer: Self-pay | Admitting: Family Medicine

## 2023-07-13 ENCOUNTER — Other Ambulatory Visit: Payer: Self-pay

## 2023-07-13 DIAGNOSIS — I10 Essential (primary) hypertension: Secondary | ICD-10-CM

## 2023-07-13 MED ORDER — AMLODIPINE BESYLATE 10 MG PO TABS: 10.0000 mg | ORAL_TABLET | Freq: Every day | ORAL | 0 refills | Status: DC

## 2023-07-13 NOTE — Telephone Encounter (Signed)
Pt want to know if he can get a refill on amLODipine  since he has schedule an appointment

## 2023-07-16 NOTE — Telephone Encounter (Signed)
error 

## 2023-07-26 ENCOUNTER — Ambulatory Visit: Payer: Medicare HMO | Admitting: Family Medicine

## 2023-07-26 ENCOUNTER — Encounter: Payer: Self-pay | Admitting: Family Medicine

## 2023-07-26 VITALS — BP 134/70 | Ht 67.0 in | Wt 254.0 lb

## 2023-07-26 DIAGNOSIS — F4321 Adjustment disorder with depressed mood: Secondary | ICD-10-CM

## 2023-07-26 DIAGNOSIS — I10 Essential (primary) hypertension: Secondary | ICD-10-CM | POA: Diagnosis not present

## 2023-07-26 DIAGNOSIS — T887XXA Unspecified adverse effect of drug or medicament, initial encounter: Secondary | ICD-10-CM | POA: Diagnosis not present

## 2023-07-26 MED ORDER — AMLODIPINE BESYLATE 5 MG PO TABS: 5.0000 mg | ORAL_TABLET | Freq: Every day | ORAL | 0 refills | Status: DC

## 2023-07-26 MED ORDER — HYDROCHLOROTHIAZIDE 25 MG PO TABS: 25.0000 mg | ORAL_TABLET | Freq: Every day | ORAL | 0 refills | Status: DC
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
## 2023-08-24 ENCOUNTER — Other Ambulatory Visit: Payer: Self-pay

## 2023-08-24 ENCOUNTER — Ambulatory Visit: Payer: Medicare HMO | Admitting: Orthopedic Surgery

## 2023-08-24 ENCOUNTER — Other Ambulatory Visit (INDEPENDENT_AMBULATORY_CARE_PROVIDER_SITE_OTHER): Payer: Medicare HMO

## 2023-08-24 ENCOUNTER — Encounter: Payer: Self-pay | Admitting: Orthopedic Surgery

## 2023-08-24 DIAGNOSIS — G8929 Other chronic pain: Secondary | ICD-10-CM

## 2023-08-24 DIAGNOSIS — M25562 Pain in left knee: Secondary | ICD-10-CM

## 2023-08-24 DIAGNOSIS — M79642 Pain in left hand: Secondary | ICD-10-CM | POA: Diagnosis not present
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
## 2023-08-25 ENCOUNTER — Other Ambulatory Visit: Payer: Self-pay | Admitting: Orthopedic Surgery

## 2023-08-25 ENCOUNTER — Telehealth: Payer: Self-pay | Admitting: Orthopedic Surgery

## 2023-08-25 LAB — URIC ACID: Uric Acid, Serum: 6.6 mg/dL (ref 4.0–8.0)

## 2023-08-25 MED ORDER — ALLOPURINOL 100 MG PO TABS: 100.0000 mg | ORAL_TABLET | Freq: Every day | ORAL | 11 refills | Status: DC

## 2023-08-25 NOTE — Telephone Encounter (Signed)
Rx sent to [facility]. Pt informed of results. And Med instructions.

## 2023-08-25 NOTE — Telephone Encounter (Signed)
From: Nadara Mustard, MD  Sent: 08/25/2023   7:05 AM EDT  To: Erlene Quan Forrest, RMA   Uric acid 6.6.  Lets start him on allopurinol 100 mg once a day.

## 2023-10-17 ENCOUNTER — Other Ambulatory Visit: Payer: Self-pay | Admitting: Family Medicine

## 2023-10-17 DIAGNOSIS — I10 Essential (primary) hypertension: Secondary | ICD-10-CM

## 2023-10-25 ENCOUNTER — Other Ambulatory Visit: Payer: Self-pay | Admitting: Family Medicine

## 2023-10-25 DIAGNOSIS — I10 Essential (primary) hypertension: Secondary | ICD-10-CM

## 2024-03-10 ENCOUNTER — Ambulatory Visit: Payer: Self-pay

## 2024-03-10 NOTE — Telephone Encounter (Signed)
  Chief Complaint: BUE and BLE swelling to joints.  Symptoms: joint pain, numbness BLE knee to heel, increased urination,  Frequency: about 2 weeks Pertinent Negatives: Patient denies SOB, CP, difficulty breathing, swelling to calves,  Disposition: [] ED /[] Urgent Care (no appt availability in office) / [x] Appointment(In office/virtual)/ []  [facility]/ [] Home Care/ [] Refused Recommended Disposition /[] [facility]/ []  Follow-up with PCP Additional Notes: Pt states that he is having swelling to his hands, knees, and ankles. Pt states that he has numbness BLE. Pt states that the numbness goes to the heel from the knee.  Pt states that he has increased urination, about every 2 hours. Denies other GU s/s. Pt denies SOB, difficulty breathing, redness to calves, swelling to calves. Pt states that only joints are swollen. Pt states this has been an ongoing issue for at least 2 weeks. Pt sched with pcp office, next business day. Pt advised if swelling develops in calves, SOB or CP to go to ED. Pt agreeable.  Copied from CRM [id number]. Topic: Clinical - Red Word Triage >> Mar 10, 2024  3:36 PM Benjamin Romero wrote: Red Word that prompted transfer to Nurse Triage: Right hand currently swollen, knees are swollen as well - numbness in both feet and calf. Reason for Disposition  [1] MILD swelling of both ankles (i.e., pedal edema) AND [2] new-onset or worsening  Answer Assessment - Initial Assessment Questions 1. ONSET: "When did the swelling start?" (e.g., minutes, hours, days)     About 2 weeks.  2. LOCATION: "What part of the leg is swollen?"  "Are both legs swollen or just one leg?"     Knees, hands, ankles and calves 3. SEVERITY: "How bad is the swelling?" (e.g., localized; mild, moderate, severe)   - Localized: Small area of swelling localized to one leg.   - MILD pedal edema: Swelling limited to foot and ankle, pitting edema < 1/4 inch (6 mm) deep, rest and elevation eliminate most or  all swelling.   - MODERATE edema: Swelling of lower leg to knee, pitting edema > 1/4 inch (6 mm) deep, rest and elevation only partially reduce swelling.   - SEVERE edema: Swelling extends above knee, facial or hand swelling present.      Severe per pt description, pt states moderate 4. REDNESS: "Does the swelling look red or infected?"     denies 5. PAIN: "Is the swelling painful to touch?" If Yes, ask: "How painful is it?"   (Scale 1-10; mild, moderate or severe)     Pain in both legs/knees, pain started about Romero month ago. 5 6. FEVER: "Do you have Romero fever?" If Yes, ask: "What is it, how was it measured, and when did it start?"      denies 7. CAUSE: "What do you think is causing the leg swelling?"     God only knows, I'm 68 8. MEDICAL HISTORY: "Do you have Romero history of blood clots (e.g., DVT), cancer, heart failure, kidney disease, or liver failure?"     HTN 9. RECURRENT SYMPTOM: "Have you had leg swelling before?" If Yes, ask: "When was the last time?" "What happened that time?"     One knee replacement, operated on it twice, both knees have had operation 10. OTHER SYMPTOMS: "Do you have any other symptoms?" (e.g., chest pain, difficulty breathing)       denies  Protocols used: Leg Swelling and Edema-Romero-AH

## 2024-03-13 ENCOUNTER — Encounter: Payer: Self-pay | Admitting: Family Medicine

## 2024-03-13 ENCOUNTER — Encounter: Admitting: Family Medicine

## 2024-03-13 ENCOUNTER — Ambulatory Visit (INDEPENDENT_AMBULATORY_CARE_PROVIDER_SITE_OTHER): Admitting: Family Medicine

## 2024-03-13 VITALS — BP 156/80 | HR 79 | Temp 97.9°F | Wt 245.4 lb

## 2024-03-13 DIAGNOSIS — Z1329 Encounter for screening for other suspected endocrine disorder: Secondary | ICD-10-CM

## 2024-03-13 DIAGNOSIS — Z1321 Encounter for screening for nutritional disorder: Secondary | ICD-10-CM | POA: Diagnosis not present

## 2024-03-13 DIAGNOSIS — M2550 Pain in unspecified joint: Secondary | ICD-10-CM

## 2024-03-13 DIAGNOSIS — I10 Essential (primary) hypertension: Secondary | ICD-10-CM

## 2024-03-13 DIAGNOSIS — N401 Enlarged prostate with lower urinary tract symptoms: Secondary | ICD-10-CM | POA: Diagnosis not present

## 2024-03-13 DIAGNOSIS — Z13228 Encounter for screening for other metabolic disorders: Secondary | ICD-10-CM | POA: Diagnosis not present

## 2024-03-13 DIAGNOSIS — Z130 Encounter for screening for diseases of the blood and blood-forming organs and certain disorders involving the immune mechanism: Secondary | ICD-10-CM | POA: Diagnosis not present

## 2024-03-13 DIAGNOSIS — R311 Benign essential microscopic hematuria: Secondary | ICD-10-CM | POA: Diagnosis not present

## 2024-03-13 DIAGNOSIS — R350 Frequency of micturition: Secondary | ICD-10-CM | POA: Diagnosis not present

## 2024-03-13 LAB — COMPREHENSIVE METABOLIC PANEL
ALT: 12 U/L (ref 0–53)
AST: 14 U/L (ref 0–37)
Albumin: 4.3 g/dL (ref 3.5–5.2)
Alkaline Phosphatase: 73 U/L (ref 39–117)
BUN: 13 mg/dL (ref 6–23)
CO2: 27 meq/L (ref 19–32)
Calcium: 9.5 mg/dL (ref 8.4–10.5)
Chloride: 101 meq/L (ref 96–112)
Creatinine, Ser: 1.06 mg/dL (ref 0.40–1.50)
GFR: 69.09 mL/min (ref >60.00)
Glucose, Bld: 97 mg/dL (ref 70–99)
Potassium: 4.1 meq/L (ref 3.5–5.1)
Sodium: 140 meq/L (ref 135–145)
Total Bilirubin: 0.6 mg/dL (ref 0.2–1.2)
Total Protein: 7.3 g/dL (ref 6.0–8.3)

## 2024-03-13 LAB — CBC WITH DIFFERENTIAL/PLATELET
Basophils Absolute: 0.1 10*3/uL (ref 0.0–0.1)
Basophils Relative: 0.6 % (ref 0.0–3.0)
Eosinophils Absolute: 0.3 10*3/uL (ref 0.0–0.7)
Eosinophils Relative: 2.2 % (ref 0.0–5.0)
HCT: 51 % (ref 39.0–52.0)
Hemoglobin: 17.3 g/dL — ABNORMAL HIGH (ref 13.0–17.0)
Lymphocytes Relative: 27.9 % (ref 12.0–46.0)
Lymphs Abs: 3.3 10*3/uL (ref 0.7–4.0)
MCHC: 33.9 g/dL (ref 30.0–36.0)
MCV: 92.1 fl (ref 78.0–100.0)
Monocytes Absolute: 1.1 10*3/uL — ABNORMAL HIGH (ref 0.1–1.0)
Monocytes Relative: 9.6 % (ref 3.0–12.0)
Neutro Abs: 7.1 10*3/uL (ref 1.4–7.7)
Neutrophils Relative %: 59.7 % (ref 43.0–77.0)
Platelets: 250 10*3/uL (ref 150.0–400.0)
RBC: 5.54 Mil/uL (ref 4.22–5.81)
RDW: 15.2 % (ref 11.5–15.5)
WBC: 11.9 10*3/uL — ABNORMAL HIGH (ref 4.0–10.5)

## 2024-03-13 LAB — MICROALBUMIN / CREATININE URINE RATIO
Creatinine,U: 173.1 mg/dL
Microalb Creat Ratio: 4.1 mg/g (ref 0.0–30.0)
Microalb, Ur: 0.7 mg/dL (ref 0.0–1.9)

## 2024-03-13 LAB — HEMOGLOBIN A1C: Hgb A1c MFr Bld: 6.3 % (ref 4.6–6.5)

## 2024-03-13 LAB — PSA: PSA: 4.7 ng/mL — ABNORMAL HIGH (ref 0.10–4.00)

## 2024-03-13 MED ORDER — TAMSULOSIN HCL 0.4 MG PO CAPS: 0.4000 mg | ORAL_CAPSULE | Freq: Every day | ORAL | 3 refills | Status: DC

## 2024-03-13 NOTE — Patient Instructions (Signed)
 VISIT SUMMARY:  Today, we discussed your increased urinary frequency and elevated blood pressure. We reviewed your symptoms, current medications, and medical history to develop a plan to address these issues.  YOUR PLAN:  -BENIGN PROSTATIC HYPERPLASIA (BPH): BPH is an enlarged prostate gland that can cause urinary symptoms. To help improve your urine flow, we are starting you on tamsulosin 0.4 mg daily. We will also perform a PSA test to check your prostate health and a urinalysis to rule out any infections. Please monitor your symptoms and we will re-evaluate in two weeks.  -HYPERTENSION: Hypertension is high blood pressure. Your recent readings have been elevated, and we are concerned about the effectiveness of your current medication. Tamsulosin may help lower your blood pressure as well. We will monitor your blood pressure response to this new medication and reassess in two weeks.  INSTRUCTIONS:  Please start taking tamsulosin 0.4 mg daily as prescribed. We will perform a PSA test and a urinalysis to check for any underlying issues. Monitor your blood pressure and urinary symptoms, and we will re-evaluate your condition in two weeks.

## 2024-03-13 NOTE — Progress Notes (Signed)
 Assessment/Plan:   Assessment & Plan Benign Prostatic Hyperplasia (BPH) Increased urinary frequency, urinating every two hours, with strong urine flow. Enlarged prostate, no prostate exam in five years. Currently taking a supplement with mushrooms and saw palmetto, with concerns about efficacy and safety. No dysuria, hematuria, or testicular pain. Differential includes BPH and potential diabetes due to increased thirst and urination. Tamsulosin recommended to improve urine flow and may lower blood pressure. - Start tamsulosin 0.4 mg daily to improve urine flow. - Order PSA test to assess prostate health. - Perform urinalysis to check for urinary tract infection and hematuria. - Re-evaluate symptoms and lab results in two weeks.  Hypertension Blood pressure elevated at 162/90 mmHg. Concerns about current antihypertensive medication efficacy. Tamsulosin may lower blood pressure, potentially addressing both issues. Monitoring necessary to assess impact of tamsulosin on blood pressure. - Monitor blood pressure response to tamsulosin. - Reassess blood pressure and medication efficacy in two weeks.      There are no discontinued medications.  Return in about 2 weeks (around 03/27/2024) for BP, urinary frequency, prostate.    Subjective:   Encounter date: 03/13/2024  Benjamin Romero is a 73 y.o. 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   He  has a past medical history of Colon polyps, Diverticulosis, E-coli UTI (11-02-12), Gas, Pancreatitis, and Sleep apnea.Marland Kitchen   He presents with chief complaint of Edema (Pt states that only joints are swollen in his hands, knees, and ankles x 2-3 weeks. Concerns about prostate. Ringing in right and left ears. Has labs in future that he would like completed today. Patient fasting) .   Discussed the use of AI scribe software for clinical note transcription with the patient, who gave verbal consent to proceed.  History of Present Illness Benjamin Romero is a 73 year old male who presents with urinary frequency and elevated blood pressure.  He has been experiencing increased urinary frequency for approximately one and a half months, urinating about every two hours. He describes a strong urine flow, often filling a quart bottle each time. No dysuria or hematuria. He measures his fluid intake and output, noting that he drinks about six 8-ounce glasses of water daily but produces 12 to 14 ounces of urine each time he voids.  He has a history of an enlarged prostate and has not had a prostate exam in five years. He is currently taking a supplement called [facility], which contains mushrooms, saw palmetto, and other herbs, and reports it has helped his symptoms somewhat. He has not had a PSA test recently.  He reports elevated blood pressure readings, with a recent measurement of 162/90 mmHg, which has been consistent over the past few weeks. He is concerned about whether his current medication is adequately controlling his blood pressure. His schedule changes frequently, sometimes working 20-hour shifts, which affects his sleep, as he often sleeps only four hours a day.  He has a history of traces of blood in his  urine for nearly 20 years, which was previously investigated by a  urologist with a scope, but no source was identified.     Review of Systems  All other systems reviewed and are negative.   Past Surgical History:  Procedure Laterality Date   BACK SURGERY     lumbar   COLONOSCOPY     EUS  12/31/2011   Procedure: UPPER ENDOSCOPIC ULTRASOUND (EUS) LINEAR;  Surgeon: Rob Bunting, MD;  Location: [facility];  Service: Endoscopy;  Laterality: N/A;  radial linear   HAND SURGERY Left    repair traum cut left hand   KNEE SURGERY  bil   TOTAL KNEE ARTHROPLASTY  11/03/2012   Procedure: TOTAL KNEE ARTHROPLASTY;  Surgeon: Kathryne Hitch, MD;  Location: [facility];  Service: Orthopedics;  Laterality: Left;  Left Total Knee Arthroplasty   TOTAL KNEE REVISION Left 01/05/2014   Procedure: LEFT TOTAL KNEE POLY EXCHANGE;  Surgeon: Kathryne Hitch, MD;  Location: [facility];  Service: Orthopedics;  Laterality: Left;    Outpatient Medications Prior to Visit  Medication Sig Dispense Refill   allopurinol (ZYLOPRIM) 100 MG tablet Take 1 tablet (100 mg total) by mouth daily. 30 tablet 11   amLODipine (NORVASC) 5 MG tablet Take 1 tablet (5 mg total) by mouth daily. 90 tablet 0   ascorbic acid (VITAMIN C) 1000 MG tablet Take by mouth.     hydrochlorothiazide (HYDRODIURIL) 25 MG tablet Take 1 tablet by mouth once daily 90 tablet 1   KRILL OIL PO Take by mouth.     Misc Natural Products (PROSTATE HEALTH PO) Take by mouth.     Omega-3 Fatty Acids (FISH OIL) 1000 MG CAPS Take by mouth.     Thiamine HCl (VITAMIN B-1 PO) Take 1 tablet by mouth daily.     UNABLE TO FIND Med Name: Nitric oxide     Cholecalciferol (VITAMIN D3) 25 MCG (1000 UT) capsule Take 1,000 Units by mouth daily. (Patient not taking: Reported on 03/13/2024)     meclizine (ANTIVERT) 12.5 MG tablet Take 1 tablet (12.5 mg total) by mouth 3 (three) times daily as needed for dizziness. (Patient not taking: Reported on 03/13/2024) 30 tablet 0   No facility-administered medications prior to visit.     Family History  Problem Relation Age of Onset   Thyroid cancer Mother     Social History   Socioeconomic History   Marital status: Married    Spouse name: Not on file   Number of children: 2   Years of education: Not on file   Highest education level: Not on file  Occupational History   Not on file  Tobacco Use   Smoking status: Former    Current packs/day: 0.00    Types: Cigarettes    Quit date: 12/21/2022    Years since quitting: 1.2   Smokeless tobacco: Never  Vaping Use   Vaping status: Never Used  Substance and Sexual Activity   Alcohol use: Yes    Comment: pt states "1 beer 4 months ago"   Drug use: No   Sexual activity: Yes  Other Topics Concern   Not on file  Social History Narrative   Not on file   Social Drivers of Health   Financial Resource Strain: Low Risk  (02/22/2023)   Overall Financial Resource Strain (CARDIA)    Difficulty of Paying Living Expenses: Not hard at all  Food Insecurity: No Food Insecurity (02/22/2023)   Hunger Vital Sign    Worried About  Running Out of Food in the Last Year: Never true    Ran Out of Food in the Last Year: Never true  Transportation Needs: No Transportation Needs (02/22/2023)   PRAPARE - Administrator, Civil Service (Medical): No    Lack of Transportation (Non-Medical): No  Physical Activity: Insufficiently Active (02/22/2023)   Exercise Vital Sign    Days of Exercise per Week: 7 days    Minutes of Exercise per Session: 20 min  Stress: No Stress Concern Present (02/22/2023)   [employer] of Occupational Health - Occupational Stress Questionnaire    Feeling of Stress : Not at all  Social Connections: Unknown (05/01/2022)   Received from [facility]   Social Network    Social Network: Not on file  Intimate Partner Violence: Unknown (03/23/2022)   Received from [facility], [facility]   HITS    Physically Hurt: Not on file    Insult or Talk Down To: Not on file    Threaten  Physical Harm: Not on file    Scream or Curse: Not on file                                                                                                  Objective:  Physical Exam: BP (!) 162/90   Pulse 79   Temp 97.9 F (36.6 C) (Temporal)   Wt 245 lb 6.4 oz (111.3 kg)   SpO2 98%   BMI 38.44 kg/m   BP Readings from Last 3 Encounters:  03/13/24 (!) 162/90  07/26/23 134/70  03/02/23 120/80   Wt Readings from Last 3 Encounters:  03/13/24 245 lb 6.4 oz (111.3 kg)  07/26/23 254 lb (115.2 kg)  03/02/23 257 lb 6.4 oz (116.8 kg)     Physical Exam Constitutional:      Appearance: Normal appearance.  HENT:     Head: Normocephalic and atraumatic.     Right Ear: Hearing normal.     Left Ear: Hearing normal.     Nose: Nose normal.  Eyes:     General: No scleral icterus.       Right eye: No discharge.        Left eye: No discharge.     Extraocular Movements: Extraocular movements intact.  Cardiovascular:     Rate and Rhythm: Normal rate and regular rhythm.     Heart sounds: Normal heart sounds.  Pulmonary:     Effort: Pulmonary effort is normal.     Breath sounds: Normal breath sounds.  Abdominal:     General: There is no distension.     Palpations: Abdomen is soft.     Tenderness: There is no abdominal tenderness.  Skin:    General: Skin is warm.     Findings: No rash.  Neurological:     General: No focal deficit present.     Mental Status: He is alert.     Cranial Nerves: No cranial nerve deficit.  Psychiatric:        Mood and Affect: Mood normal.        Behavior:  Behavior normal.        Thought Content: Thought content normal.        Judgment: Judgment normal.     No results found.  No results found for this or any previous visit (from the past 2160 hours).      Garner Nash, MD, MS

## 2024-03-15 LAB — URINALYSIS W MICROSCOPIC + REFLEX CULTURE
Bacteria, UA: NONE SEEN /HPF
Bilirubin Urine: NEGATIVE
Glucose, UA: NEGATIVE
Hyaline Cast: NONE SEEN /LPF
Ketones, ur: NEGATIVE
Nitrites, Initial: NEGATIVE
Protein, ur: NEGATIVE
RBC / HPF: NONE SEEN /HPF (ref 0–2)
Specific Gravity, Urine: 1.022 (ref 1.001–1.035)
pH: 7 (ref 5.0–8.0)

## 2024-03-15 LAB — URINE CULTURE
MICRO NUMBER:: 16242121
Result:: NO GROWTH
SPECIMEN QUALITY:: ADEQUATE

## 2024-03-15 LAB — CULTURE INDICATED

## 2024-03-28 ENCOUNTER — Ambulatory Visit (INDEPENDENT_AMBULATORY_CARE_PROVIDER_SITE_OTHER): Admitting: Family Medicine

## 2024-03-28 ENCOUNTER — Encounter: Payer: Self-pay | Admitting: Family Medicine

## 2024-03-28 ENCOUNTER — Encounter: Admitting: Family Medicine

## 2024-03-28 VITALS — BP 156/86 | HR 72 | Temp 97.6°F | Ht 67.0 in | Wt 255.8 lb

## 2024-03-28 DIAGNOSIS — N401 Enlarged prostate with lower urinary tract symptoms: Secondary | ICD-10-CM

## 2024-03-28 DIAGNOSIS — Z1211 Encounter for screening for malignant neoplasm of colon: Secondary | ICD-10-CM | POA: Diagnosis not present

## 2024-03-28 DIAGNOSIS — Z0000 Encounter for general adult medical examination without abnormal findings: Secondary | ICD-10-CM

## 2024-03-28 DIAGNOSIS — I10 Essential (primary) hypertension: Secondary | ICD-10-CM | POA: Diagnosis not present

## 2024-03-28 DIAGNOSIS — R7303 Prediabetes: Secondary | ICD-10-CM | POA: Diagnosis not present

## 2024-03-28 DIAGNOSIS — E790 Hyperuricemia without signs of inflammatory arthritis and tophaceous disease: Secondary | ICD-10-CM | POA: Insufficient documentation

## 2024-03-28 DIAGNOSIS — Z87891 Personal history of nicotine dependence: Secondary | ICD-10-CM | POA: Diagnosis not present

## 2024-03-28 DIAGNOSIS — R972 Elevated prostate specific antigen [PSA]: Secondary | ICD-10-CM | POA: Insufficient documentation

## 2024-03-28 DIAGNOSIS — E7800 Pure hypercholesterolemia, unspecified: Secondary | ICD-10-CM

## 2024-03-28 DIAGNOSIS — F341 Dysthymic disorder: Secondary | ICD-10-CM

## 2024-03-28 DIAGNOSIS — R350 Frequency of micturition: Secondary | ICD-10-CM

## 2024-03-28 MED ORDER — METFORMIN HCL ER 500 MG PO TB24: 500.0000 mg | ORAL_TABLET | Freq: Every evening | ORAL | 1 refills | Status: DC

## 2024-03-28 NOTE — Progress Notes (Addendum)
 Established Patient Office Visit   Subjective:  Patient ID: Benjamin Romero, male    DOB: 1951/03/26  Age: 73 y.o. MRN: [id number]  Chief Complaint  Patient presents with   Annual Exam    CPE. Pt is not fasting.     HPI Encounter Diagnoses  Name Primary?   Healthcare maintenance Yes   Screening for colon cancer    Essential hypertension    Benign prostatic hyperplasia with urinary frequency    Elevated uric acid in blood    Elevated PSA    Prediabetes    Personal history of tobacco use, presenting hazards to health    Dysthymia    Elevated cholesterol    Here for physical and follow-up of above.  He is married and lives with his wife of 50 years.  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  Blood pressure usually runs in the 120s over 70s but he forgot to take his medications today.  He checks his blood pressure twice daily.  Tells of urinary frequency with the copious urine.  Recent A1c was 6.3.  He has no history of diabetes.  Denies depression but does experience irritability with confrontation.  He simply avoids it.   Review of Systems  Constitutional: Negative.   HENT: Negative.    Eyes:  Negative for blurred vision, discharge and redness.  Respiratory: Negative.    Cardiovascular: Negative.   Gastrointestinal:  Negative for abdominal pain, blood in stool, constipation and melena.  Genitourinary:  Positive for frequency. Negative for dysuria and urgency.  Musculoskeletal: Negative.  Negative for myalgias.  Skin:  Negative for rash.  Neurological:  Negative for tingling, loss of consciousness and weakness.  Endo/Heme/Allergies:  Negative for polydipsia.      03/28/2024    2:02 PM 02/22/2023    8:50 AM 05/11/2022    2:20 PM  Depression screen PHQ 2/9  Decreased Interest 3 0 0  Down, Depressed, Hopeless 0 0 0  PHQ - 2 Score 3 0 0  Altered sleeping 3    Tired, decreased energy 0    Change in appetite 0    Feeling bad or failure about yourself  0    Trouble  concentrating 0    Moving slowly or fidgety/restless 0    Suicidal thoughts 0    PHQ-9 Score 6    Difficult doing work/chores Somewhat difficult         Current Outpatient Medications:    allopurinol  (ZYLOPRIM ) 100 MG tablet, Take 1 tablet (100 mg total) by mouth daily., Disp: 30 tablet, Rfl: 11   amLODipine  (NORVASC ) 5 MG tablet, Take 1 tablet (5 mg total) by mouth daily., Disp: 90 tablet, Rfl: 0   ascorbic acid (VITAMIN C) 1000 MG tablet, Take by mouth., Disp: , Rfl:    atorvastatin  (LIPITOR) 20 MG tablet, Take 1 tablet (20 mg total) by mouth daily., Disp: 90 tablet, Rfl: 3   hydrochlorothiazide  (HYDRODIURIL ) 25 MG tablet, Take 1 tablet by mouth once daily, Disp: 90 tablet, Rfl: 1   KRILL OIL PO, Take by mouth., Disp: , Rfl:    metFORMIN  (GLUCOPHAGE -XR) 500 MG 24 hr tablet, Take 1 tablet (500 mg total) by mouth at bedtime., Disp: 90 tablet, Rfl: 1   Misc Natural Products (PROSTATE HEALTH PO), Take by mouth., Disp: , Rfl:    Omega-3 Fatty Acids (FISH OIL) 1000 MG CAPS, Take by mouth., Disp: , Rfl:    tamsulosin  (FLOMAX ) 0.4 MG CAPS capsule, Take 1 capsule (0.4  mg total) by mouth daily., Disp: 30 capsule, Rfl: 3   Thiamine HCl (VITAMIN B-1 PO), Take 1 tablet by mouth daily., Disp: , Rfl:    Cholecalciferol (VITAMIN D3) 25 MCG (1000 UT) capsule, Take 1,000 Units by mouth daily. (Patient not taking: Reported on 03/13/2024), Disp: , Rfl:    meclizine  (ANTIVERT ) 12.5 MG tablet, Take 1 tablet (12.5 mg total) by mouth 3 (three) times daily as needed for dizziness. (Patient not taking: Reported on 03/28/2024), Disp: 30 tablet, Rfl: 0   UNABLE TO FIND, Med Name: Nitric oxide, Disp: , Rfl:    Objective:     BP (!) 156/86 (BP Location: Left Arm, Cuff Size: Large) Comment: Pt did not take Bp meds today.  Pulse 72   Temp 97.6 F (36.4 C) (Temporal)   Ht 5\' 7"  (1.702 m)   Wt 255 lb 12.8 oz (116 kg)   SpO2 98%   BMI 40.06 kg/m  BP Readings from Last 3 Encounters:  03/28/24 (!) 156/86  03/13/24  (!) 156/80  07/26/23 134/70   Wt Readings from Last 3 Encounters:  03/28/24 255 lb 12.8 oz (116 kg)  03/13/24 245 lb 6.4 oz (111.3 kg)  07/26/23 254 lb (115.2 kg)      Physical Exam Constitutional:      General: He is not in acute distress.    Appearance: Normal appearance. He is not ill-appearing, toxic-appearing or diaphoretic.  HENT:     Head: Normocephalic and atraumatic.     Right Ear: Tympanic membrane, ear canal and external ear normal.     Left Ear: Tympanic membrane, ear canal and external ear normal.     Mouth/Throat:     Mouth: Mucous membranes are moist.     Pharynx: Oropharynx is clear. No oropharyngeal exudate or posterior oropharyngeal erythema.  Eyes:     General: No scleral icterus.       Right eye: No discharge.        Left eye: No discharge.     Extraocular Movements: Extraocular movements intact.     Conjunctiva/sclera: Conjunctivae normal.     Pupils: Pupils are equal, round, and reactive to light.  Cardiovascular:     Rate and Rhythm: Normal rate and regular rhythm.  Pulmonary:     Effort: Pulmonary effort is normal. No respiratory distress.     Breath sounds: Normal breath sounds. No wheezing or rales.  Abdominal:     General: Bowel sounds are normal.     Tenderness: There is no abdominal tenderness. There is no guarding or rebound.  Musculoskeletal:     Cervical back: No rigidity or tenderness.  Skin:    General: Skin is warm and dry.  Neurological:     Mental Status: He is alert and oriented to person, place, and time.  Psychiatric:        Mood and Affect: Mood normal.        Behavior: Behavior normal.      No results found for any visits on 03/28/24.    The 10-year ASCVD risk score (Arnett DK, et al., 2019) is: 31.7%    Assessment & Plan:   Healthcare maintenance  Screening for colon cancer -     Ambulatory referral to Gastroenterology  Essential hypertension  Benign prostatic hyperplasia with urinary frequency -      Ambulatory referral to Urology  Elevated uric acid in blood -     Uric acid; Future  Elevated PSA -     Ambulatory referral to  Urology  Prediabetes -     metFORMIN  HCl ER; Take 1 tablet (500 mg total) by mouth at bedtime.  Dispense: 90 tablet; Refill: 1  Personal history of tobacco use, presenting hazards to health -     Ambulatory Referral for Lung Cancer Scre  Dysthymia -     TSH; Future  Elevated cholesterol -     Lipid panel; Future -     Atorvastatin  Calcium ; Take 1 tablet (20 mg total) by mouth daily.  Dispense: 90 tablet; Refill: 3    Return in about 3 months (around 06/27/2024), or Continue all medications..  Information was given on health maintenance and disease prevention.  Continue regular exercise and weight loss efforts.  Information was given on prediabetes and metformin .  Will go ahead and start that medication.  Continue all other medications as directed.  Recheck PSA in 3 months.  Advised him to stop smoking.  Agrees to go for screening CT of his chest.  Please take medications for hypertension daily as directed.  Tonna Frederic, MD   4/16 addendum: Have added atorvastatin  20 mg daily for improved control of cholesterol.

## 2024-03-29 ENCOUNTER — Other Ambulatory Visit (INDEPENDENT_AMBULATORY_CARE_PROVIDER_SITE_OTHER)

## 2024-03-29 DIAGNOSIS — F341 Dysthymic disorder: Secondary | ICD-10-CM

## 2024-03-29 DIAGNOSIS — E7800 Pure hypercholesterolemia, unspecified: Secondary | ICD-10-CM

## 2024-03-29 DIAGNOSIS — E790 Hyperuricemia without signs of inflammatory arthritis and tophaceous disease: Secondary | ICD-10-CM

## 2024-03-29 LAB — LIPID PANEL
Cholesterol: 268 mg/dL — ABNORMAL HIGH (ref 0–200)
HDL: 51.6 mg/dL (ref >39.00)
LDL Cholesterol: 186 mg/dL — ABNORMAL HIGH (ref 0–99)
NonHDL: 216.88
Total CHOL/HDL Ratio: 5
Triglycerides: 154 mg/dL — ABNORMAL HIGH (ref 0.0–149.0)
VLDL: 30.8 mg/dL (ref 0.0–40.0)

## 2024-03-29 LAB — TSH: TSH: 2.71 u[IU]/mL (ref 0.35–5.50)

## 2024-03-29 LAB — URIC ACID: Uric Acid, Serum: 5.7 mg/dL (ref 4.0–7.8)

## 2024-04-05 MED ORDER — ATORVASTATIN CALCIUM 20 MG PO TABS: 20.0000 mg | ORAL_TABLET | Freq: Every day | ORAL | 3 refills | Status: DC

## 2024-04-05 NOTE — Addendum Note (Signed)
 Addended by: Delene Feinstein on: 04/05/2024 08:05 PM   Modules accepted: Orders

## 2024-04-11 ENCOUNTER — Telehealth: Payer: Self-pay

## 2024-04-11 NOTE — Telephone Encounter (Signed)
 Called Pt to go over recent labs results; left VM for call back.   CRM please relay lab results to Pt and ask if he is willing to start cholesterol medication to maintain levels.

## 2024-04-11 NOTE — Telephone Encounter (Signed)
 Copied from CRM [id number]. Topic: Clinical - Lab/Test Results >> Apr 11, 2024  9:40 AM Marlan Silva wrote: Reason for CRM: Patient is requesting a call back to go over his lab results, he has some questions for Dr. Leeland Pugh nurse.

## 2024-04-17 NOTE — Telephone Encounter (Signed)
 Patient called and asked to speak with Dr. Gladys Lamp nurse regarding his lab results. I relayed the results. He stated that he Is concerned he may have prostate cancer because several of his family members have it an he is experiencing the same symptoms. He asked for the nurse to give him a call back to discuss potentially being tested for it. Please call and advise.

## 2024-04-19 ENCOUNTER — Encounter: Payer: Self-pay | Admitting: Gastroenterology

## 2024-04-20 NOTE — Addendum Note (Signed)
 Addended by: Delene Feinstein on: 04/20/2024 09:56 AM   Modules accepted: Orders

## 2024-04-24 ENCOUNTER — Ambulatory Visit: Payer: Self-pay

## 2024-04-24 NOTE — Telephone Encounter (Signed)
  Chief Complaint: worried that his HTN medication is not working for the full 24 hrs, states he takes the medication each morning and around 1800 his pressures read systolic 160-165 Symptoms: not new s/s, pt states that he has had blurry vision as well as ringing in the ears. Pt states the ringing in his ears has been going since 1974 Pertinent Negatives: Patient denies negative for new s/s Disposition: [] ED /[] Urgent Care (no appt availability in office) / [] Appointment(In office/virtual)/ []  [place] [facility]/ [] Home Care/ [] Refused Recommended Disposition /[] [place] [place] [facility]/ [x]  Follow-up with PCP Additional Notes: Pt states that he has had elevated BP readings at his 1800 BP check, 160-165.  Pt states that in the mornings 1 hr after his meds his BP systolic is 130-135. Pt would like to know what he should do, pt asking if he can take another amlodipine  at night. RN advised pt that he should take medication as prescribed. Pt agreeable.  Pt does not have ability to check BP until morning.   Copied from CRM [id number]. Topic: General - Other >> Apr 24, 2024  4:36 PM Chuck Crater wrote: Reason for CRM: Patient stated that his blood pressure is not strong enough to hold him and he wants to increase the dosage for amLODipine  (NORVASC ) 5 MG tablet. Reason for Disposition  [1] Systolic BP  >= 130 OR Diastolic >= 80 AND [2] taking BP medications  Answer Assessment - Initial Assessment Questions 1. BLOOD PRESSURE: "What is the blood pressure?" "Did you take at least two measurements 5 minutes apart?"     Pt states in the evenings his BP has been higher, states he feels that his med needs to be stronger. 160-165 systolic in the afternoon/1800, In the AM's pt states that he check his BP it is about 1 hour after taking the medication-130s systolic 2. ONSET: "When did you take your blood pressure?"     Pt states that this has been the issue since he was put on amlodipine  3. HOW: "How did you take  your blood pressure?" (e.g., automatic home BP monitor, visiting nurse)     Auto  4. HISTORY: "Do you have a history of high blood pressure?"     yes 5. MEDICINES: "Are you taking any medicines for blood pressure?" "Have you missed any doses recently?"     amlodipine  6. OTHER SYMPTOMS: "Do you have any symptoms?" (e.g., blurred vision, chest pain, difficulty breathing, headache, weakness)    Ringing in the ears, blurry vision  Protocols used: Blood Pressure - High-A-AH

## 2024-05-02 ENCOUNTER — Other Ambulatory Visit: Payer: Self-pay | Admitting: Family Medicine

## 2024-05-02 DIAGNOSIS — I10 Essential (primary) hypertension: Secondary | ICD-10-CM

## 2024-05-02 NOTE — Telephone Encounter (Signed)
 Pt states the dosage of hydrochlorothiazide  is supposed to be increased and pharmacy asked him to contact his PCP for a new prescription.

## 2024-05-02 NOTE — Telephone Encounter (Signed)
 Copied from CRM [id number]. Topic: Clinical - Medication Refill >> May 02, 2024  9:48 AM Janise Melia C wrote: Medication: hydrochlorothiazide   Patient stated that the DOSE is supposed to be increased.   Has the patient contacted their pharmacy? Yes They told him to contact the office to send a new rx   This is the patient's preferred pharmacy:  [id number] -  ([place]), [place] - [address] [place] ([place]) [place] [address] Phone: [phone] Fax: [phone]  Is this the correct pharmacy for this prescription? Yes If no, delete pharmacy and type the correct one.   Has the prescription been filled recently? No  Is the patient out of the medication? No  Has the patient been seen for an appointment in the last year OR does the patient have an upcoming appointment? Yes  Can we respond through MyChart? No  Agent: Please be advised that Rx refills may take up to 3 business days. We ask that you follow-up with your pharmacy.

## 2024-05-03 NOTE — Telephone Encounter (Signed)
 LVM informing Benjamin Romero that a message has been sent to Dr. Tilmon Font regarding his concern for the dosage of Hydrochlorothiazide . Advised that Dr. Tilmon Font is not in the office today, is due to return tomorrow and will see the message upon his return. We will contact Benjamin Romero with update as necessary.

## 2024-05-04 MED ORDER — HYDROCHLOROTHIAZIDE 25 MG PO TABS: 25.0000 mg | ORAL_TABLET | Freq: Every day | ORAL | 1 refills | Status: DC

## 2024-05-09 NOTE — Progress Notes (Signed)
 This encounter was created in error - please disregard.

## 2024-05-16 NOTE — Progress Notes (Signed)
 Chief Complaint: No chief complaint on file.   History of Present Illness:  Benjamin Romero is a 73 y.o. male who is seen in consultation from Tonna Frederic, MD for evaluation of BPH with lower urinary tract symptoms as well as elevated PSA.  PSA in March of this year was 4.70.  Previous PSA data--1.4 in 2015, performed at [facility].  He does have a family history of prostate cancer.  He has lower urinary tract symptoms including frequency, urgency, nocturia and rare urgency incontinence.  No gross hematuria, no dysuria, no foul-smelling urine.  IPSS 18/6.  He has been on tamsulosin  in the past which has not helped.   Past Medical History:  Past Medical History:  Diagnosis Date   Colon polyps    Diverticulosis    E-coli UTI 11-02-12   1 month ago- due to diet reducing med used-all clear   Gas    Pancreatitis    Sleep apnea    denies-no cpap used,dx. 2009    Past Surgical History:  Past Surgical History:  Procedure Laterality Date   BACK SURGERY     lumbar   COLONOSCOPY     EUS  12/31/2011   Procedure: UPPER ENDOSCOPIC ULTRASOUND (EUS) LINEAR;  Surgeon: Hoyt Macleod, MD;  Location: [facility];  Service: Endoscopy;  Laterality: N/A;  radial linear   HAND SURGERY Left    repair traum cut left hand   KNEE SURGERY  bil   TOTAL KNEE ARTHROPLASTY  11/03/2012   Procedure: TOTAL KNEE ARTHROPLASTY;  Surgeon: Arnie Lao, MD;  Location: [facility];  Service: Orthopedics;  Laterality: Left;  Left Total Knee Arthroplasty   TOTAL KNEE REVISION Left 01/05/2014   Procedure: LEFT TOTAL KNEE POLY EXCHANGE;  Surgeon: Arnie Lao, MD;  Location: [facility];  Service: Orthopedics;  Laterality: Left;    Allergies:  No Known Allergies  Family History:  Family History  Problem Relation Age of Onset   Thyroid  cancer Mother     Social History:  Social History   Tobacco Use   Smoking status: Former    Current packs/day: 0.00    Types: Cigarettes     Quit date: 12/21/2022    Years since quitting: 1.4   Smokeless tobacco: Never  Vaping Use   Vaping status: Never Used  Substance Use Topics   Alcohol use: Yes    Comment: pt states "1 beer 4 months ago"   Drug use: No    Review of symptoms:  Constitutional:  Negative for unexplained weight loss, night sweats, fever, chills ENT:  Negative for nose bleeds, sinus pain, painful swallowing CV:  Negative for chest pain, shortness of breath, exercise intolerance, palpitations, loss of consciousness Resp:  Negative for cough, wheezing, shortness of breath GI:  Negative for nausea, vomiting, diarrhea, bloody stools GU:  Positives noted in HPI; otherwise negative for gross hematuria, dysuria, urinary incontinence Neuro:  Negative for seizures, poor balance, limb weakness, slurred speech Psych:  Negative for lack of energy, depression, anxiety Endocrine:  Negative for polydipsia, polyuria, symptoms of hypoglycemia (dizziness, hunger, sweating) Hematologic:  Negative for anemia, purpura, petechia, prolonged or excessive bleeding, use of anticoagulants  Allergic:  Negative for difficulty breathing or choking as a result of exposure to anything; no shellfish allergy; no allergic response (rash/itch) to materials, foods  Physical exam: There were no vitals taken for this visit. GENERAL APPEARANCE:  Well appearing, well developed, well nourished, NAD HEENT: Atraumatic, Normocephalic. NECK: Normal appearance LUNGS: Normal  inspiratory and expiratory excursion HEART: Regular Rate ABDOMEN: No inguinal hernias GU: Phallus uncircumcised, normal, no lesions. Scrotal skin normal. Testicles/epididymal structures normal. Meatus normal. Normal anal sphincter tone, prostate 40 mL, symmetric, non nodular, non tender. EXTREMITIES: Moves all extremities well.  Without clubbing, cyanosis, or edema. NEUROLOGIC:  Alert and oriented x 3, normal gait, CN II-XII grossly intact.  MENTAL STATUS:  Appropriate. SKIN:   Warm, dry and intact.    Results: [facility] results reviewed  I have reviewed referring/prior physicians notes  I have reviewed urinalysis  I have reviewed PSA results  I have reviewed prior imaging--CT scan images from abdomen and pelvis performed in January 2023.  Estimated prostate volume 55 mL.  Bladder scan volume today 50 mL   Assessment: 1.  Elevated PSA.  I think probably proportionate to the size of his prostate that was measured on CT scan.  No nodularity today.  2.  Urinary frequency, urgency.  Probably more due to OAB then obstruction as he is emptying fairly well  3.  Possible cystitis, infected appearing urine Plan: 1.  I sent urine for culture, no antibiotic administered yet  2.  At this point, reassured him about his PSA.  3.  I will have his PSA rechecked after antibiotic treatment of infection, if it was present.  In a 73 year old, I would send iso-PSA before jumping ahead to biopsy.  4.  Overactive bladder instruction sheet given.  5.  I will give him a trial of solifenacin

## 2024-05-17 ENCOUNTER — Ambulatory Visit (INDEPENDENT_AMBULATORY_CARE_PROVIDER_SITE_OTHER): Admitting: Urology

## 2024-05-17 VITALS — BP 137/92 | HR 66 | Ht 72.0 in | Wt 240.0 lb

## 2024-05-17 DIAGNOSIS — R3915 Urgency of urination: Secondary | ICD-10-CM | POA: Diagnosis not present

## 2024-05-17 DIAGNOSIS — N400 Enlarged prostate without lower urinary tract symptoms: Secondary | ICD-10-CM

## 2024-05-17 DIAGNOSIS — N3000 Acute cystitis without hematuria: Secondary | ICD-10-CM | POA: Diagnosis not present

## 2024-05-17 DIAGNOSIS — R350 Frequency of micturition: Secondary | ICD-10-CM | POA: Diagnosis not present

## 2024-05-17 DIAGNOSIS — R82998 Other abnormal findings in urine: Secondary | ICD-10-CM

## 2024-05-17 DIAGNOSIS — R972 Elevated prostate specific antigen [PSA]: Secondary | ICD-10-CM | POA: Diagnosis not present

## 2024-05-17 LAB — URINALYSIS, ROUTINE W REFLEX MICROSCOPIC
Bilirubin, UA: NEGATIVE
Glucose, UA: NEGATIVE
Ketones, UA: NEGATIVE
Nitrite, UA: NEGATIVE
Protein,UA: NEGATIVE
Specific Gravity, UA: 1.02 (ref 1.005–1.030)
Urobilinogen, Ur: 0.2 mg/dL (ref 0.2–1.0)
pH, UA: 5.5 (ref 5.0–7.5)

## 2024-05-17 LAB — MICROSCOPIC EXAMINATION

## 2024-05-17 LAB — BLADDER SCAN AMB NON-IMAGING: Scan Result: 50

## 2024-05-17 MED ORDER — SOLIFENACIN SUCCINATE 10 MG PO TABS: 10.0000 mg | ORAL_TABLET | Freq: Every day | ORAL | 11 refills | Status: DC

## 2024-05-22 ENCOUNTER — Ambulatory Visit: Payer: Self-pay

## 2024-05-22 ENCOUNTER — Other Ambulatory Visit: Payer: Self-pay | Admitting: Urology

## 2024-05-22 DIAGNOSIS — N3000 Acute cystitis without hematuria: Secondary | ICD-10-CM

## 2024-05-22 LAB — URINE CULTURE

## 2024-05-22 MED ORDER — SULFAMETHOXAZOLE-TRIMETHOPRIM 800-160 MG PO TABS: 1.0000 | ORAL_TABLET | Freq: Two times a day (BID) | ORAL | 0 refills | Status: DC

## 2024-05-26 ENCOUNTER — Encounter

## 2024-05-26 ENCOUNTER — Telehealth: Payer: Self-pay

## 2024-05-26 ENCOUNTER — Ambulatory Visit

## 2024-05-26 NOTE — Progress Notes (Unsigned)
 Previous notes.

## 2024-05-26 NOTE — Telephone Encounter (Signed)
 RN twice attempted to call patient for his pre-visit appointment but had to leave vm for patient. RN told patient if we were not able to complete the pre-visit appointment we would need to cancel his procedure.   Patient did not call back prior to end of business day, so procedure was cancelled. RN sent letter to patient to let him know that the procedure has been cancelled and asked the patient to contact us  to reschedule.

## 2024-06-09 ENCOUNTER — Encounter

## 2024-06-09 ENCOUNTER — Encounter: Admitting: Gastroenterology

## 2024-06-30 ENCOUNTER — Ambulatory Visit: Admitting: Family Medicine

## 2024-07-07 ENCOUNTER — Telehealth: Payer: Self-pay | Admitting: Urology

## 2024-07-07 NOTE — Telephone Encounter (Signed)
  LVm for patient to call back and either r/s or move appointment up [facility] 07/07/24

## 2024-07-10 ENCOUNTER — Other Ambulatory Visit

## 2024-07-11 ENCOUNTER — Encounter: Admitting: Medical

## 2024-07-17 ENCOUNTER — Ambulatory Visit: Admitting: Urology

## 2024-07-19 ENCOUNTER — Encounter: Admitting: Medical

## 2024-07-21 ENCOUNTER — Telehealth: Payer: Self-pay | Admitting: Family Medicine

## 2024-07-21 NOTE — Telephone Encounter (Signed)
 03/28/2024 no show 06/30/2024 2nd no show  Pt is scheduled to see Waddell Mon, NP at [facility] 08/14/2024 for TOC.

## 2024-08-09 DIAGNOSIS — H25813 Combined forms of age-related cataract, bilateral: Secondary | ICD-10-CM | POA: Diagnosis not present

## 2024-08-14 ENCOUNTER — Encounter: Admitting: Family Medicine

## 2024-08-14 DIAGNOSIS — Z0000 Encounter for general adult medical examination without abnormal findings: Secondary | ICD-10-CM

## 2024-09-01 DIAGNOSIS — H25811 Combined forms of age-related cataract, right eye: Secondary | ICD-10-CM | POA: Diagnosis not present

## 2024-09-01 DIAGNOSIS — H2511 Age-related nuclear cataract, right eye: Secondary | ICD-10-CM | POA: Diagnosis not present

## 2024-09-01 DIAGNOSIS — F17219 Nicotine dependence, cigarettes, with unspecified nicotine-induced disorders: Secondary | ICD-10-CM | POA: Diagnosis not present

## 2024-09-01 DIAGNOSIS — I10 Essential (primary) hypertension: Secondary | ICD-10-CM | POA: Diagnosis not present

## 2024-09-29 DIAGNOSIS — H25812 Combined forms of age-related cataract, left eye: Secondary | ICD-10-CM | POA: Diagnosis not present

## 2024-09-29 DIAGNOSIS — F17219 Nicotine dependence, cigarettes, with unspecified nicotine-induced disorders: Secondary | ICD-10-CM | POA: Diagnosis not present

## 2024-09-29 DIAGNOSIS — I10 Essential (primary) hypertension: Secondary | ICD-10-CM | POA: Diagnosis not present

## 2024-09-29 DIAGNOSIS — H2512 Age-related nuclear cataract, left eye: Secondary | ICD-10-CM | POA: Diagnosis not present

## 2024-10-26 ENCOUNTER — Other Ambulatory Visit: Payer: Self-pay | Admitting: Family Medicine

## 2024-10-26 DIAGNOSIS — I10 Essential (primary) hypertension: Secondary | ICD-10-CM

## 2024-10-29 ENCOUNTER — Other Ambulatory Visit: Payer: Self-pay | Admitting: Family Medicine

## 2024-10-29 DIAGNOSIS — I10 Essential (primary) hypertension: Secondary | ICD-10-CM

## 2024-10-30 ENCOUNTER — Encounter: Payer: Self-pay | Admitting: Orthopedic Surgery

## 2024-10-30 ENCOUNTER — Other Ambulatory Visit (INDEPENDENT_AMBULATORY_CARE_PROVIDER_SITE_OTHER)

## 2024-10-30 ENCOUNTER — Ambulatory Visit: Admitting: Orthopedic Surgery

## 2024-10-30 ENCOUNTER — Other Ambulatory Visit

## 2024-10-30 DIAGNOSIS — G8929 Other chronic pain: Secondary | ICD-10-CM

## 2024-10-30 DIAGNOSIS — M25562 Pain in left knee: Secondary | ICD-10-CM

## 2024-10-30 DIAGNOSIS — M79605 Pain in left leg: Secondary | ICD-10-CM

## 2024-10-30 NOTE — Progress Notes (Signed)
 This  Office Visit Note   Patient: Benjamin Romero           Date of Birth: [birth year]           MRN: [id number] Visit Date: 10/30/2024              Requested by: No referring provider defined for this encounter. PCP: No primary care provider on file.  Chief Complaint  Patient presents with   Left Knee - Pain    10/2012 left total knee replacement 12/2013 left poly exchange    Left Leg - Pain      HPI: Discussed the use of AI scribe software for clinical note transcription with the patient, who gave verbal consent to proceed.  History of Present Illness Benjamin Romero is a 73 year old male with degenerative disc disease and left knee instability who presents with worsening knee pain and instability.  He experiences worsening pain and instability in his left knee, which has been progressively deteriorating. The pain is acute, accompanied by cracking and popping sensations, and radiates up to his hip. He uses topical treatments like Bengay and a knee sleeve for support, but the sleeve causes circulation issues if worn for extended periods. 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  He also experiences numbness on the plantar aspect of his left foot. He has a history of lumbar spine surgery 30 to 40 years ago. He experiences occasional radicular pain down the lateral left side, which is temporary and occurs mainly when standing.  His social history includes extensive travel for work, covering long distances across various states. He mentions that his wife is facing mobility issues and may require a wheelchair due to an accident, adding to his concerns about his own mobility and ability to continue working.     Assessment & Plan: Visit Diagnoses:  1. Chronic pain of left knee   2. Pain in left leg     Plan: Assessment  and Plan Assessment & Plan Instability of left total knee arthroplasty with possible loosening Instability with possible loosening of the cement mantle. Radiographic evidence supports this. No malalignment or arterial insufficiency. - Ordered CT scan of the left knee. - Scheduled follow-up appointment after CT scan results.  Degenerative disc disease of lumbar spine with left-sided radicular symptoms Degenerative disc disease with periodic left-sided radicular symptoms. Declined low dose prednisone.  Advanced tricompartmental osteoarthritis of right knee Advanced osteoarthritis with bone on bone contact at the medial joint line. No malalignment.      Follow-Up Instructions: Return if symptoms worsen or fail to improve.   Ortho Exam  Patient is alert, oriented, no adenopathy, well-dressed, normal affect, normal respiratory effort. Physical Exam CARDIOVASCULAR: Strong palpable dorsalis pedis pulse on the left. MUSCULOSKELETAL: Good, strong, straight leg raise with no quadriceps weakness. Negative straight leg raise, no sciatic symptoms. Good plantar flexion and dorsiflexion strength. Antalgic gait on the left.      Imaging: XR Knee 1-2 Views Left Result Date: 10/30/2024 2 view radiographs of the left knee shows excellent alignment and the AP view there is no varus or valgus malalignment.  There may be some loosening of the cement mantle posteriorly.  Incidental note 8 advanced tricompartmental arthritis of the right knee.  XR Lumbar Spine 2-3 Views Result Date: 10/30/2024 2 view radiographs of the  lumbar spine shows advanced degenerative disc disease through L3-L4 L5-S1 with osteophytic bone spurs joint space collapse.  There is loss of lumbar lordosis.  No images are attached to the encounter.  Labs: Lab Results  Component Value Date   HGBA1C 6.3 03/13/2024   LABURIC 5.7 03/29/2024   LABURIC 6.6 08/24/2023   REPTSTATUS 01/01/2022 FINAL 12/30/2021   GRAMSTAIN  01/05/2014     FEW WBC PRESENT, PREDOMINANTLY PMN NO SQUAMOUS EPITHELIAL CELLS SEEN NO ORGANISMS SEEN Performed at [facility]   GRAMSTAIN  01/05/2014    CYTOSPIN WBC PRESENT,BOTH PMN AND MONONUCLEAR NO ORGANISMS SEEN Gram Stain Report Called to,Read Back By and Verified With: SSABRA STARK RN AT 0900 ON 01.16.15 BY SHUEA   GRAMSTAIN  01/05/2014    CYTOSPIN SLIDE WBC PRESENT,BOTH PMN AND MONONUCLEAR NO ORGANISMS SEEN Gram Stain Report Called to,Read Back By and Verified With: Gram Stain Report Called to,Read Back By and Verified With: S.CAUDLE RN @0900  ON 1.16.15 BY SHUEA Performed by [facility] Performed at [facility]   CULT >=100,000 COLONIES/mL ESCHERICHIA COLI (A) 12/30/2021   LABORGA ESCHERICHIA COLI (A) 12/30/2021     Lab Results  Component Value Date   ALBUMIN 4.3 03/13/2024   ALBUMIN 4.0 01/19/2022   ALBUMIN 4.5 07/07/2021    No results found for: MG No results found for: VD25OH  No results found for: PREALBUMIN    Latest Ref Rng & Units 03/13/2024   11:16 AM 01/19/2022    4:22 PM 07/07/2021   12:36 PM  CBC EXTENDED  WBC 4.0 - 10.5 K/uL 11.9  12.5  11.2   RBC 4.22 - 5.81 Mil/uL 5.54  4.83  5.27   Hemoglobin 13.0 - 17.0 g/dL 82.6  85.2  83.4   HCT 39.0 - 52.0 % 51.0  43.0  48.3   Platelets 150.0 - 400.0 K/uL 250.0  293  275.0   NEUT# 1.4 - 7.7 K/uL 7.1     Lymph# 0.7 - 4.0 K/uL 3.3        There is no height or weight on file to calculate BMI.  Orders:  Orders Placed This Encounter  Procedures   XR Knee 1-2 Views Left   XR Lumbar Spine 2-3 Views   CT KNEE LEFT WO CONTRAST   No orders of the defined types were placed in this encounter.    Procedures: No procedures performed  Clinical Data: No additional findings.  ROS:  All other systems negative, except as noted in the HPI. Review of Systems  Objective: Vital Signs: There were no vitals taken for this visit.  Specialty Comments:  No specialty comments available.  PMFS  History: Patient Active Problem List   Diagnosis Date Noted   Screening for colon cancer 03/28/2024   Elevated uric acid in blood 03/28/2024   Dysthymia 03/28/2024   Prediabetes 03/28/2024   Elevated PSA 03/28/2024   Medication side effect 07/26/2023   Grieving 07/26/2023   History of pancreatitis 07/07/2021   Essential hypertension 07/07/2021   Pain in right knee 05/27/2020   Hyperlipidemia 05/10/2019   BPH (benign prostatic hyperplasia) 02/13/2017   Benign prostatic hyperplasia with urinary frequency 06/07/2014   Urinary tract infection, site not specified    Benign localized hyperplasia of prostate with urinary obstruction and other lower urinary tract symptoms (LUTS)(600.21)    Personal history of tobacco use, presenting hazards to health    Effusion of left knee joint 01/05/2014   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 2009    Family History  Problem Relation Age of Onset   Thyroid  cancer Mother     Past Surgical History:  Procedure Laterality Date   BACK SURGERY     lumbar   COLONOSCOPY     EUS  12/31/2011   Procedure: UPPER ENDOSCOPIC ULTRASOUND (EUS) LINEAR;  Surgeon: Toribio Cedar, MD;  Location: [facility];  Service: Endoscopy;  Laterality: N/A;  radial linear   HAND SURGERY Left    repair traum cut left hand   KNEE SURGERY  bil   TOTAL KNEE ARTHROPLASTY  11/03/2012    Procedure: TOTAL KNEE ARTHROPLASTY;  Surgeon: Lonni CINDERELLA Poli, MD;  Location: [facility];  Service: Orthopedics;  Laterality: Left;  Left Total Knee Arthroplasty   TOTAL KNEE REVISION Left 01/05/2014   Procedure: LEFT TOTAL KNEE POLY EXCHANGE;  Surgeon: Lonni CINDERELLA Poli, MD;  Location: [facility];  Service: Orthopedics;  Laterality: Left;   Social History   Occupational History   Not on file  Tobacco Use   Smoking status: Former    Current packs/day: 0.00    Types: Cigarettes    Quit date: 12/21/2022    Years since quitting: 1.8   Smokeless tobacco: Never  Vaping Use   Vaping status: Never Used  Substance and Sexual Activity   Alcohol use: Yes    Comment: pt states 1 beer 4 months ago   Drug use: No   Sexual activity: Yes

## 2024-11-10 ENCOUNTER — Ambulatory Visit
Admission: RE | Admit: 2024-11-10 | Discharge: 2024-11-10 | Disposition: A | Discharge status: Home / Self Care | Source: Ambulatory Visit | Attending: Orthopedic Surgery | Admitting: Orthopedic Surgery

## 2024-11-10 DIAGNOSIS — G8929 Other chronic pain: Secondary | ICD-10-CM

## 2024-11-10 DIAGNOSIS — M1712 Unilateral primary osteoarthritis, left knee: Secondary | ICD-10-CM | POA: Diagnosis not present

## 2024-12-04 NOTE — Pre-Procedure Instructions (Signed)
 Surgical Instructions   Your procedure is scheduled on December 08, 2024. Report to [facility] Main Entrance A at 6:30 A.M., then check in with the Admitting office. Any questions or running late day of surgery: call [phone]  Questions prior to your surgery date: call [phone], Monday-Friday, 8am-4pm. If you experience any cold or flu symptoms such as cough, fever, chills, shortness of breath, etc. between now and your scheduled surgery, please notify us  at the above number.     Remember:  Do not eat after midnight the night before your surgery  You may drink clear liquids until 5:30AM the morning of your surgery.   Clear liquids allowed are: Water, Non-Citrus Juices (without pulp), Carbonated Beverages, Clear Tea (no milk, honey, etc.), Black Coffee Only (NO MILK, CREAM OR POWDERED CREAMER of any kind), and Gatorade.    Take these medicines the morning of surgery with A SIP OF WATER: NONE   May take these medicines IF NEEDED:    One week prior to surgery, STOP taking any Aspirin  (unless otherwise instructed by your surgeon) Aleve, Naproxen, Ibuprofen, Motrin, Advil, Goody's, BC's, all herbal medications, fish oil, and non-prescription vitamins.                     Do NOT Smoke (Tobacco/Vaping) for 24 hours prior to your procedure.  If you use a CPAP at night, you may bring your mask/headgear for your overnight stay.   You will be asked to remove any contacts, glasses, piercing's, hearing aid's, dentures/partials prior to surgery. Please bring cases for these items if needed.    Patients discharged the day of surgery will not be allowed to drive home, and someone needs to stay with them for 24 hours.  SURGICAL WAITING ROOM VISITATION Patients may have no more than 2 support people in the waiting area - these visitors may rotate.   Pre-op nurse will coordinate an appropriate time for 1 ADULT support person, who may not rotate, to accompany patient in pre-op.   Children under the age of 66 must have an adult with them who is not the patient and must remain in the main waiting area with an adult.  If the patient needs to stay at the hospital during part of their recovery, the visitor guidelines for inpatient rooms apply.  Please refer to the [facility] website for the visitor guidelines for any additional information.   If you received a COVID test during your pre-op visit  it is requested that you wear a mask when out in public, stay away from anyone that may not be feeling well and notify your surgeon if you develop symptoms. If you have been in contact with anyone that has tested positive in the last 10 days please notify you surgeon.      Pre-operative 4 CHG Bathing Instructions   You can play a key role in reducing the risk of infection after surgery. Your skin needs to be as free of germs as possible. You can reduce the number of germs on your skin by washing with CHG (chlorhexidine gluconate) soap before surgery. CHG is an antiseptic soap that kills germs and continues to kill germs even after washing.   DO NOT use if you have an allergy to chlorhexidine/CHG or antibacterial soaps. If your skin becomes reddened or irritated, stop using the CHG and notify one of our RNs at [phone].   Please shower with the CHG soap starting 4 days before surgery using the following schedule:  Please keep in mind the following:  DO NOT shave, including legs and underarms, starting the day of your first shower.   You may shave your face at any point before/day of surgery.  Place clean sheets on your bed the day you start using CHG soap. Use a clean washcloth (not used since being washed) for each shower. DO NOT sleep with pets once you start using the CHG.   CHG Shower Instructions:  Wash your face and private area with normal soap. If you choose to wash your hair, wash first with your normal shampoo.  After you use shampoo/soap, rinse your hair  and body thoroughly to remove shampoo/soap residue.  Turn the water OFF and apply  bottle of CHG soap to a CLEAN washcloth.  Apply CHG soap ONLY FROM YOUR NECK DOWN TO YOUR TOES (washing for 3-5 minutes)  DO NOT use CHG soap on face, private areas, open wounds, or sores.  Pay special attention to the area where your surgery is being performed.  If you are having back surgery, having someone wash your back for you may be helpful. Wait 2 minutes after CHG soap is applied, then you may rinse off the CHG soap.  Pat dry with a clean towel  Put on clean clothes/pajamas   If you choose to wear lotion, please use ONLY the CHG-compatible lotions that are listed below.  Additional instructions for the day of surgery:  If you choose, you may shower the morning of surgery with an antibacterial soap.  DO NOT APPLY any lotions, deodorants, cologne, or perfumes.   Do not bring valuables to the hospital. [facility] is not responsible for any belongings/valuables. Do not wear nail polish, gel polish, artificial nails, or any other type of covering on natural nails (fingers and toes) Do not wear jewelry or makeup Put on clean/comfortable clothes.  Please brush your teeth.  Ask your nurse before applying any prescription medications to the skin.     CHG Compatible Lotions   Aveeno Moisturizing lotion  Cetaphil Moisturizing Cream  Cetaphil Moisturizing Lotion  Clairol Herbal Essence Moisturizing Lotion, Dry Skin  Clairol Herbal Essence Moisturizing Lotion, Extra Dry Skin  Clairol Herbal Essence Moisturizing Lotion, Normal Skin  Curel Age Defying Therapeutic Moisturizing Lotion with Alpha Hydroxy  Curel Extreme Care Body Lotion  Curel Soothing Hands Moisturizing Hand Lotion  Curel Therapeutic Moisturizing Cream, Fragrance-Free  Curel Therapeutic Moisturizing Lotion, Fragrance-Free  Curel Therapeutic Moisturizing Lotion, Original Formula  Eucerin Daily Replenishing Lotion  Eucerin Dry Skin  Therapy Plus Alpha Hydroxy Crme  Eucerin Dry Skin Therapy Plus Alpha Hydroxy Lotion  Eucerin Original Crme  Eucerin Original Lotion  Eucerin Plus Crme Eucerin Plus Lotion  Eucerin TriLipid Replenishing Lotion  Keri Anti-Bacterial Hand Lotion  Keri Deep Conditioning Original Lotion Dry Skin Formula Softly Scented  Keri Deep Conditioning Original Lotion, Fragrance Free Sensitive Skin Formula  Keri Lotion Fast Absorbing Fragrance Free Sensitive Skin Formula  Keri Lotion Fast Absorbing Softly Scented Dry Skin Formula  Keri Original Lotion  Keri Skin Renewal Lotion Keri Silky Smooth Lotion  Keri Silky Smooth Sensitive Skin Lotion  Nivea Body Creamy Conditioning Oil  Nivea Body Extra Enriched Lotion  Nivea Body Original Lotion  Nivea Body Sheer Moisturizing Lotion Nivea Crme  Nivea Skin Firming Lotion  NutraDerm 30 Skin Lotion  NutraDerm Skin Lotion  NutraDerm Therapeutic Skin Cream  NutraDerm Therapeutic Skin Lotion  ProShield Protective Hand Cream  Provon moisturizing lotion  Please read over the following fact sheets that  you were given.

## 2024-12-05 ENCOUNTER — Encounter (HOSPITAL_COMMUNITY): Payer: Self-pay

## 2024-12-05 ENCOUNTER — Other Ambulatory Visit: Payer: Self-pay

## 2024-12-05 ENCOUNTER — Inpatient Hospital Stay (HOSPITAL_COMMUNITY): Admission: RE | Admit: 2024-12-05 | Discharge: 2024-12-05 | Attending: Orthopedic Surgery

## 2024-12-05 VITALS — BP 140/84 | HR 71 | Temp 98.6°F | Resp 20 | Ht 72.0 in | Wt 256.7 lb

## 2024-12-05 DIAGNOSIS — Z01818 Encounter for other preprocedural examination: Secondary | ICD-10-CM | POA: Insufficient documentation

## 2024-12-05 DIAGNOSIS — I10 Essential (primary) hypertension: Secondary | ICD-10-CM

## 2024-12-05 LAB — CBC
HCT: 50.5 % (ref 39.0–52.0)
Hemoglobin: 17.4 g/dL — ABNORMAL HIGH (ref 13.0–17.0)
MCH: 31.4 pg (ref 26.0–34.0)
MCHC: 34.5 g/dL (ref 30.0–36.0)
MCV: 91.2 fL (ref 80.0–100.0)
Platelets: 293 K/uL (ref 150–400)
RBC: 5.54 MIL/uL (ref 4.22–5.81)
RDW: 13.6 % (ref 11.5–15.5)
WBC: 10.8 K/uL — ABNORMAL HIGH (ref 4.0–10.5)
nRBC: 0 % (ref 0.0–0.2)

## 2024-12-05 LAB — BASIC METABOLIC PANEL WITH GFR
Anion gap: 13 (ref 5–15)
BUN: 17 mg/dL (ref 8–23)
CO2: 22 mmol/L (ref 22–32)
Calcium: 9.8 mg/dL (ref 8.9–10.3)
Chloride: 101 mmol/L (ref 98–111)
Creatinine, Ser: 0.87 mg/dL (ref 0.61–1.24)
GFR, Estimated: >60 mL/min (ref >60)
Glucose, Bld: 110 mg/dL — ABNORMAL HIGH (ref 70–99)
Potassium: 4.2 mmol/L (ref 3.5–5.1)
Sodium: 136 mmol/L (ref 135–145)

## 2024-12-05 LAB — SURGICAL PCR SCREEN
MRSA, PCR: NEGATIVE
Staphylococcus aureus: POSITIVE — AB

## 2024-12-05 NOTE — Progress Notes (Signed)
 PCP - Berneta Elsie Sayre Cardiologist - denies  PPM/ICD - denies Device Orders - n/a Rep Notified - n/a  Chest x-ray - 01/19/2022 EKG - 12/05/2024 Stress Test - per pt 5 years ago; no follow-up was needed ECHO - denies Cardiac Cath - denies  Sleep Study - denies CPAP - no  Fasting Blood Sugar - no DM Checks Blood Sugar _____ times a day  Last dose of GLP1 agonist-  n/a GLP1 instructions: n/a  Blood Thinner Instructions: n/a Aspirin  Instructions: n/a  ERAS Protcol -yes PRE-SURGERY Ensure or G2- no  COVID TEST- n/a   Anesthesia review: Hx of HTN, EKG review; per pt he had Stress test about 5 years ago; cannot remember where and when was performed; per pt the reason for it was a regular check up.   Patient denies shortness of breath, fever, cough and chest pain at PAT appointment   All instructions explained to the patient, with a verbal understanding of the material. Patient agrees to go over the instructions while at home for a better understanding. Patient also instructed to self quarantine after being tested for COVID-19. The opportunity to ask questions was provided.

## 2024-12-05 NOTE — Pre-Procedure Instructions (Signed)
 Surgical Instructions     Your procedure is scheduled on December 08, 2024. Report to [facility] Main Entrance A at 6:30 A.M., then check in with the Admitting office. Any questions or running late day of surgery: call [phone]   Questions prior to your surgery date: call [phone], Monday-Friday, 8am-4pm. If you experience any cold or flu symptoms such as cough, fever, chills, shortness of breath, etc. between now and your scheduled surgery, please notify us  at the above number.            Remember:       Do not eat after midnight the night before your surgery   You may drink clear liquids until 5:15AM the morning of your surgery.   Clear liquids allowed are: Water, Non-Citrus Juices (without pulp), Carbonated Beverages, Clear Tea (no milk, honey, etc.), Black Coffee Only (NO MILK, CREAM OR POWDERED CREAMER of any kind), and Gatorade.          Take these medicines the morning of surgery with A SIP OF WATER: NONE     May take these medicines IF NEEDED:       One week prior to surgery, STOP taking any Aspirin  (unless otherwise instructed by your surgeon) Aleve, Naproxen, Ibuprofen, Motrin, Advil, Goody's, BC's, all herbal medications, fish oil, and non-prescription vitamins.                     Do NOT Smoke (Tobacco/Vaping) for 24 hours prior to your procedure.   If you use a CPAP at night, you may bring your mask/headgear for your overnight stay.   You will be asked to remove any contacts, glasses, piercing's, hearing aid's, dentures/partials prior to surgery. Please bring cases for these items if needed.    Patients discharged the day of surgery will not be allowed to drive home, and someone needs to stay with them for 24 hours.   SURGICAL WAITING ROOM VISITATION Patients may have no more than 2 support people in the waiting area - these visitors may rotate.   Pre-op nurse will coordinate an appropriate time for 1 ADULT support person, who may not rotate, to accompany  patient in pre-op.  Children under the age of 46 must have an adult with them who is not the patient and must remain in the main waiting area with an adult.   If the patient needs to stay at the hospital during part of their recovery, the visitor guidelines for inpatient rooms apply.   Please refer to the [facility] website for the visitor guidelines for any additional information.     If you received a COVID test during your pre-op visit  it is requested that you wear a mask when out in public, stay away from anyone that may not be feeling well and notify your surgeon if you develop symptoms. If you have been in contact with anyone that has tested positive in the last 10 days please notify you surgeon.         Pre-operative 4 CHG Bathing Instructions    You can play a key role in reducing the risk of infection after surgery. Your skin needs to be as free of germs as possible. You can reduce the number of germs on your skin by washing with CHG (chlorhexidine gluconate) soap before surgery. CHG is an antiseptic soap that kills germs and continues to kill germs even after washing.    DO NOT use if you have an allergy to chlorhexidine/CHG or  antibacterial soaps. If your skin becomes reddened or irritated, stop using the CHG and notify one of our RNs at [phone].    Please shower with the CHG soap starting 4 days before surgery using the following schedule:       Please keep in mind the following:  DO NOT shave, including legs and underarms, starting the day of your first shower.   You may shave your face at any point before/day of surgery.  Place clean sheets on your bed the day you start using CHG soap. Use a clean washcloth (not used since being washed) for each shower. DO NOT sleep with pets once you start using the CHG.    CHG Shower Instructions:  Wash your face and private area with normal soap. If you choose to wash your hair, wash first with your normal shampoo.  After you  use shampoo/soap, rinse your hair and body thoroughly to remove shampoo/soap residue.  Turn the water OFF and apply  bottle of CHG soap to a CLEAN washcloth.  Apply CHG soap ONLY FROM YOUR NECK DOWN TO YOUR TOES (washing for 3-5 minutes)  DO NOT use CHG soap on face, private areas, open wounds, or sores.  Pay special attention to the area where your surgery is being performed.  If you are having back surgery, having someone wash your back for you may be helpful. Wait 2 minutes after CHG soap is applied, then you may rinse off the CHG soap.  Pat dry with a clean towel  Put on clean clothes/pajamas   If you choose to wear lotion, please use ONLY the CHG-compatible lotions that are listed below.   Additional instructions for the day of surgery:   If you choose, you may shower the morning of surgery with an antibacterial soap.  DO NOT APPLY any lotions, deodorants, cologne, or perfumes.   Do not bring valuables to the hospital. [facility] is not responsible for any belongings/valuables. Do not wear nail polish, gel polish, artificial nails, or any other type of covering on natural nails (fingers and toes) Do not wear jewelry or makeup Put on clean/comfortable clothes.  Please brush your teeth.  Ask your nurse before applying any prescription medications to the skin.        CHG Compatible Lotions    Aveeno Moisturizing lotion  Cetaphil Moisturizing Cream  Cetaphil Moisturizing Lotion  Clairol Herbal Essence Moisturizing Lotion, Dry Skin  Clairol Herbal Essence Moisturizing Lotion, Extra Dry Skin  Clairol Herbal Essence Moisturizing Lotion, Normal Skin  Curel Age Defying Therapeutic Moisturizing Lotion with Alpha Hydroxy  Curel Extreme Care Body Lotion  Curel Soothing Hands Moisturizing Hand Lotion  Curel Therapeutic Moisturizing Cream, Fragrance-Free  Curel Therapeutic Moisturizing Lotion, Fragrance-Free  Curel Therapeutic Moisturizing Lotion, Original Formula  Eucerin Daily  Replenishing Lotion  Eucerin Dry Skin Therapy Plus Alpha Hydroxy Crme  Eucerin Dry Skin Therapy Plus Alpha Hydroxy Lotion  Eucerin Original Crme  Eucerin Original Lotion  Eucerin Plus Crme Eucerin Plus Lotion  Eucerin TriLipid Replenishing Lotion  Keri Anti-Bacterial Hand Lotion  Keri Deep Conditioning Original Lotion Dry Skin Formula Softly Scented  Keri Deep Conditioning Original Lotion, Fragrance Free Sensitive Skin Formula  Keri Lotion Fast Absorbing Fragrance Free Sensitive Skin Formula  Keri Lotion Fast Absorbing Softly Scented Dry Skin Formula  Keri Original Lotion  Keri Skin Renewal Lotion Keri Silky Smooth Lotion  Keri Silky Smooth Sensitive Skin Lotion  Nivea Body Creamy Conditioning Oil  Nivea Body Extra Enriched Education Administrator  Original Lotion  Nivea Body Sheer Moisturizing Lotion Nivea Crme  Nivea Skin Firming Lotion  NutraDerm 30 Skin Lotion  NutraDerm Skin Lotion  NutraDerm Therapeutic Skin Cream  NutraDerm Therapeutic Skin Lotion  ProShield Protective Hand Cream  Provon moisturizing lotion   Please read over the following fact sheets that you were given.

## 2024-12-06 ENCOUNTER — Encounter (HOSPITAL_COMMUNITY): Payer: Self-pay

## 2024-12-06 ENCOUNTER — Ambulatory Visit: Payer: Self-pay

## 2024-12-06 ENCOUNTER — Other Ambulatory Visit: Payer: Self-pay | Admitting: Family

## 2024-12-06 MED ORDER — MUPIROCIN 2 % EX OINT: 1.0000 | TOPICAL_OINTMENT | Freq: Two times a day (BID) | CUTANEOUS | 0 refills | Status: AC

## 2024-12-06 NOTE — Telephone Encounter (Signed)
-----   Message from Jerona Sage, MD sent at 12/05/2024  5:10 PM EST ----- Have him swab his nose with a Q-tip and Bactroban  twice a day from now until surgery.

## 2024-12-06 NOTE — Progress Notes (Signed)
 Anesthesia Chart Review:   Case: [id number] Date/Time: 12/08/24 0800   Procedure: TOTAL KNEE REVISION (Left: Knee) - REVISION POLY TRAY LEFT TOTAL KNEE ARTHROPLASTY   Anesthesia type: Choice   Diagnosis: Loosening of prosthesis of left total knee replacement, initial encounter [T84.033A]   Pre-op diagnosis: Loose Left Total Knee Arthroplasty   [facility] ROOM 05 / [facility] OR   Surgeons: Harden Jerona GAILS, MD       DISCUSSION: Patient is a 73 year old male scheduled for the above procedure.  History includes smoker, HTN, OSA (moderate 2009, does not use CPAP), pancreatitis (idiopathic pancreatitis ~ 2012 & 01/2017), prediabetes, spinal surgery, osteoarthritis (left TKA 11/03/2012, s/p I&D partial synovectomy & polyethylene insert exchange 01/07/2024 for recurrent effusions/instability), BPH. BMI is consistent with obesity.   Preoperative labs noted EKG noted. He denied chest pain and SOB.   Anesthesia team to evaluate on the day of surgery.    VS: BP (!) 140/84   Pulse 71   Temp 37 C (Oral)   Resp 20   Ht 6' (1.829 m)   Wt 116.4 kg   SpO2 98%   BMI 34.81 kg/m    PROVIDERS: Berneta Elsie Sayre, MD is PCP, established since 07/07/2021. Last visit 03/28/2024 for CPE.   Matilda Senior, MD is urologist   LABS: Labs reviewed: Acceptable for surgery. A1c 6.3% 03/13/2024.  (all labs ordered are listed, but only abnormal results are displayed)  Labs Reviewed  SURGICAL PCR SCREEN - Abnormal; Notable for the following components:      Result Value   Staphylococcus aureus POSITIVE (*)    All other components within normal limits  BASIC METABOLIC PANEL WITH GFR - Abnormal; Notable for the following components:   Glucose, Bld 110 (*)    All other components within normal limits  CBC - Abnormal; Notable for the following components:   WBC 10.8 (*)    Hemoglobin 17.4 (*)    All other components within normal limits    Sleep Study 02/12/2008: In the supine position the AHI was 43.3  events per hour, REM AHI was 42.9 events per hour and REM supine AHI was 72.6 events per hour.  The longest apnea duration was 30.1 seconds and the longest hypopnea duration was 53.7 seconds. IMPRESSION: 1. Moderate degree of obstructive sleep apnea with hypopneas causing sleep fragmentation and desaturations.  2. Significant periodic limb movements, however, these were not associated with arousals. 3. No evidence of cardiac arrhythmias.    IMAGES: CT Left Knee 11/10/2024: IMPRESSION: 1. Total knee prosthesis without periprosthetic fracture or abnormal lucency. 2. Clustered ossific fragments in the expected location of a Baker cyst, possibly representing osteochondral fragments loose within a surrounding Baker cyst; largest fragment measures 1.8 cm. 3. Small knee joint effusion in the suprapatellar bursa region.    EKG: 12/05/2024: Normal sinus rhythm When compared with ECG of 19-Jan-2022 18:51, PREVIOUS ECG IS PRESENT No significant change since last tracing Confirmed by Wendel Haws ([id number]) on 12/05/2024 10:02:01 PM   CV: He believes he had a stress test 5 years ago with as needed follow-up. 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    Past Medical History:  Diagnosis Date   Colon polyps    Diverticulosis    E-coli UTI 11/02/2012   1 month ago- due to diet reducing med used-all clear   Gas    Hypertension    Pancreatitis  Sleep apnea    denies-no cpap used,dx. 2009    Past Surgical History:  Procedure Laterality Date   BACK SURGERY     lumbar   COLONOSCOPY     EUS  12/31/2011   Procedure: UPPER ENDOSCOPIC ULTRASOUND (EUS) LINEAR;  Surgeon: Toribio Cedar, MD;  Location: [facility] ENDOSCOPY;  Service: Endoscopy;  Laterality: N/A;  radial linear   HAND SURGERY Left    repair traum cut left hand   KNEE SURGERY  bil   TOTAL KNEE ARTHROPLASTY  11/03/2012   Procedure: TOTAL KNEE ARTHROPLASTY;  Surgeon: Lonni CINDERELLA Poli, MD;  Location: [facility] ORS;  Service: Orthopedics;  Laterality: Left;  Left Total Knee Arthroplasty   TOTAL KNEE REVISION Left 01/05/2014   Procedure: LEFT TOTAL KNEE POLY EXCHANGE;  Surgeon: Lonni CINDERELLA Poli, MD;  Location: [facility] ORS;  Service: Orthopedics;  Laterality: Left;    MEDICATIONS:  ascorbic acid (VITAMIN C) 1000 MG tablet   B Complex-Biotin-FA (B-100 B-COMPLEX PO)   hydrochlorothiazide  (HYDRODIURIL ) 25 MG tablet   Misc Natural Products (PROSTATE HEALTH PO)   mupirocin  ointment (BACTROBAN ) 2 %   No current facility-administered medications for this encounter.    Isaiah Ruder, PA-C Surgical Short Stay/Anesthesiology [facility] Phone [phone] [facility] Phone [phone] 12/06/2024 5:04 PM

## 2024-12-06 NOTE — Telephone Encounter (Signed)
 Called and sw pt to advise of message below. To have Bactroban  sent to pharmacy and to swab each nostril twice a day every day in prep for surgery on Friday

## 2024-12-06 NOTE — Anesthesia Preprocedure Evaluation (Signed)
 Anesthesia Evaluation    Airway        Dental   Pulmonary Current Smoker          Cardiovascular hypertension,      Neuro/Psych    GI/Hepatic   Endo/Other    Renal/GU      Musculoskeletal   Abdominal   Peds  Hematology   Anesthesia Other Findings   Reproductive/Obstetrics                              Anesthesia Physical Anesthesia Plan  ASA:   Anesthesia Plan:    Post-op Pain Management:    Induction:   PONV Risk Score and Plan:   Airway Management Planned:   Additional Equipment:   Intra-op Plan:   Post-operative Plan:   Informed Consent:   Plan Discussed with:   Anesthesia Plan Comments: (PAT note written 12/06/2024 by Ellias Mcelreath, PA-C.  )        Anesthesia Quick Evaluation

## 2024-12-07 NOTE — H&P (Signed)
 TOTAL KNEE REVISION ADMISSION H&P  Patient is being admitted for left revision total knee arthroplasty.  Subjective:  Chief Complaint:left knee pain.  HPI: NIAL HAWE, 73 y.o. male, has a history of pain and functional disability in the left knee(s) due to arthritis and patient has failed non-surgical conservative treatments for greater than 12 weeks to include NSAID's and/or analgesics, flexibility and strengthening excercises, and activity modification. The indications for the revision of the total knee arthroplasty are fracture or mechanical failure of one or components. Onset of symptoms was gradual starting 2 years ago with gradually worsening course since that time.  Prior procedures on the left knee(s) include arthroplasty.  Patient currently rates pain in the left knee(s) at 8 out of 10 with activity. There is worsening of pain with activity and weight bearing.  Patient has evidence of Instability of left total knee arthroplasty with possible loosening Instability with possible loosening of the cement mantle. by imaging studies. This condition presents safety issues increasing the risk of falls.  There is no current active infection.  Patient Active Problem List   Diagnosis Date Noted   Screening for colon cancer 03/28/2024   Elevated uric acid in blood 03/28/2024   Dysthymia 03/28/2024   Prediabetes 03/28/2024   Elevated PSA 03/28/2024   Medication side effect 07/26/2023   Grieving 07/26/2023   History of pancreatitis 07/07/2021   Essential hypertension 07/07/2021   Pain in right knee 05/27/2020   Hyperlipidemia 05/10/2019   BPH (benign prostatic hyperplasia) 02/13/2017   Benign prostatic hyperplasia with urinary frequency 06/07/2014   Urinary tract infection, site not specified    Benign localized hyperplasia of prostate with urinary obstruction and other lower urinary tract symptoms (LUTS)(600.21)    Personal history of tobacco use, presenting hazards to health     Effusion of left knee joint 01/05/2014   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   Arthritis of knee, left 11/03/2012   Nonspecific (abnormal) findings on radiological and other examination of gastrointestinal tract 12/31/2011   Gastritis and gastroduodenitis 12/31/2011   Duodenitis 12/31/2011   History of colonic polyps 11/10/2011   RESTLESS LEG SYNDROME 03/01/2008   Obesity, morbid (HCC) 02/09/2008   Acute pancreatitis 01/30/2008   Past Medical History:  Diagnosis Date   Colon polyps    Diverticulosis    E-coli UTI 11/02/2012   1 month ago- due to diet reducing med used-all clear   Gas    Hypertension    Pancreatitis    ~ 20212, 01/2017 (idiopathic)   Prediabetes    Sleep apnea    denies-no cpap used,dx. 2009    Past Surgical History:  Procedure Laterality Date   BACK SURGERY     lumbar   COLONOSCOPY     EUS  12/31/2011   Procedure: UPPER ENDOSCOPIC ULTRASOUND (EUS) LINEAR;  Surgeon: Toribio Cedar, MD;  Location: [facility];  Service: Endoscopy;  Laterality: N/A;  radial linear   HAND SURGERY Left    repair traum cut left hand   KNEE SURGERY  bil   TOTAL KNEE ARTHROPLASTY  11/03/2012   Procedure: TOTAL KNEE ARTHROPLASTY;  Surgeon: Lonni CINDERELLA Poli, MD;  Location: [facility] ORS;  Service: Orthopedics;  Laterality: Left;  Left Total Knee Arthroplasty   TOTAL KNEE REVISION Left 01/05/2014   Procedure: LEFT TOTAL KNEE POLY EXCHANGE;  Surgeon: Lonni CINDERELLA Poli, MD;  Location: [facility] ORS;  Service: Orthopedics;  Laterality: Left;    No current facility-administered medications for this encounter.   Current Outpatient  Medications  Medication Sig Dispense Refill Last Dose/Taking   ascorbic acid (VITAMIN C) 1000 MG tablet Take 1,000 mg by mouth daily.   Taking   B Complex-Biotin-FA (B-100 B-COMPLEX PO) Take 1 tablet by mouth daily.   Taking   hydrochlorothiazide  (HYDRODIURIL ) 25 MG tablet Take 1 tablet (25 mg total) by mouth daily. 90 tablet 1 Taking   Misc  Natural Products (PROSTATE HEALTH PO) Take 1 tablet by mouth daily.   Taking   mupirocin  ointment (BACTROBAN ) 2 % Apply 1 Application topically 2 (two) times daily. 22 g 0    Allergies[1]  Social History   Tobacco Use   Smoking status: Every Day    Current packs/day: 0.00    Average packs/day: 2.0 packs/day    Types: Cigarettes    Last attempt to quit: 12/21/2022    Years since quitting: 1.9   Smokeless tobacco: Never  Substance Use Topics   Alcohol use: Yes    Comment: pt states 1 beer 4 months ago    Family History  Problem Relation Age of Onset   Thyroid  cancer Mother       Review of Systems  All other systems reviewed and are negative.    Objective:  Physical Exam  Vital signs in last 24 hours:    Labs:  Estimated body mass index is 34.81 kg/m as calculated from the following:   Height as of 12/05/24: 6' (1.829 m).   Weight as of 12/05/24: 116.4 kg.  Imaging Review XR Knee 1-2 Views Left Result Date: 10/30/2024 2 view radiographs of the left knee shows excellent alignment and the AP view there is no varus or valgus malalignment.  There may be some loosening of the cement mantle posteriorly.  Incidental note 8 advanced tricompartmental arthritis of the right knee.  CT scan 11/10/24 IMPRESSION: 1. Total knee prosthesis without periprosthetic fracture or abnormal lucency. 2. Clustered ossific fragments in the expected location of a Baker cyst, possibly representing osteochondral fragments loose within a surrounding Baker cyst; largest fragment measures 1.8 cm. 3. Small knee joint effusion in the suprapatellar bursa region.  Assessment/Plan:  End stage arthritis, left knee(s) with failed previous arthroplasty.   The patient history, physical examination, clinical judgment of the provider and imaging studies are consistent with end stage degenerative joint disease of the left knee(s), previous total knee arthroplasty. Revision total knee arthroplasty is deemed  medically necessary. The treatment options including medical management, injection therapy, arthroscopy and revision arthroplasty were discussed at length. The risks and benefits of revision total knee arthroplasty were presented and reviewed. The risks due to aseptic loosening, infection, stiffness, patella tracking problems, thromboembolic complications and other imponderables were discussed. The patient acknowledged the explanation, agreed to proceed with the plan and consent was signed. Patient is being admitted for inpatient treatment for surgery, pain control, PT, OT, prophylactic antibiotics, VTE prophylaxis, progressive ambulation and ADL's and discharge planning.The patient is planning to be discharged home with home health services  Maurilio Deland Collet PA-C [phone]    [1] No Known Allergies

## 2024-12-08 ENCOUNTER — Inpatient Hospital Stay (HOSPITAL_COMMUNITY): Payer: Self-pay

## 2024-12-08 ENCOUNTER — Other Ambulatory Visit: Payer: Self-pay

## 2024-12-08 ENCOUNTER — Encounter (HOSPITAL_COMMUNITY): Payer: Self-pay | Admitting: Orthopedic Surgery

## 2024-12-08 ENCOUNTER — Inpatient Hospital Stay (HOSPITAL_COMMUNITY)
Admission: RE | Admit: 2024-12-08 | Discharge: 2024-12-09 | DRG: 468 | Disposition: A | Discharge status: Home / Self Care | Attending: Orthopedic Surgery | Admitting: Orthopedic Surgery

## 2024-12-08 ENCOUNTER — Encounter (HOSPITAL_COMMUNITY): Payer: Self-pay | Admitting: Vascular Surgery

## 2024-12-08 ENCOUNTER — Encounter: Admission: RE | Disposition: A | Payer: Self-pay | Attending: Orthopedic Surgery

## 2024-12-08 DIAGNOSIS — I10 Essential (primary) hypertension: Secondary | ICD-10-CM | POA: Diagnosis present

## 2024-12-08 DIAGNOSIS — M25462 Effusion, left knee: Principal | ICD-10-CM

## 2024-12-08 DIAGNOSIS — G4733 Obstructive sleep apnea (adult) (pediatric): Secondary | ICD-10-CM | POA: Diagnosis present

## 2024-12-08 DIAGNOSIS — M1712 Unilateral primary osteoarthritis, left knee: Secondary | ICD-10-CM | POA: Diagnosis present

## 2024-12-08 DIAGNOSIS — T84018A Broken internal joint prosthesis, other site, initial encounter: Secondary | ICD-10-CM

## 2024-12-08 DIAGNOSIS — R7303 Prediabetes: Secondary | ICD-10-CM | POA: Diagnosis present

## 2024-12-08 DIAGNOSIS — F17210 Nicotine dependence, cigarettes, uncomplicated: Secondary | ICD-10-CM | POA: Diagnosis present

## 2024-12-08 DIAGNOSIS — E785 Hyperlipidemia, unspecified: Secondary | ICD-10-CM | POA: Diagnosis present

## 2024-12-08 DIAGNOSIS — T84093A Other mechanical complication of internal left knee prosthesis, initial encounter: Secondary | ICD-10-CM | POA: Diagnosis not present

## 2024-12-08 DIAGNOSIS — Z79899 Other long term (current) drug therapy: Secondary | ICD-10-CM | POA: Diagnosis not present

## 2024-12-08 DIAGNOSIS — Z96659 Presence of unspecified artificial knee joint: Secondary | ICD-10-CM

## 2024-12-08 DIAGNOSIS — Y792 Prosthetic and other implants, materials and accessory orthopedic devices associated with adverse incidents: Secondary | ICD-10-CM | POA: Diagnosis present

## 2024-12-08 DIAGNOSIS — T84033A Mechanical loosening of internal left knee prosthetic joint, initial encounter: Principal | ICD-10-CM | POA: Diagnosis present

## 2024-12-08 DIAGNOSIS — G2581 Restless legs syndrome: Secondary | ICD-10-CM | POA: Diagnosis present

## 2024-12-08 DIAGNOSIS — Z96652 Presence of left artificial knee joint: Secondary | ICD-10-CM

## 2024-12-08 DIAGNOSIS — N400 Enlarged prostate without lower urinary tract symptoms: Secondary | ICD-10-CM | POA: Diagnosis present

## 2024-12-08 HISTORY — PX: TOTAL KNEE REVISION: SHX996

## 2024-12-08 SURGERY — TOTAL KNEE REVISION
Anesthesia: Spinal | Site: Knee | Laterality: Left

## 2024-12-08 MED ORDER — LACTATED RINGERS IV SOLN: INTRAVENOUS | Status: DC

## 2024-12-08 MED ORDER — EPHEDRINE 5 MG/ML INJ
INTRAVENOUS | Status: AC
  Filled 2024-12-08: qty 5

## 2024-12-08 MED ORDER — ORAL CARE MOUTH RINSE: 15.0000 mL | Freq: Once | OROMUCOSAL | Status: AC

## 2024-12-08 MED ORDER — DEXAMETHASONE SOD PHOSPHATE PF 10 MG/ML IJ SOLN
INTRAMUSCULAR | Status: DC | PRN
  Administered 2024-12-08: 8 mg via INTRAVENOUS

## 2024-12-08 MED ORDER — HYDROMORPHONE HCL 1 MG/ML IJ SOLN
0.5000 mg | INTRAMUSCULAR | Status: DC | PRN
  Administered 2024-12-08: 1 mg via INTRAVENOUS
  Filled 2024-12-08: qty 1

## 2024-12-08 MED ORDER — OXYCODONE HCL 5 MG/5ML PO SOLN: 5.0000 mg | Freq: Once | ORAL | Status: DC | PRN

## 2024-12-08 MED ORDER — ONDANSETRON HCL 4 MG/2ML IJ SOLN: 4.0000 mg | Freq: Four times a day (QID) | INTRAMUSCULAR | Status: DC | PRN

## 2024-12-08 MED ORDER — DOCUSATE SODIUM 100 MG PO CAPS
100.0000 mg | ORAL_CAPSULE | Freq: Two times a day (BID) | ORAL | Status: DC
  Administered 2024-12-08 – 2024-12-09 (×2): 100 mg via ORAL
  Filled 2024-12-08 (×2): qty 1

## 2024-12-08 MED ORDER — MENTHOL 3 MG MT LOZG: 1.0000 | LOZENGE | OROMUCOSAL | Status: DC | PRN

## 2024-12-08 MED ORDER — OXYCODONE HCL 5 MG PO TABS: 5.0000 mg | ORAL_TABLET | Freq: Once | ORAL | Status: DC | PRN

## 2024-12-08 MED ORDER — SODIUM CHLORIDE 0.9 % IV SOLN: INTRAVENOUS | Status: AC

## 2024-12-08 MED ORDER — ACETAMINOPHEN 500 MG PO TABS
1000.0000 mg | ORAL_TABLET | Freq: Once | ORAL | Status: AC
  Administered 2024-12-08: 1000 mg via ORAL
  Filled 2024-12-08: qty 2

## 2024-12-08 MED ORDER — MIDAZOLAM HCL 2 MG/2ML IJ SOLN
INTRAMUSCULAR | Status: AC
  Filled 2024-12-08: qty 2

## 2024-12-08 MED ORDER — PHENOL 1.4 % MT LIQD: 1.0000 | OROMUCOSAL | Status: DC | PRN

## 2024-12-08 MED ORDER — PROPOFOL 10 MG/ML IV BOLUS
INTRAVENOUS | Status: AC
  Filled 2024-12-08: qty 20

## 2024-12-08 MED ORDER — PROPOFOL 500 MG/50ML IV EMUL
INTRAVENOUS | Status: DC | PRN
  Administered 2024-12-08: 100 ug/kg/min via INTRAVENOUS

## 2024-12-08 MED ORDER — CEFAZOLIN SODIUM-DEXTROSE 2-4 GM/100ML-% IV SOLN
2.0000 g | INTRAVENOUS | Status: AC
  Administered 2024-12-08: 2 g via INTRAVENOUS
  Filled 2024-12-08: qty 100

## 2024-12-08 MED ORDER — METOCLOPRAMIDE HCL 5 MG PO TABS: 5.0000 mg | ORAL_TABLET | Freq: Three times a day (TID) | ORAL | Status: DC | PRN

## 2024-12-08 MED ORDER — BUPIVACAINE HCL (PF) 0.5 % IJ SOLN
INTRAMUSCULAR | Status: DC | PRN
  Administered 2024-12-08: 3 mL via INTRATHECAL

## 2024-12-08 MED ORDER — ONDANSETRON HCL 4 MG/2ML IJ SOLN
INTRAMUSCULAR | Status: DC | PRN
  Administered 2024-12-08: 4 mg via INTRAVENOUS

## 2024-12-08 MED ORDER — LIDOCAINE 2% (20 MG/ML) 5 ML SYRINGE
INTRAMUSCULAR | Status: AC
  Filled 2024-12-08: qty 5

## 2024-12-08 MED ORDER — OXYCODONE HCL 5 MG PO TABS
5.0000 mg | ORAL_TABLET | ORAL | Status: DC | PRN
  Administered 2024-12-09: 10 mg via ORAL
  Filled 2024-12-08: qty 2

## 2024-12-08 MED ORDER — PHENYLEPHRINE 80 MCG/ML (10ML) SYRINGE FOR IV PUSH (FOR BLOOD PRESSURE SUPPORT)
PREFILLED_SYRINGE | INTRAVENOUS | Status: AC
  Filled 2024-12-08: qty 10

## 2024-12-08 MED ORDER — HYDROCHLOROTHIAZIDE 25 MG PO TABS
25.0000 mg | ORAL_TABLET | Freq: Every day | ORAL | Status: DC
  Administered 2024-12-09: 25 mg via ORAL
  Filled 2024-12-08: qty 1

## 2024-12-08 MED ORDER — CEFAZOLIN SODIUM-DEXTROSE 2-4 GM/100ML-% IV SOLN
2.0000 g | Freq: Four times a day (QID) | INTRAVENOUS | Status: AC
  Administered 2024-12-08 (×2): 2 g via INTRAVENOUS
  Filled 2024-12-08 (×2): qty 100

## 2024-12-08 MED ORDER — VASHE WOUND IRRIGATION OPTIME
TOPICAL | Status: DC | PRN
  Administered 2024-12-08 (×2): 34 [oz_av]

## 2024-12-08 MED ORDER — CHLORHEXIDINE GLUCONATE 0.12 % MT SOLN
15.0000 mL | Freq: Once | OROMUCOSAL | Status: AC
  Administered 2024-12-08: 15 mL via OROMUCOSAL
  Filled 2024-12-08: qty 15

## 2024-12-08 MED ORDER — PHENYLEPHRINE HCL-NACL 20-0.9 MG/250ML-% IV SOLN
INTRAVENOUS | Status: DC | PRN
  Administered 2024-12-08: 30 ug/min via INTRAVENOUS

## 2024-12-08 MED ORDER — FENTANYL CITRATE (PF) 100 MCG/2ML IJ SOLN: 25.0000 ug | INTRAMUSCULAR | Status: DC | PRN

## 2024-12-08 MED ORDER — PROPOFOL 10 MG/ML IV BOLUS
INTRAVENOUS | Status: DC | PRN
  Administered 2024-12-08 (×2): 20 mg via INTRAVENOUS

## 2024-12-08 MED ORDER — ACETAMINOPHEN 500 MG PO TABS
1000.0000 mg | ORAL_TABLET | Freq: Four times a day (QID) | ORAL | Status: AC
  Administered 2024-12-08 – 2024-12-09 (×3): 1000 mg via ORAL
  Filled 2024-12-08 (×3): qty 2

## 2024-12-08 MED ORDER — OXYCODONE HCL 5 MG PO TABS: 10.0000 mg | ORAL_TABLET | ORAL | Status: DC | PRN

## 2024-12-08 MED ORDER — MUPIROCIN 2 % EX OINT
1.0000 | TOPICAL_OINTMENT | Freq: Two times a day (BID) | CUTANEOUS | Status: DC
  Administered 2024-12-08 – 2024-12-09 (×2): 1 via TOPICAL
  Filled 2024-12-08 (×2): qty 22

## 2024-12-08 MED ORDER — FENTANYL CITRATE (PF) 250 MCG/5ML IJ SOLN
INTRAMUSCULAR | Status: DC | PRN
  Administered 2024-12-08: 100 ug via INTRAVENOUS

## 2024-12-08 MED ORDER — METOCLOPRAMIDE HCL 5 MG/ML IJ SOLN: 5.0000 mg | Freq: Three times a day (TID) | INTRAMUSCULAR | Status: DC | PRN

## 2024-12-08 MED ORDER — FENTANYL CITRATE (PF) 100 MCG/2ML IJ SOLN
INTRAMUSCULAR | Status: AC
  Filled 2024-12-08: qty 2

## 2024-12-08 MED ORDER — BUPIVACAINE HCL (PF) 0.25 % IJ SOLN
INTRAMUSCULAR | Status: DC | PRN
  Administered 2024-12-08: 20 mL via PERINEURAL

## 2024-12-08 MED ORDER — PROPOFOL 1000 MG/100ML IV EMUL
INTRAVENOUS | Status: AC
  Filled 2024-12-08: qty 100

## 2024-12-08 MED ORDER — DROPERIDOL 2.5 MG/ML IJ SOLN: 0.6250 mg | Freq: Once | INTRAMUSCULAR | Status: DC | PRN

## 2024-12-08 MED ORDER — VITAMIN C 500 MG PO TABS
1000.0000 mg | ORAL_TABLET | Freq: Every day | ORAL | Status: DC
  Administered 2024-12-09: 1000 mg via ORAL
  Filled 2024-12-08: qty 2

## 2024-12-08 MED ORDER — ONDANSETRON HCL 4 MG/2ML IJ SOLN
INTRAMUSCULAR | Status: AC
  Filled 2024-12-08: qty 2

## 2024-12-08 MED ORDER — ASPIRIN 81 MG PO CHEW
81.0000 mg | CHEWABLE_TABLET | Freq: Two times a day (BID) | ORAL | Status: DC
  Administered 2024-12-08 – 2024-12-09 (×2): 81 mg via ORAL
  Filled 2024-12-08 (×2): qty 1

## 2024-12-08 MED ORDER — ACETAMINOPHEN 325 MG PO TABS: 325.0000 mg | ORAL_TABLET | Freq: Four times a day (QID) | ORAL | Status: DC | PRN

## 2024-12-08 MED ORDER — ONDANSETRON HCL 4 MG PO TABS: 4.0000 mg | ORAL_TABLET | Freq: Four times a day (QID) | ORAL | Status: DC | PRN

## 2024-12-08 SURGICAL SUPPLY — 48 items
BAG COUNTER SPONGE SURGICOUNT (BAG) ×1 IMPLANT
BLADE SAW SAG 90X13X1.27 (BLADE) ×1 IMPLANT
BLADE SAW SGTL 13.0X1.19X90.0M (BLADE) ×1 IMPLANT
BNDG COHESIVE 6X5 TAN ST LF (GAUZE/BANDAGES/DRESSINGS) ×2 IMPLANT
BOWL SMART MIX CTS (DISPOSABLE) IMPLANT
COOLER ICEMAN CLASSIC (MISCELLANEOUS) IMPLANT
COVER BACK TABLE 24X17X13 BIG (DRAPES) IMPLANT
COVER SURGICAL LIGHT HANDLE (MISCELLANEOUS) ×2 IMPLANT
CUFF TOURN SGL QUICK 42 (TOURNIQUET CUFF) IMPLANT
CUFF TRNQT CYL 34X4.125X (TOURNIQUET CUFF) IMPLANT
DRAPE HALF SHEET 40X57 (DRAPES) ×2 IMPLANT
DRAPE IMP U-DRAPE 54X76 (DRAPES) ×1 IMPLANT
DRAPE U-SHAPE 47X51 STRL (DRAPES) ×1 IMPLANT
DRSG ADAPTIC 3X8 NADH LF (GAUZE/BANDAGES/DRESSINGS) ×1 IMPLANT
DRSG AQUACEL AG ADV 3.5X14 (GAUZE/BANDAGES/DRESSINGS) IMPLANT
DURAPREP 26ML APPLICATOR (WOUND CARE) ×1 IMPLANT
ELECTRODE REM PT RTRN 9FT ADLT (ELECTROSURGICAL) ×1 IMPLANT
FACESHIELD WRAPAROUND OR TEAM (MASK) ×1 IMPLANT
GAUZE PAD ABD 8X10 STRL (GAUZE/BANDAGES/DRESSINGS) ×1 IMPLANT
GAUZE SPONGE 4X4 12PLY STRL (GAUZE/BANDAGES/DRESSINGS) ×1 IMPLANT
GLOVE BIOGEL PI IND STRL 9 (GLOVE) ×1 IMPLANT
GLOVE BIOGEL PI ORTHO SZ9 (GLOVE) ×1 IMPLANT
GOWN STRL REUS W/ TWL XL LVL3 (GOWN DISPOSABLE) ×3 IMPLANT
KIT BASIN OR (CUSTOM PROCEDURE TRAY) ×1 IMPLANT
KIT TURNOVER KIT B (KITS) ×1 IMPLANT
LINER TIB POST TRIATH 7X16 (Liner) IMPLANT
MANIFOLD NEPTUNE II (INSTRUMENTS) ×1 IMPLANT
NDL SPNL 18GX3.5 QUINCKE PK (NEEDLE) ×1 IMPLANT
NEEDLE SPNL 18GX3.5 QUINCKE PK (NEEDLE) ×1 IMPLANT
PACK TOTAL JOINT (CUSTOM PROCEDURE TRAY) ×1 IMPLANT
PACK UNIVERSAL I (CUSTOM PROCEDURE TRAY) ×1 IMPLANT
PAD ARMBOARD POSITIONER FOAM (MISCELLANEOUS) ×2 IMPLANT
PAD COLD SHLDR WRAP-ON (PAD) IMPLANT
PADDING CAST COTTON 6X4 STRL (CAST SUPPLIES) ×1 IMPLANT
SET HNDPC FAN SPRY TIP SCT (DISPOSABLE) IMPLANT
SOLN 0.9% NACL POUR BTL 1000ML (IV SOLUTION) ×1 IMPLANT
SOLN STERILE WATER BTL 1000 ML (IV SOLUTION) ×1 IMPLANT
STAPLER SKIN PROX 35W (STAPLE) ×1 IMPLANT
SUCTION TUBE FRAZIER 10FR DISP (SUCTIONS) IMPLANT
SUT VIC AB 0 CT1 27XBRD ANBCTR (SUTURE) IMPLANT
SUT VIC AB 1 CTX36XBRD ANBCTR (SUTURE) IMPLANT
SUT VIC AB 2-0 CTB1 (SUTURE) IMPLANT
SWAB CULTURE ESWAB REG 1ML (MISCELLANEOUS) ×1 IMPLANT
TOWEL GREEN STERILE (TOWEL DISPOSABLE) ×1 IMPLANT
TOWEL GREEN STERILE FF (TOWEL DISPOSABLE) ×1 IMPLANT
TRAY FOLEY MTR SLVR 16FR STAT (SET/KITS/TRAYS/PACK) IMPLANT
WRAP KNEE MAXI GEL POST OP (GAUZE/BANDAGES/DRESSINGS) ×1 IMPLANT
triathlon tibial bearing 7x16 IMPLANT

## 2024-12-08 NOTE — Anesthesia Procedure Notes (Signed)
 Anesthesia Regional Block: Adductor canal block   Pre-Anesthetic Checklist: , timeout performed,  Correct Patient, Correct Site, Correct Laterality,  Correct Procedure, Correct Position, site marked,  Risks and benefits discussed,  Surgical consent,  Pre-op evaluation,  At surgeon's request and post-op pain management  Laterality: Lower and Left  Prep: chloraprep       Needles:  Injection technique: Single-shot  Needle Type: Echogenic Needle     Needle Length: 9cm  Needle Gauge: 21     Additional Needles:   Procedures:,,,, ultrasound used (permanent image in chart),,    Narrative:  Start time: 12/08/2024 7:58 AM End time: 12/08/2024 8:00 AM Injection made incrementally with aspirations every 5 mL.  Performed by: Personally  Anesthesiologist: Boone Fess, MD  Additional Notes: Patient's chart reviewed and they were deemed appropriate candidate for procedure, per surgeon's request. Patient educated about risks, benefits, and alternatives of the block including but not limited to: temporary or permanent nerve damage, bleeding, infection, damage to surround tissues, block failure, local anesthetic toxicity. Patient expressed understanding. A formal time-out was conducted consistent with institution rules.  Monitors were applied, and minimal sedation used (see nursing record). The site was prepped with skin prep and allowed to dry, and sterile gloves were used. A high frequency linear ultrasound probe with probe cover was utilized throughout. Femoral artery visualized at mid-thigh level, local anesthetic injected anterolateral to it, and echogenic block needle trajectory was monitored throughout. Hydrodissection of saphenous nerve visualized and appeared anatomically normal. Aspiration performed every 5ml. Blood vessels were avoided. All injections were performed without resistance and free of blood and paresthesias. The patient tolerated the procedure well.  Injectate: 20ml 0.25%  bupivacaine 

## 2024-12-08 NOTE — Transfer of Care (Signed)
 Immediate Anesthesia Transfer of Care Note  Patient: Benjamin Romero  Procedure(s) Performed: TOTAL KNEE REVISION (Left: Knee)  Patient Location: PACU  Anesthesia Type:Regional and Spinal  Level of Consciousness: awake and alert   Airway & Oxygen Therapy: Patient Spontanous Breathing and Patient connected to nasal cannula oxygen  Post-op Assessment: Report given to RN and Post -op Vital signs reviewed and stable  Post vital signs: Reviewed and stable  Last Vitals:  Vitals Value Taken Time  BP 125/79 12/08/24 09:35  Temp 36.7 C 12/08/24 09:35  Pulse 70 12/08/24 09:37  Resp 16 12/08/24 09:37  SpO2 93 % 12/08/24 09:37  Vitals shown include unfiled device data.  Last Pain:  Vitals:   12/08/24 0549  TempSrc: Oral         Complications: No notable events documented.

## 2024-12-08 NOTE — Anesthesia Postprocedure Evaluation (Signed)
"   Anesthesia Post Note  Patient: Benjamin Romero  Procedure(s) Performed: TOTAL KNEE REVISION (Left: Knee)     Patient location during evaluation: PACU Anesthesia Type: Spinal Level of consciousness: oriented and awake and alert Pain management: pain level controlled Vital Signs Assessment: post-procedure vital signs reviewed and stable Respiratory status: spontaneous breathing, respiratory function stable and patient connected to nasal cannula oxygen Cardiovascular status: blood pressure returned to baseline and stable Postop Assessment: no headache, no backache and no apparent nausea or vomiting Anesthetic complications: no   No notable events documented.  Last Vitals:  Vitals:   12/08/24 0935 12/08/24 0945  BP: 125/79 126/75  Pulse: 71 67  Resp: 13 13  Temp: 36.7 C   SpO2: 93% 94%    Last Pain:  Vitals:   12/08/24 0935  TempSrc:   PainSc: Asleep                 Rome Ade      "

## 2024-12-08 NOTE — Op Note (Signed)
 12/08/2024  9:34 AM  PATIENT:  Benjamin Romero    PRE-OPERATIVE DIAGNOSIS:  Loose Left Total Knee Arthroplasty  POST-OPERATIVE DIAGNOSIS:  Same  PROCEDURE:  TOTAL KNEE REVISION of the tibial component  SURGEON:  Jerona LULLA Sage, MD  PHYSICIAN ASSISTANT:None ANESTHESIA:   General  PREOPERATIVE INDICATIONS:  [name] is a  73 y.o. male with a diagnosis of Loose Left Total Knee Arthroplasty who failed conservative measures and elected for surgical management.    The risks benefits and alternatives were discussed with the patient preoperatively including but not limited to the risks of infection, bleeding, nerve injury, cardiopulmonary complications, the need for revision surgery, among others, and the patient was willing to proceed.  OPERATIVE IMPLANTS:   Implant Name Type Inv. Item Serial No. Manufacturer Lot No. LRB No. Used Action  INSERT TIBIAL BEARING SZ 7X11 - [id number] Insert INSERT TIBIAL BEARING SZ 7X11  [employer] [id number] Left 1 Explanted  Primary tibial baseplate    [employer] [id number] Left 1 Explanted  triathlon tibial bearing 7x16    [employer] [id number] Left 1 Implanted    @ENCIMAGES @  OPERATIVE FINDINGS: Patient had synovitis around the patella and this was resected there was also multiple fragments of polyethylene within the joint this was removed.  OPERATIVE PROCEDURE: Patient is brought the operating room and underwent spinal anesthetic.  After adequate levels anesthesia were obtained patient's left lower extremity was prepped using DuraPrep draped into a sterile field a timeout was called.  His previous midline incision was used this was carried down to a medial parapatellar incision.  The polyethylene tray was removed.  There was multiple fragments of the polyethylene that was removed there were also significant amount of polyethylene wear.  The wound was irrigated with Vashe x 2.  Synovitis around the tray was resected.  Synovial tissues was  sent for cultures there was no clinical signs of infection.  The existing polyethylene tray was a 13 mm.  This was tried with a 14 mm tray and had improved stability.  Decision was made to proceed with a 16 mm tray.  The 16 mm tray was placed this had improved stability the patella tracked midline.  The knee was again irrigated with Vashe.  The retinaculum was closed using #1 Vicryl subcu was closed using 0 Vicryl and the skin was closed using staples.  A Aquacel dressing was applied patient was taken the PACU in stable condition.   DISCHARGE PLANNING:  Antibiotic duration: Antibiotics 24 hours  Weightbearing: Weightbearing as tolerated  Pain medication: Opioid pathway  Dressing care/ Wound VAC: Dry dressing  Ambulatory devices: Walker or crutches  Discharge to: Anticipate observation with discharge to home in the morning.  Follow-up: In the office 1 week post operative.

## 2024-12-08 NOTE — Anesthesia Procedure Notes (Signed)
 Spinal  Patient location during procedure: OR Start time: 12/08/2024 8:18 AM End time: 12/08/2024 8:21 AM Reason for block: surgical anesthesia  Staffing Performed: anesthesiologist  Authorized by: Boone Fess, MD   Performed by: Boone Fess, MD  Preanesthetic Checklist Completed: patient identified, IV checked, site marked, risks and benefits discussed, surgical consent, monitors and equipment checked, pre-op evaluation and timeout performed Spinal Block Patient position: sitting Prep: ChloraPrep and site prepped and draped Patient monitoring: heart rate, continuous pulse ox, blood pressure and cardiac monitor Approach: midline Location: L3-4 Injection technique: single-shot Needle Needle type: Quincke  Needle gauge: 22 G Needle length: 9 cm Assessment Sensory level: T10 Events: CSF return  Additional Notes Meticulous sterile technique used throughout (CHG prep, sterile gloves, sterile drape). Negative paresthesia. Negative blood return. Positive free-flowing CSF. Expiration date of kit checked and confirmed. Patient tolerated procedure well, without complications.

## 2024-12-08 NOTE — Interval H&P Note (Signed)
 History and Physical Interval Note:  12/08/2024 6:40 AM  Benjamin Romero  has presented today for surgery, with the diagnosis of Loose Left Total Knee Arthroplasty.  The various methods of treatment have been discussed with the patient and family. After consideration of risks, benefits and other options for treatment, the patient has consented to  Procedures with comments: TOTAL KNEE REVISION (Left) - REVISION POLY TRAY LEFT TOTAL KNEE ARTHROPLASTY as a surgical intervention.  The patient's history has been reviewed, patient examined, no change in status, stable for surgery.  I have reviewed the patient's chart and labs.  Questions were answered to the patient's satisfaction.     Hunner Garcon V Janiyha Montufar

## 2024-12-09 MED ORDER — OXYCODONE-ACETAMINOPHEN 5-325 MG PO TABS: 1.0000 | ORAL_TABLET | ORAL | 0 refills | Status: DC | PRN

## 2024-12-09 NOTE — Discharge Instructions (Signed)

## 2024-12-09 NOTE — Discharge Summary (Signed)
 Physician Discharge Summary  Patient ID: Benjamin Romero MRN: [id number] DOB/AGE: 1951/06/19 73 y.o.  Admit date: 12/08/2024 Discharge date: 12/09/2024  Admission Diagnoses:  Principal Problem:   Failed total knee arthroplasty Active Problems:   Failed total knee, left, initial encounter   Discharge Diagnoses:  Same  Past Medical History:  Diagnosis Date   Colon polyps    Diverticulosis    E-coli UTI 11/02/2012   1 month ago- due to diet reducing med used-all clear   Gas    Hypertension    Pancreatitis    ~ 20212, 01/2017 (idiopathic)   Prediabetes    Sleep apnea    denies-no cpap used,dx. 2009    Surgeries: Procedures: TOTAL KNEE REVISION on 12/08/2024   Consultants:   Discharged Condition: Improved  Hospital Course: Benjamin Romero is an 73 y.o. male who was admitted 12/08/2024 with a chief complaint of No chief complaint on file. , and found to have a diagnosis of Failed total knee arthroplasty.  They were brought to the operating room on 12/08/2024 and underwent the above named procedures.    They were given perioperative antibiotics:  Anti-infectives (From admission, onward)    Start     Dose/Rate Route Frequency Ordered Stop   12/08/24 1400  ceFAZolin  (ANCEF ) IVPB 2g/100 mL premix        2 g 200 mL/hr over 30 Minutes Intravenous Every 6 hours 12/08/24 1147 12/08/24 2115   12/08/24 0600  ceFAZolin  (ANCEF ) IVPB 2g/100 mL premix        2 g 200 mL/hr over 30 Minutes Intravenous On call to O.R. 12/08/24 0548 12/08/24 0830     .  They were given compression stockings, early ambulation, and chemoprophylaxis for DVT prophylaxis.  They benefited maximally from their hospital stay and there were no complications.    Recent vital signs:  Vitals:   12/09/24 0322 12/09/24 0800  BP: 135/66 125/72  Pulse: (!) 57 (!) 55  Resp: 16 16  Temp: 97.6 F (36.4 C) (!) 97.4 F (36.3 C)  SpO2: 93% 92%    Recent laboratory studies:  Results for orders placed or  performed during the hospital encounter of 12/08/24  Aerobic/Anaerobic Culture w Gram Stain (surgical/deep wound)   Collection Time: 12/08/24  8:47 AM   Specimen: Path Tissue  Result Value Ref Range   Specimen Description TISSUE    Special Requests SYNOVIAL TISSUE LEFT KNEE    Gram Stain      NO WBC SEEN NO ORGANISMS SEEN Performed at [facility], [address], [place], [place] [address]    Culture PENDING    Report Status PENDING     Discharge Medications:   Allergies as of 12/09/2024   No Known Allergies      Medication List     TAKE these medications    ascorbic acid  1000 MG tablet Commonly known as: VITAMIN C  Take 1,000 mg by mouth daily.   B-100 B-COMPLEX PO Take 1 tablet by mouth daily.   hydrochlorothiazide  25 MG tablet Commonly known as: HYDRODIURIL  Take 1 tablet (25 mg total) by mouth daily.   mupirocin  ointment 2 % Commonly known as: BACTROBAN  Apply 1 Application topically 2 (two) times daily.   oxyCODONE -acetaminophen  5-325 MG tablet Commonly known as: PERCOCET/ROXICET Take 1 tablet by mouth every 4 (four) hours as needed.   PROSTATE HEALTH PO Take 1 tablet by mouth daily.        Diagnostic Studies: CT KNEE LEFT WO CONTRAST Result Date:  11/17/2024 EXAM: CT LEFT KNEE WITHOUT IV CONTRAST 11/10/2024 02:09:11 PM TECHNIQUE: Axial images were acquired through the left knee without IV contrast. Reformatted images were reviewed. Automated exposure control, iterative reconstruction, and/or weight based adjustment of the mA/kV was utilized to reduce the radiation dose to as low as reasonably achievable. COMPARISON: Radiographs 10/30/2024. CLINICAL HISTORY: Chronic knee pain and knee replacement. FINDINGS: BONES: Total knee prosthesis observed with resurfaced patellar component. No periprosthetic fracture or appreciable abnormal lucency along the bone-methacrylate or methacrylate-metal interface. Polymer component along the tibial tray is well  positioned. JOINTS: No dislocation. Small knee joint effusion in the suprapatellar bursa region. Several clustered ossific fragments are present in the expected location of a Baker cyst, the largest individual component 1.8 cm in long axis. These could reflect osteochondral fragments loose within a surrounding Baker cyst. SOFT TISSUES: The soft tissues are unremarkable. IMPRESSION: 1. Total knee prosthesis without periprosthetic fracture or abnormal lucency. 2. Clustered ossific fragments in the expected location of a Baker cyst, possibly representing osteochondral fragments loose within a surrounding Baker cyst; largest fragment measures 1.8 cm. 3. Small knee joint effusion in the suprapatellar bursa region. Electronically signed by: [name] Salvage MD 11/17/2024 11:08 AM EST RP Workstation: [id number]    Disposition: Discharge disposition: 01-Home or Self Care       Discharge Instructions     Call MD / Call 911   Complete by: As directed    If you experience chest pain or shortness of breath, CALL 911 and be transported to the hospital emergency room.  If you develope a fever above 101 F, pus (white drainage) or increased drainage or redness at the wound, or calf pain, call your surgeon's office.   Constipation Prevention   Complete by: As directed    Drink plenty of fluids.  Prune juice may be helpful.  You may use a stool softener, such as Colace (over the counter) 100 mg twice a day.  Use MiraLax  (over the counter) for constipation as needed.   Increase activity slowly as tolerated   Complete by: As directed    Post-operative opioid taper instructions:   Complete by: As directed    POST-OPERATIVE OPIOID TAPER INSTRUCTIONS: It is important to wean off of your opioid medication as soon as possible. If you do not need pain medication after your surgery it is ok to stop day one. Opioids include: Codeine, Hydrocodone (Norco, Vicodin), Oxycodone (Percocet, oxycontin ) and hydromorphone  amongst  others.  Long term and even short term use of opiods can cause: Increased pain response Dependence Constipation Depression Respiratory depression And more.  Withdrawal symptoms can include Flu like symptoms Nausea, vomiting And more Techniques to manage these symptoms Hydrate well Eat regular healthy meals Stay active Use relaxation techniques(deep breathing, meditating, yoga) Do Not substitute Alcohol to help with tapering If you have been on opioids for less than two weeks and do not have pain than it is ok to stop all together.  Plan to wean off of opioids This plan should start within one week post op of your joint replacement. Maintain the same interval or time between taking each dose and first decrease the dose.  Cut the total daily intake of opioids by one tablet each day Next start to increase the time between doses. The last dose that should be eliminated is the evening dose.           Follow-up Information     Harden Jerona GAILS, MD Follow up in 1 week(s).   Specialty: Orthopedic  Surgery Contact information: [address] [place] [address] [phone]                  Signed: Jerona LULLA Sage 12/09/2024, 9:15 AM

## 2024-12-09 NOTE — Progress Notes (Signed)
"   Patient discharged via private transportation to home, IV access removed, no DME needs, AVS reviewed at bedside.  "

## 2024-12-09 NOTE — Progress Notes (Signed)
 Patient ID: Benjamin Romero, male   DOB: 02-Feb-1951, 73 y.o.   MRN: [id number] Patient is postoperative day 1 revision tibial polyethylene.  Patient has full active extension without extensor lag.  Stat Gram stain shows no white cells no organisms seen in the synovial tissue.  Plan for discharge to home today prescription for Percocet.  Weightbearing as tolerated he will use his crutches for stability.

## 2024-12-09 NOTE — Evaluation (Signed)
 Physical Therapy Evaluation and Discharge Patient Details Name: Benjamin Romero MRN: [id number] DOB: October 19, 1951 Today's Date: 12/09/2024  History of Present Illness  73 y.o. male admitted 12/08/24 for L TK revision PMH-HTN, pancreatitis, prediabetes, sleep apnea, L TKA  Clinical Impression  Patient evaluated by Physical Therapy with no further acute PT needs identified. All education has been completed and the patient has no further questions. Patient mobilized modified independent with RW. Educated on HEP and handout provided. PT is signing off. Thank you for this referral.         If plan is discharge home, recommend the following:     Can travel by private vehicle        Equipment Recommendations None recommended by PT  Recommendations for Other Services       Functional Status Assessment Patient has had a recent decline in their functional status and demonstrates the ability to make significant improvements in function in a reasonable and predictable amount of time.     Precautions / Restrictions Precautions Precautions: Fall Recall of Precautions/Restrictions: Intact Restrictions Weight Bearing Restrictions Per Provider Order: Yes LLE Weight Bearing Per Provider Order: Weight bearing as tolerated      Mobility  Bed Mobility Overal bed mobility: Modified Independent             General bed mobility comments: with rails    Transfers Overall transfer level: Modified independent Equipment used: Rolling walker (2 wheels)                    Ambulation/Gait Ambulation/Gait assistance: Modified independent (Device/Increase time) Gait Distance (Feet): 40 Feet Assistive device: Rolling walker (2 wheels) Gait Pattern/deviations: Step-through pattern, Antalgic       General Gait Details: distance limited by pain (pt had called for pain medication)  Stairs Stairs:  (NA)          Wheelchair Mobility     Tilt Bed    Modified Rankin (Stroke  Patients Only)       Balance Overall balance assessment: Modified Independent                                           Pertinent Vitals/Pain Pain Assessment Pain Assessment: 0-10 Pain Score: 10-Worst pain ever Pain Location: L knee Pain Descriptors / Indicators: Constant, Operative site guarding Pain Intervention(s): Limited activity within patient's tolerance, Monitored during session, Patient requesting pain meds-RN notified    Home Living Family/patient expects to be discharged to:: Private residence Living Arrangements: Spouse/significant other;Other relatives (son, DIL) Available Help at Discharge: Family;Available 24 hours/day Type of Home: House Home Access: Level entry       Home Layout: One level Home Equipment: Grab bars - tub/shower;Shower Counsellor (2 wheels);Crutches      Prior Function Prior Level of Function : Independent/Modified Independent;Driving             Mobility Comments: has been using crutches ADLs Comments: sometimes assist with socks     Extremity/Trunk Assessment   Upper Extremity Assessment Upper Extremity Assessment: Overall WFL for tasks assessed    Lower Extremity Assessment Lower Extremity Assessment: LLE deficits/detail LLE Deficits / Details: flexion limited to 60 degrees; full extension; 2+ quads    Cervical / Trunk Assessment Cervical / Trunk Assessment: Normal  Communication   Communication Communication: No apparent difficulties    Cognition Arousal: Alert Behavior During Therapy: [facility]  for tasks assessed/performed   PT - Cognitive impairments: No apparent impairments                         Following commands: Intact       Cueing Cueing Techniques: Verbal cues     General Comments      Exercises Total Joint Exercises Ankle Circles/Pumps: AROM, Both, 5 reps Quad Sets: AROM, Both, 5 reps Heel Slides: AROM, Left, 5 reps Hip ABduction/ADduction: AAROM, Left, 5  reps Long Arc Quad: AROM, Left, 5 reps   Assessment/Plan    PT Assessment All further PT needs can be met in the next venue of care  PT Problem List Decreased strength;Decreased range of motion;Decreased activity tolerance;Decreased mobility;Pain       PT Treatment Interventions      PT Goals (Current goals can be found in the Care Plan section)  Acute Rehab PT Goals Patient Stated Goal: go home today PT Goal Formulation: All assessment and education complete, DC therapy    Frequency       Co-evaluation               AM-PAC PT 6 Clicks Mobility  Outcome Measure Help needed turning from your back to your side while in a flat bed without using bedrails?: None Help needed moving from lying on your back to sitting on the side of a flat bed without using bedrails?: None Help needed moving to and from a bed to a chair (including a wheelchair)?: None Help needed standing up from a chair using your arms (e.g., wheelchair or bedside chair)?: None Help needed to walk in hospital room?: None Help needed climbing 3-5 steps with a railing? : A Little 6 Click Score: 23    End of Session   Activity Tolerance: Patient limited by pain Patient left: in bed;with call bell/phone within reach;with family/visitor present Nurse Communication: Mobility status;Patient requests pain meds;Other (comment) (Ok to dc from PT perspective) PT Visit Diagnosis: Other abnormalities of gait and mobility (R26.89);Pain Pain - Right/Left: Left Pain - part of body: Knee    Time: 9055-8988 PT Time Calculation (min) (ACUTE ONLY): 27 min   Charges:   PT Evaluation $PT Eval Low Complexity: 1 Low PT Treatments $Therapeutic Exercise: 8-22 mins PT General Charges $$ ACUTE PT VISIT: 1 Visit          Macario RAMAN, PT Acute Rehabilitation Services  Office [phone]   Macario SHAUNNA Soja 12/09/2024, 10:15 AM

## 2024-12-09 NOTE — Plan of Care (Signed)
" °  Problem: Clinical Measurements: Goal: Ability to maintain clinical measurements within normal limits will improve Outcome: Progressing Goal: Will remain free from infection Outcome: Progressing Goal: Respiratory complications will improve Outcome: Progressing Goal: Cardiovascular complication will be avoided Outcome: Progressing   Problem: Activity: Goal: Risk for activity intolerance will decrease Outcome: Progressing   Problem: Coping: Goal: Level of anxiety will decrease Outcome: Progressing   Problem: Elimination: Goal: Will not experience complications related to bowel motility Outcome: Progressing Goal: Will not experience complications related to urinary retention Outcome: Progressing   Problem: Pain Managment: Goal: General experience of comfort will improve and/or be controlled Outcome: Progressing   Problem: Safety: Goal: Ability to remain free from injury will improve Outcome: Progressing   Problem: Skin Integrity: Goal: Risk for impaired skin integrity will decrease Outcome: Progressing   Problem: Education: Goal: Knowledge of the prescribed therapeutic regimen will improve Outcome: Progressing Goal: Individualized Educational Video(s) Outcome: Progressing   Problem: Activity: Goal: Ability to avoid complications of mobility impairment will improve Outcome: Progressing Goal: Range of joint motion will improve Outcome: Progressing   Problem: Pain Management: Goal: Pain level will decrease with appropriate interventions Outcome: Progressing   Problem: Skin Integrity: Goal: Will show signs of wound healing Outcome: Progressing   "

## 2024-12-11 ENCOUNTER — Encounter (HOSPITAL_COMMUNITY): Payer: Self-pay | Admitting: Orthopedic Surgery

## 2024-12-12 ENCOUNTER — Telehealth: Payer: Self-pay

## 2024-12-12 ENCOUNTER — Encounter (HOSPITAL_COMMUNITY): Payer: Self-pay | Admitting: Orthopedic Surgery

## 2024-12-12 NOTE — Telephone Encounter (Signed)
 I called and sw PT and advised verbal ok for order below the pt is s/p knee surgery on 12/08/2024

## 2024-12-12 NOTE — Telephone Encounter (Signed)
 Physical therapist with [facility] would like orders for 3 x week for 2 weeks for HHPT.  CB# [phone].  Please advise.  Thank you.

## 2024-12-13 LAB — AEROBIC/ANAEROBIC CULTURE W GRAM STAIN (SURGICAL/DEEP WOUND)
Culture: NO GROWTH
Gram Stain: NONE SEEN

## 2024-12-22 ENCOUNTER — Ambulatory Visit: Admitting: Family

## 2024-12-22 ENCOUNTER — Other Ambulatory Visit: Payer: Self-pay

## 2024-12-22 ENCOUNTER — Telehealth: Payer: Self-pay | Admitting: *Deleted

## 2024-12-22 DIAGNOSIS — I10 Essential (primary) hypertension: Secondary | ICD-10-CM

## 2024-12-22 DIAGNOSIS — G8929 Other chronic pain: Secondary | ICD-10-CM

## 2024-12-22 DIAGNOSIS — M25562 Pain in left knee: Secondary | ICD-10-CM

## 2024-12-22 MED ORDER — OXYCODONE-ACETAMINOPHEN 5-325 MG PO TABS: 1.0000 | ORAL_TABLET | ORAL | 0 refills | Status: DC | PRN

## 2024-12-22 MED ORDER — METHOCARBAMOL 500 MG PO TABS: 500.0000 mg | ORAL_TABLET | Freq: Four times a day (QID) | ORAL | 0 refills | Status: DC | PRN

## 2024-12-22 MED ORDER — HYDROCHLOROTHIAZIDE 25 MG PO TABS: 25.0000 mg | ORAL_TABLET | Freq: Every day | ORAL | 1 refills | Status: AC

## 2024-12-22 NOTE — Telephone Encounter (Signed)
 Seen in office today for patient's 2 week post op. Doing very well with Flexion/extension. He requested additional therapy visits with [facility]. Verbal orders provided for continuation of HHPT x 2 additional weeks. Patient aware.

## 2024-12-22 NOTE — Progress Notes (Signed)
 "  Post-Op Visit Note   Patient: Benjamin Romero           Date of Birth: [birth year]           MRN: [id number] Visit Date: 12/22/2024 PCP: Berneta Elsie Sayre, MD  Chief Complaint:  Chief Complaint  Patient presents with   Left Knee - Routine Post Op    12/08/2024 left total knee revision  10/2012 left total knee replacement 12/2013 left poly exchange      HPI:  HPI The patient is a 74 year old gentleman who is seen status post revision left total knee arthroplasty December 08, 2024 has been doing well has been working with home health physical therapy has home health PT has been extended for 2 more weeks at home.  He reports he has been having some difficulty with muscle spasms especially in the thigh  Overall feels well. does use 2 crutches Right Knee Exam   Range of Motion  Flexion:  110      On examination left knee staples are in place this is healing well there is mild to moderate edema no erythema excellent range of motion with full extension  Visit Diagnoses:  1. Chronic pain of left knee   2. Essential hypertension     Plan: Will leave 5 remaining staples he will follow-up in 1 week for remaining staple removal continue with PT refill of pain medication and prescription for muscle relaxer sent  Follow-Up Instructions: No follow-ups on file.   Imaging: No results found.  Orders:  Orders Placed This Encounter  Procedures   XR Knee 1-2 Views Left   Meds ordered this encounter  Medications   oxyCODONE -acetaminophen  (PERCOCET/ROXICET) 5-325 MG tablet    Sig: Take 1 tablet by mouth every 4 (four) hours as needed.    Dispense:  30 tablet    Refill:  0   methocarbamol  (ROBAXIN ) 500 MG tablet    Sig: Take 1 tablet (500 mg total) by mouth every 6 (six) hours as needed for muscle spasms.    Dispense:  30 tablet    Refill:  0   hydrochlorothiazide  (HYDRODIURIL ) 25 MG tablet    Sig: Take 1 tablet (25 mg total) by mouth daily.    Dispense:  90 tablet     Refill:  1     PMFS History: Patient Active Problem List   Diagnosis Date Noted   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   Screening for colon cancer 03/28/2024   Elevated uric acid in blood 03/28/2024   Dysthymia 03/28/2024   Prediabetes 03/28/2024   Elevated PSA 03/28/2024   Medication side effect 07/26/2023   Grieving 07/26/2023   History of pancreatitis 07/07/2021   Essential hypertension 07/07/2021   Pain in right knee 05/27/2020   Hyperlipidemia 05/10/2019   BPH (benign prostatic hyperplasia) 02/13/2017   Benign prostatic hyperplasia with urinary frequency 06/07/2014   Urinary tract infection, site not specified    Benign localized hyperplasia of prostate with urinary obstruction and other lower urinary tract symptoms (LUTS)(600.21)    Personal history of tobacco use, presenting hazards to health    Effusion of left knee joint 01/05/2014   Failed total knee, left, initial encounter 01/05/2014   Arthritis of knee, left 11/03/2012   Nonspecific (abnormal) findings on radiological and other examination of gastrointestinal tract 12/31/2011   Gastritis and gastroduodenitis 12/31/2011   Duodenitis 12/31/2011   History of colonic polyps 11/10/2011   RESTLESS LEG SYNDROME 03/01/2008   Obesity, morbid (HCC) 02/09/2008  Acute pancreatitis 01/30/2008   Past Medical History:  Diagnosis Date   Colon polyps    Diverticulosis    E-coli UTI 11/02/2012   1 month ago- due to diet reducing med used-all clear   Gas    Hypertension    Pancreatitis    ~ 20212, 01/2017 (idiopathic)   Prediabetes    Sleep apnea    denies-no cpap used,dx. 2009    Family History  Problem Relation Age of Onset   Thyroid  cancer Mother     Past Surgical History:  Procedure Laterality Date   BACK SURGERY     lumbar   COLONOSCOPY     EUS  12/31/2011   Procedure: UPPER ENDOSCOPIC ULTRASOUND (EUS) LINEAR;  Surgeon: Toribio Cedar, MD;  Location: [facility];  Service: Endoscopy;  Laterality: N/A;   radial linear   HAND SURGERY Left    repair traum cut left hand   KNEE SURGERY  bil   TOTAL KNEE ARTHROPLASTY  11/03/2012   Procedure: TOTAL KNEE ARTHROPLASTY;  Surgeon: Lonni CINDERELLA Poli, MD;  Location: [facility];  Service: Orthopedics;  Laterality: Left;  Left Total Knee Arthroplasty   TOTAL KNEE REVISION Left 01/05/2014   Procedure: LEFT TOTAL KNEE POLY EXCHANGE;  Surgeon: Lonni CINDERELLA Poli, MD;  Location: [facility];  Service: Orthopedics;  Laterality: Left;   TOTAL KNEE REVISION Left 12/08/2024   Procedure: TOTAL KNEE REVISION;  Surgeon: Harden Jerona GAILS, MD;  Location: [facility];  Service: Orthopedics;  Laterality: Left;  REVISION POLY TRAY LEFT TOTAL KNEE ARTHROPLASTY   Social History   Occupational History   Not on file  Tobacco Use   Smoking status: Every Day    Current packs/day: 0.00    Average packs/day: 2.0 packs/day    Types: Cigarettes    Last attempt to quit: 12/21/2022    Years since quitting: 2.0   Smokeless tobacco: Never  Vaping Use   Vaping status: Never Used  Substance and Sexual Activity   Alcohol use: Yes    Comment: pt states 1 beer 4 months ago   Drug use: No   Sexual activity: Yes    "

## 2024-12-27 ENCOUNTER — Encounter (HOSPITAL_COMMUNITY): Payer: Self-pay | Admitting: Orthopedic Surgery

## 2024-12-29 ENCOUNTER — Encounter: Admitting: Family

## 2025-01-02 ENCOUNTER — Telehealth: Payer: Self-pay | Admitting: Orthopedic Surgery

## 2025-01-02 ENCOUNTER — Other Ambulatory Visit: Payer: Self-pay | Admitting: Orthopedic Surgery

## 2025-01-02 ENCOUNTER — Telehealth: Payer: Self-pay

## 2025-01-02 MED ORDER — OXYCODONE-ACETAMINOPHEN 5-325 MG PO TABS: 1.0000 | ORAL_TABLET | ORAL | 0 refills | Status: DC | PRN

## 2025-01-02 MED ORDER — METHOCARBAMOL 500 MG PO TABS: 500.0000 mg | ORAL_TABLET | Freq: Four times a day (QID) | ORAL | 0 refills | Status: DC | PRN

## 2025-01-02 NOTE — Telephone Encounter (Signed)
 Pt called wanting a refill of his pain medication and muscle relaxer. Pharmacy is [facility] on [address]. Call back number is [phone].

## 2025-01-02 NOTE — Telephone Encounter (Signed)
 This is a duplicate message. Already sent to MD.

## 2025-01-02 NOTE — Telephone Encounter (Signed)
 Autumn, pt's dtr, states that pt is requesting one last refills for his muscle relaxer and pain medication as he finishes up with physical therapy.

## 2025-01-03 ENCOUNTER — Ambulatory Visit: Admitting: Family

## 2025-01-03 ENCOUNTER — Encounter (HOSPITAL_COMMUNITY): Payer: Self-pay | Admitting: Orthopedic Surgery

## 2025-01-03 ENCOUNTER — Other Ambulatory Visit (INDEPENDENT_AMBULATORY_CARE_PROVIDER_SITE_OTHER)

## 2025-01-03 DIAGNOSIS — Z96659 Presence of unspecified artificial knee joint: Secondary | ICD-10-CM

## 2025-01-03 DIAGNOSIS — M1612 Unilateral primary osteoarthritis, left hip: Secondary | ICD-10-CM

## 2025-01-03 DIAGNOSIS — M25552 Pain in left hip: Secondary | ICD-10-CM

## 2025-01-03 DIAGNOSIS — M5416 Radiculopathy, lumbar region: Secondary | ICD-10-CM

## 2025-01-03 DIAGNOSIS — T84018D Broken internal joint prosthesis, other site, subsequent encounter: Secondary | ICD-10-CM

## 2025-01-03 MED ORDER — PREDNISONE 50 MG PO TABS: ORAL_TABLET | ORAL | 0 refills | Status: DC

## 2025-01-03 NOTE — Progress Notes (Signed)
 "  Office Visit Note   Patient: Benjamin Romero           Date of Birth: 12/05/1951           MRN: [id number] Visit Date: 01/03/2025              Requested by: Berneta Elsie Sayre, MD [address] [place],  [place] [address] PCP: Berneta Elsie Sayre, MD  Chief Complaint  Patient presents with   Left Knee - Routine Post Op    12/08/2024 left total knee revision  10/2012 left total knee replacement 12/2013 left poly exchange       Left Hip - Pain      HPI: The patient is a 74 year old gentleman who presents status post left total knee arthroplasty revision December 19 of this year.  He continues with aggressive home health physical therapy as well as a therapist guided home health exercise program he is quite frustrated with his perceived lack of progress.  He is full weightbearing with a straight cane he feels he has not regained strength in the left thigh as quickly as he hoped he feels that he can palpate a defect in the muscle from the knee to the hip laterally overall just frustrated with lack of strength on the left  Having significant pain on the left which radiates from the lateral hip down to the knee this is described as stabbing shooting pain he also has some anterior knee pain which radiates from the patellar tendon up to the mid quad area.  He describes new plantar foot pain on the left, this is unilateral and new since surgery  History of lumbar spine intervention but this was over 20 years ago.  He has not followed with a spine specialist in decades  Assessment & Plan: Visit Diagnoses:  1. Pain in left hip     Plan: OA left hip however symptoms coincide with L4-L5 lumbar radiculopathy pain pattern.  Will place on prednisone  burst for the next 5 days if he fails to improve consider referral for epidural steroid injection versus intra-articular hip injection on the left.  Follow-Up Instructions: No follow-ups on file.   Left Knee Exam   Muscle  Strength  The patient has normal left knee strength.  Range of Motion  The patient has normal left knee ROM.  Other  Swelling: mild Effusion: no effusion present   Left Hip Exam   Tenderness  The patient is experiencing no tenderness.   Range of Motion  The patient has normal left hip ROM.  Comments:  External rotation painful correction   Back Exam   Tenderness  The patient is experiencing no tenderness.   Muscle Strength  The patient has normal back strength.  Tests  Straight leg raise right: negative Straight leg raise left: negative      Patient is alert, oriented, no adenopathy, well-dressed, normal affect, normal respiratory effort. On examination left medial anterior knee incision is well-healed staples have been harvested there is no erythema   Imaging: No results found. No images are attached to the encounter.  Labs: Lab Results  Component Value Date   HGBA1C 6.3 03/13/2024   LABURIC 5.7 03/29/2024   LABURIC 6.6 08/24/2023   REPTSTATUS 12/13/2024 FINAL 12/08/2024   GRAMSTAIN NO WBC SEEN NO ORGANISMS SEEN  12/08/2024   CULT  12/08/2024    No growth aerobically or anaerobically. Performed at [facility], [address], [place], [place] [address]  LABORGA ESCHERICHIA COLI (A) 12/30/2021     Lab Results  Component Value Date   ALBUMIN 4.3 03/13/2024   ALBUMIN 4.0 01/19/2022   ALBUMIN 4.5 07/07/2021    No results found for: MG No results found for: VD25OH  No results found for: PREALBUMIN    Latest Ref Rng & Units 12/05/2024   10:12 AM 03/13/2024   11:16 AM 01/19/2022    4:22 PM  CBC EXTENDED  WBC 4.0 - 10.5 K/uL 10.8  11.9  12.5   RBC 4.22 - 5.81 MIL/uL 5.54  5.54  4.83   Hemoglobin 13.0 - 17.0 g/dL 82.5  82.6  85.2   HCT 39.0 - 52.0 % 50.5  51.0  43.0   Platelets 150 - 400 K/uL 293  250.0  293   NEUT# 1.4 - 7.7 K/uL  7.1    Lymph# 0.7 - 4.0 K/uL  3.3       There is no height or weight on file to calculate  BMI.  Orders:  Orders Placed This Encounter  Procedures   XR HIP UNILAT W OR W/O PELVIS 2-3 VIEWS LEFT   No orders of the defined types were placed in this encounter.    Procedures: No procedures performed  Clinical Data: No additional findings.  ROS:  All other systems negative, except as noted in the HPI. Review of Systems  Objective: Vital Signs: There were no vitals taken for this visit.  Specialty Comments:  No specialty comments available.  PMFS History: Patient Active Problem List   Diagnosis Date Noted   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   Screening for colon cancer 03/28/2024   Elevated uric acid in blood 03/28/2024   Dysthymia 03/28/2024   Prediabetes 03/28/2024   Elevated PSA 03/28/2024   Medication side effect 07/26/2023   Grieving 07/26/2023   History of pancreatitis 07/07/2021   Essential hypertension 07/07/2021   Pain in right knee 05/27/2020   Hyperlipidemia 05/10/2019   BPH (benign prostatic hyperplasia) 02/13/2017   Benign prostatic hyperplasia with urinary frequency 06/07/2014   Urinary tract infection, site not specified    Benign localized hyperplasia of prostate with urinary obstruction and other lower urinary tract symptoms (LUTS)(600.21)    Personal history of tobacco use, presenting hazards to health    Effusion of left knee joint 01/05/2014   Failed total knee, left, initial encounter 01/05/2014   Arthritis of knee, left 11/03/2012   Nonspecific (abnormal) findings on radiological and other examination of gastrointestinal tract 12/31/2011   Gastritis and gastroduodenitis 12/31/2011   Duodenitis 12/31/2011   History of colonic polyps 11/10/2011   RESTLESS LEG SYNDROME 03/01/2008   Obesity, morbid (HCC) 02/09/2008   Acute pancreatitis 01/30/2008   Past Medical History:  Diagnosis Date   Colon polyps    Diverticulosis    E-coli UTI 11/02/2012   1 month ago- due to diet reducing med used-all clear   Gas    Hypertension     Pancreatitis    ~ 20212, 01/2017 (idiopathic)   Prediabetes    Sleep apnea    denies-no cpap used,dx. 2009    Family History  Problem Relation Age of Onset   Thyroid  cancer Mother     Past Surgical History:  Procedure Laterality Date   BACK SURGERY     lumbar   COLONOSCOPY     EUS  12/31/2011   Procedure: UPPER ENDOSCOPIC ULTRASOUND (EUS) LINEAR;  Surgeon: Toribio Cedar, MD;  Location: [facility];  Service: Endoscopy;  Laterality: N/A;  radial  linear   HAND SURGERY Left    repair traum cut left hand   KNEE SURGERY  bil   TOTAL KNEE ARTHROPLASTY  11/03/2012   Procedure: TOTAL KNEE ARTHROPLASTY;  Surgeon: Lonni CINDERELLA Poli, MD;  Location: [facility];  Service: Orthopedics;  Laterality: Left;  Left Total Knee Arthroplasty   TOTAL KNEE REVISION Left 01/05/2014   Procedure: LEFT TOTAL KNEE POLY EXCHANGE;  Surgeon: Lonni CINDERELLA Poli, MD;  Location: [facility];  Service: Orthopedics;  Laterality: Left;   TOTAL KNEE REVISION Left 12/08/2024   Procedure: TOTAL KNEE REVISION;  Surgeon: Harden Jerona GAILS, MD;  Location: [facility] OR;  Service: Orthopedics;  Laterality: Left;  REVISION POLY TRAY LEFT TOTAL KNEE ARTHROPLASTY   Social History   Occupational History   Not on file  Tobacco Use   Smoking status: Every Day    Current packs/day: 0.00    Average packs/day: 2.0 packs/day    Types: Cigarettes    Last attempt to quit: 12/21/2022    Years since quitting: 2.0   Smokeless tobacco: Never  Vaping Use   Vaping status: Never Used  Substance and Sexual Activity   Alcohol use: Yes    Comment: pt states 1 beer 4 months ago   Drug use: No   Sexual activity: Yes       "

## 2025-01-04 ENCOUNTER — Telehealth: Payer: Self-pay

## 2025-01-04 ENCOUNTER — Telehealth: Payer: Self-pay | Admitting: *Deleted

## 2025-01-04 ENCOUNTER — Other Ambulatory Visit: Payer: Self-pay | Admitting: *Deleted

## 2025-01-04 DIAGNOSIS — M1712 Unilateral primary osteoarthritis, left knee: Secondary | ICD-10-CM

## 2025-01-04 DIAGNOSIS — M25462 Effusion, left knee: Secondary | ICD-10-CM

## 2025-01-04 DIAGNOSIS — T84018A Broken internal joint prosthesis, other site, initial encounter: Secondary | ICD-10-CM

## 2025-01-04 NOTE — Telephone Encounter (Signed)
 Terry with [facility] would like orders to continue PT 6 x week for 3 weeks.  Did advise that patient was seen in the office on 01/03/25.  CB# [phone].  Please advise.  Thank you.

## 2025-01-04 NOTE — Telephone Encounter (Signed)
 Will hold message. Benjamin Romero is calling patient this afternoon to see if we can get him into out patient PT. She believes this will be a better option for him at this time into his recovery.

## 2025-01-04 NOTE — Telephone Encounter (Signed)
 Sheri stated that she will call [facility] in reference to visits OR if he wants to do out pt PT.

## 2025-01-04 NOTE — Telephone Encounter (Signed)
 Spoke with patient regarding therapy and status. He is having a lot of pain and weakness still in the left leg/thigh area after Left total knee revision. Explained this continues to be normal at this point. Discovered he is not taking pain medication hardly at all and explained how this will help with rest and working with PT. He is doing very well and has been participating in HHPT since d/c home. Having pain in hip he says, but as he is describing, sounds mainly like it may be coming from the back. NP yesterday put him on a steroid -1 tablet daily x 5 days, but got slightly confused and took 2 yesterday. Slept good last night and shooting pain into thigh felt better this morning. Discussed with Dr. Harden and he agreed that change of venue with therapy may do wonders for his confidence and provide some getting out of the house time. Called therapist with [facility] and provided information and agreed that he will make today's visit a discharge visit. OPPT ordered and scheduled for Tuesday, 01/09/25 at 11:45 in our office. Patient aware and agreeable.

## 2025-01-04 NOTE — Telephone Encounter (Signed)
 Jerel, PT with [facility] called stating patient mixed his prednisone  with his Oxycodone  and now he is missing 2 pills for the prednisone .  CB#[phone].  Please advise.  Thank you.

## 2025-01-09 ENCOUNTER — Ambulatory Visit: Admitting: Orthopedic Surgery

## 2025-01-09 ENCOUNTER — Encounter: Payer: Self-pay | Admitting: Orthopedic Surgery

## 2025-01-09 ENCOUNTER — Ambulatory Visit: Admitting: Physical Therapy

## 2025-01-09 DIAGNOSIS — R2689 Other abnormalities of gait and mobility: Secondary | ICD-10-CM

## 2025-01-09 DIAGNOSIS — M6281 Muscle weakness (generalized): Secondary | ICD-10-CM | POA: Diagnosis not present

## 2025-01-09 DIAGNOSIS — R2681 Unsteadiness on feet: Secondary | ICD-10-CM

## 2025-01-09 DIAGNOSIS — R29898 Other symptoms and signs involving the musculoskeletal system: Secondary | ICD-10-CM

## 2025-01-09 DIAGNOSIS — M25552 Pain in left hip: Secondary | ICD-10-CM

## 2025-01-09 DIAGNOSIS — R600 Localized edema: Secondary | ICD-10-CM | POA: Diagnosis not present

## 2025-01-09 DIAGNOSIS — M5410 Radiculopathy, site unspecified: Secondary | ICD-10-CM

## 2025-01-09 DIAGNOSIS — M25562 Pain in left knee: Secondary | ICD-10-CM

## 2025-01-09 MED ORDER — OXYCODONE-ACETAMINOPHEN 5-325 MG PO TABS: 1.0000 | ORAL_TABLET | ORAL | 0 refills | Status: AC | PRN

## 2025-01-09 NOTE — Progress Notes (Signed)
 "  Office Visit Note   Patient: Benjamin Romero           Date of Birth: 1951/09/30           MRN: [id number] Visit Date: 01/09/2025              Requested by: Berneta Elsie Sayre, MD [address] [place],  [place] [address] PCP: Berneta Elsie Sayre, MD  Chief Complaint  Patient presents with   Left Knee - Routine Post Op    12/08/2024 left total knee revision  10/2012 left total knee replacement 12/2013 left poly exchange       HPI: Discussed the use of AI scribe software for clinical note transcription with the patient, who gave verbal consent to proceed.  History of Present Illness   Patient is seen in follow-up status post revision left total knee arthroplasty.  Patient states he has weakness when trying to stand on the left leg.  He states he has had 2 episodes of falling due to the weakness of the left leg.  He states he has pain which radiates from the left buttocks down to the knee along the quad muscle and also has numbness on the dorsum of his left foot.  Assessment & Plan: Visit Diagnoses:  1. Radicular pain of left lower extremity   2. Weakness of left hip     Plan: Assessment and Plan Assessment & Plan  Assessment: #1 stable left total knee revision.  #2 left-sided radicular pain with weakness of the hip flexors and stability due to the weakness and 2 recent falls.  Plan: Will obtain MRI scan of the lumbar spine to see if patient would benefit from a transforaminal epidural injection.  Cleansed     Follow-Up Instructions: No follow-ups on file.   Ortho Exam  Patient is alert, oriented, no adenopathy, well-dressed, normal affect, normal respiratory effort. Physical Exam    Examination with patient standing on his left leg he has weakness and instability.  He has a negative straight leg raise on the left however he has significant hip flexor weakness on the left compared to the right.  Review of the radiographs of the hip show some mild  arthritic changes in the left hip but no significant arthritic changes.  Review of his lumbar spine shows advanced degenerative disc disease with collapse throughout the lower lumbar spine with large osteophytic bone spurs .Examination of the left knee shows excellent range of motion with a stable total knee without effusion without extensor lag.  Skin  Imaging: No results found. No images are attached to the encounter.  Labs: Lab Results  Component Value Date   HGBA1C 6.3 03/13/2024   LABURIC 5.7 03/29/2024   LABURIC 6.6 08/24/2023   REPTSTATUS 12/13/2024 FINAL 12/08/2024   GRAMSTAIN NO WBC SEEN NO ORGANISMS SEEN  12/08/2024   CULT  12/08/2024    No growth aerobically or anaerobically. Performed at [facility], [address] [place], [place] [address]    LABORGA ESCHERICHIA COLI (A) 12/30/2021     Lab Results  Component Value Date   ALBUMIN 4.3 03/13/2024   ALBUMIN 4.0 01/19/2022   ALBUMIN 4.5 07/07/2021    No results found for: MG No results found for: VD25OH  No results found for: PREALBUMIN    Latest Ref Rng & Units 12/05/2024   10:12 AM 03/13/2024   11:16 AM 01/19/2022    4:22 PM  CBC EXTENDED  WBC 4.0 - 10.5 K/uL 10.8  11.9  12.5   RBC 4.22 - 5.81 MIL/uL 5.54  5.54  4.83   Hemoglobin 13.0 - 17.0 g/dL 82.5  82.6  85.2   HCT 39.0 - 52.0 % 50.5  51.0  43.0   Platelets 150 - 400 K/uL 293  250.0  293   NEUT# 1.4 - 7.7 K/uL  7.1    Lymph# 0.7 - 4.0 K/uL  3.3       There is no height or weight on file to calculate BMI.  Orders:  No orders of the defined types were placed in this encounter.  No orders of the defined types were placed in this encounter.    Procedures: No procedures performed  Clinical Data: No additional findings.  ROS:  All other systems negative, except as noted in the HPI. Review of Systems  Objective: Vital Signs: There were no vitals taken for this visit.  Specialty Comments:  No specialty comments  available.  PMFS History: Patient Active Problem List   Diagnosis Date Noted   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   Screening for colon cancer 03/28/2024   Elevated uric acid in blood 03/28/2024   Dysthymia 03/28/2024   Prediabetes 03/28/2024   Elevated PSA 03/28/2024   Medication side effect 07/26/2023   Grieving 07/26/2023   History of pancreatitis 07/07/2021   Essential hypertension 07/07/2021   Pain in right knee 05/27/2020   Hyperlipidemia 05/10/2019   BPH (benign prostatic hyperplasia) 02/13/2017   Benign prostatic hyperplasia with urinary frequency 06/07/2014   Urinary tract infection, site not specified    Benign localized hyperplasia of prostate with urinary obstruction and other lower urinary tract symptoms (LUTS)(600.21)    Personal history of tobacco use, presenting hazards to health    Effusion of left knee joint 01/05/2014   Failed total knee, left, initial encounter 01/05/2014   Arthritis of knee, left 11/03/2012   Nonspecific (abnormal) findings on radiological and other examination of gastrointestinal tract 12/31/2011   Gastritis and gastroduodenitis 12/31/2011   Duodenitis 12/31/2011   History of colonic polyps 11/10/2011   RESTLESS LEG SYNDROME 03/01/2008   Obesity, morbid (HCC) 02/09/2008   Acute pancreatitis 01/30/2008   Past Medical History:  Diagnosis Date   Colon polyps    Diverticulosis    E-coli UTI 11/02/2012   1 month ago- due to diet reducing med used-all clear   Gas    Hypertension    Pancreatitis    ~ 20212, 01/2017 (idiopathic)   Prediabetes    Sleep apnea    denies-no cpap used,dx. 2009    Family History  Problem Relation Age of Onset   Thyroid  cancer Mother     Past Surgical History:  Procedure Laterality Date   BACK SURGERY     lumbar   COLONOSCOPY     EUS  12/31/2011   Procedure: UPPER ENDOSCOPIC ULTRASOUND (EUS) LINEAR;  Surgeon: Toribio Cedar, MD;  Location: [facility];  Service: Endoscopy;  Laterality: N/A;  radial  linear   HAND SURGERY Left    repair traum cut left hand   KNEE SURGERY  bil   TOTAL KNEE ARTHROPLASTY  11/03/2012   Procedure: TOTAL KNEE ARTHROPLASTY;  Surgeon: Lonni CINDERELLA Poli, MD;  Location: [facility];  Service: Orthopedics;  Laterality: Left;  Left Total Knee Arthroplasty   TOTAL KNEE REVISION Left 01/05/2014   Procedure: LEFT TOTAL KNEE POLY EXCHANGE;  Surgeon: Lonni CINDERELLA Poli, MD;  Location: [facility];  Service: Orthopedics;  Laterality: Left;   TOTAL KNEE REVISION Left 12/08/2024  Procedure: TOTAL KNEE REVISION;  Surgeon: Harden Jerona GAILS, MD;  Location: [facility] OR;  Service: Orthopedics;  Laterality: Left;  REVISION POLY TRAY LEFT TOTAL KNEE ARTHROPLASTY   Social History   Occupational History   Not on file  Tobacco Use   Smoking status: Every Day    Current packs/day: 0.00    Average packs/day: 2.0 packs/day    Types: Cigarettes    Last attempt to quit: 12/21/2022    Years since quitting: 2.0   Smokeless tobacco: Never  Vaping Use   Vaping status: Never Used  Substance and Sexual Activity   Alcohol use: Yes    Comment: pt states 1 beer 4 months ago   Drug use: No   Sexual activity: Yes         "

## 2025-01-09 NOTE — Therapy (Cosign Needed)
 " OUTPATIENT PHYSICAL THERAPY LOWER EXTREMITY EVALUATION   Patient Name: Benjamin Romero MRN: [id number] DOB:23-Jun-1951, 74 y.o., male Today's Date: 01/10/2025  END OF SESSION:  PT End of Session - 01/09/25 1228     Visit Number 1    Number of Visits 12    Date for Recertification  02/20/25    Authorization Type [employer]    Authorization Time Period $20 COPAY    Progress Note Due on Visit 10    PT Start Time 1145    PT Stop Time 1228    PT Time Calculation (min) 43 min    Activity Tolerance Patient tolerated treatment well    Behavior During Therapy [facility] for tasks assessed/performed          Past Medical History:  Diagnosis Date   Colon polyps    Diverticulosis    E-coli UTI 11/02/2012   1 month ago- due to diet reducing med used-all clear   Gas    Hypertension    Pancreatitis    ~ 20212, 01/2017 (idiopathic)   Prediabetes    Sleep apnea    denies-no cpap used,dx. 2009   Past Surgical History:  Procedure Laterality Date   BACK SURGERY     lumbar   COLONOSCOPY     EUS  12/31/2011   Procedure: UPPER ENDOSCOPIC ULTRASOUND (EUS) LINEAR;  Surgeon: Toribio Cedar, MD;  Location: [facility];  Service: Endoscopy;  Laterality: N/A;  radial linear   HAND SURGERY Left    repair traum cut left hand   KNEE SURGERY  bil   TOTAL KNEE ARTHROPLASTY  11/03/2012   Procedure: TOTAL KNEE ARTHROPLASTY;  Surgeon: Lonni CINDERELLA Poli, MD;  Location: [facility];  Service: Orthopedics;  Laterality: Left;  Left Total Knee Arthroplasty   TOTAL KNEE REVISION Left 01/05/2014   Procedure: LEFT TOTAL KNEE POLY EXCHANGE;  Surgeon: Lonni CINDERELLA Poli, MD;  Location: [facility];  Service: Orthopedics;  Laterality: Left;   TOTAL KNEE REVISION Left 12/08/2024   Procedure: TOTAL KNEE REVISION;  Surgeon: Harden Jerona GAILS, MD;  Location: [facility] OR;  Service: Orthopedics;  Laterality: Left;  REVISION POLY TRAY LEFT TOTAL KNEE ARTHROPLASTY   Patient Active Problem List   Diagnosis Date Noted   Failed total knee  arthroplasty 12/08/2024   Screening for colon cancer 03/28/2024   Elevated uric acid in blood 03/28/2024   Dysthymia 03/28/2024   Prediabetes 03/28/2024   Elevated PSA 03/28/2024   Medication side effect 07/26/2023   Grieving 07/26/2023   History of pancreatitis 07/07/2021   Essential hypertension 07/07/2021   Pain in right knee 05/27/2020   Hyperlipidemia 05/10/2019   BPH (benign prostatic hyperplasia) 02/13/2017   Benign prostatic hyperplasia with urinary frequency 06/07/2014   Urinary tract infection, site not specified    Benign localized hyperplasia of prostate with urinary obstruction and other lower urinary tract symptoms (LUTS)(600.21)    Personal history of tobacco use, presenting hazards to health    Effusion of left knee joint 01/05/2014   Failed total knee, left, initial encounter 01/05/2014   Arthritis of knee, left 11/03/2012   Nonspecific (abnormal) findings on radiological and other examination of gastrointestinal tract 12/31/2011   Gastritis and gastroduodenitis 12/31/2011   Duodenitis 12/31/2011   History of colonic polyps 11/10/2011   RESTLESS LEG SYNDROME 03/01/2008   Obesity, morbid (HCC) 02/09/2008   Acute pancreatitis 01/30/2008    PCP: Berneta Elsie Sayre, MD   REFERRING PROVIDER: Harden Jerona GAILS, MD   REFERRING DIAG:  [phone] (ICD-10-CM) -  Arthritis of knee, left  M25.462 (ICD-10-CM) - Effusion of left knee joint  T84.018A,Z96.659 (ICD-10-CM) - Failure of total knee replacement, initial encounter    THERAPY DIAG:  Muscle weakness (generalized) - Plan: PT plan of care cert/re-cert  Acute pain of left knee - Plan: PT plan of care cert/re-cert  Unsteadiness on feet - Plan: PT plan of care cert/re-cert  Other abnormalities of gait and mobility - Plan: PT plan of care cert/re-cert  Pain in left hip - Plan: PT plan of care cert/re-cert  Localized edema - Plan: PT plan of care cert/re-cert  Rationale for Evaluation and Treatment:  Rehabilitation  ONSET DATE: Date of surgery 11/03/2012  Date of Revision 12/08/24  SUBJECTIVE:   SUBJECTIVE STATEMENT: Patient states that he has not been able to sleep at night due to pain and discomfort. He is sleeping for about 2 hours at night. He was receiving HHPT and demonstrated exercises he was previously doing at home. He also has a bike at home that he enjoys using for about 15 minutes. He also mentioned shooting pain into the left hip after his Left TKA revision.   PERTINENT HISTORY: Left TKA Revision 12/08/24 Left poly exchange 12/2013 Left TKA 11/03/2012 Left hip pain   DIAGNOSTIC FINDINGS:  EXAM: CT LEFT KNEE WITHOUT IV CONTRAST 11/10/2024 02:09:11 PM IMPRESSION: 1. Total knee prosthesis without periprosthetic fracture or abnormal lucency. 2. Clustered ossific fragments in the expected location of a Baker cyst, possibly representing osteochondral fragments loose within a surrounding Baker cyst; largest fragment measures 1.8 cm. 3. Small knee joint effusion in the suprapatellar bursa region.  PAIN:  NPRS scale: At initial evaluation 8/10 Pain location: Anterior knee/patella and up into the Lt hip; some numbness down into bottom of foot Pain description: Numbness and tingling in left foot; Sharp pain in knee Aggravating factors: exercising, standing for long periods of time  Relieving factors: tennis ball under foot, ice, medications, OTC (ibuprofen)   PRECAUTIONS: None  WEIGHT BEARING RESTRICTIONS: No  FALLS:  Has patient fallen in last 6 months? Yes. 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 10 represents able to perform at prior level. 0 1 2 3 4 5 6 7 8 9  10 (Date and Score)  Activity Eval     1. Walking long distances 4     2. Standing long periods of time 5     3.     4.    5.    Score 4.5    Total score = sum of the activity scores/number of activities Minimum detectable change (90%CI) for average score = 2 points Minimum detectable change (90%CI) for single activity score = 3 points  COGNITION: Overall cognitive status: WFL    SENSATION: WFL  POSTURE:  rounded shoulders and flexed trunk   PALPATION: Tenderness to quad tendon and proximal knee incision; possible scar tissue  buildup that is sensitive to sensation; mild edema noted in Lt knee  LOWER EXTREMITY ROM:   ROM Right eval Left eval  Hip flexion    Hip extension    Hip abduction    Hip adduction    Hip internal rotation    Hip external rotation    Knee flexion  A; 134  Knee extension  A: -4 (seated LAQ) P: 0  Ankle dorsiflexion    Ankle plantarflexion    Ankle inversion    Ankle eversion     (Blank rows = not tested)  LOWER EXTREMITY MMT:  MMT Right eval Left eval  Hip flexion    Hip extension    Hip abduction    Hip adduction    Hip internal rotation    Hip external rotation    Knee flexion    Knee extension  3-/5  Ankle dorsiflexion    Ankle plantarflexion    Ankle inversion    Ankle eversion     (Blank rows = not tested)  FUNCTIONAL TESTS:  5 Times Sit to Stand from standard chair (patient did not cross arms at chest and pushed off of legs to stand): 13.78 sec Lt SLS: unable to perform  Rt SLS: 4.6 sec   GAIT: Distance walked: 30 ft Assistive device utilized: Single point cane Level of assistance:  SBA Comments: Patient ambulated with an antalgic gait pattern characterized by decreased Lt stance time, mild limp, flexed trunk posture, slight R lateral lean (decreased weight shift to Lt in standing as well), and rounded shoulders.                                                                                                                                                                        TODAY'S TREATMENT                        DATE: 01/09/25 Therapeutic Exercise: HEP instruction/performance c cues for techniques, handout provided.  Trial set performed of each for comprehension and symptom assessment.  See below for exercise list  PATIENT EDUCATION:  Education details: HEP, POC Person educated: Patient Education method: Explanation, Demonstration, Verbal cues, and Handouts Education comprehension: verbalized understanding, returned demonstration, and verbal cues required  HOME EXERCISE PROGRAM: Access Code: [email or web address] URL: [email or web address] Date: 01/09/2025 Prepared by: Corean Ku Exercises - Seated Active Straight-Leg Raise  - 3 x daily - 7 x weekly - 3 sets - 10 reps - 3 hold  - Sit to Stand with Arms Crossed  - 3 x daily - 7 x weekly - 3 sets - 10 reps  - Standing Lumbar Extension  - 3 x daily - 7 x weekly - 3 sets - 10 reps  -  Seated Knee Extension with Anchored Resistance  - 3 x daily - 7 x weekly - 3 sets - 10 reps    ASSESSMENT:  CLINICAL IMPRESSION: Patient is a 74 y.o. who comes to clinic s/p a Left TKA Revision with complaints of pain with mobility, strength and movement coordination deficits that impair their ability to perform usual daily and recreational functional activities without increase difficulty/symptoms at this time.  Patient to benefit from skilled PT services to address impairments and limitations to improve to previous level of function without restriction secondary to condition.   OBJECTIVE IMPAIRMENTS: Abnormal gait,  decreased activity tolerance, decreased balance, decreased coordination, decreased endurance, decreased knowledge of condition, decreased mobility, difficulty walking, decreased strength, and pain.   ACTIVITY LIMITATIONS: carrying, lifting, bending, sitting, standing, squatting, sleeping, stairs, transfers, and locomotion level  PARTICIPATION LIMITATIONS: driving, community activity, and occupation  PERSONAL FACTORS: Past/current experiences, Time since onset of injury/illness/exacerbation, and 1-2 comorbidities: Left TKA revision, Left poly exchange are also affecting patient's functional outcome.   REHAB POTENTIAL: Good  CLINICAL DECISION MAKING: Stable/uncomplicated  EVALUATION COMPLEXITY: Low   GOALS: Goals reviewed with patient? Yes  SHORT TERM GOALS: (target date for Short term goals 02/02/25)   1.  Patient will demonstrate independent use of home exercise program to maintain progress from in clinic treatments.  Goal status: New  LONG TERM GOALS: (target dates for all long term goals 02/20/2025)   1. Patient will demonstrate/report pain at worst less than or equal to 2/10 to facilitate minimal limitation in daily activity secondary to pain symptoms. Goal status: New   2. Patient will demonstrate independent use of home exercise program to facilitate ability to maintain/progress functional gains from skilled physical therapy services. Goal status: New   3. Patient will demonstrate Patient specific functional scale avg > or = 6 to indicate reduced disability due to condition.  Goal status: New   4.  Patient will demonstrate Rt LE MMT 4/5 throughout to faciltiate usual transfers, stairs, squatting at [facility] for daily life.  Goal status: New   5.  Patient will complete 5xSTS in < or = 12 seconds with arms across chest and supervision to demonstrate improved LE strength and transfer efficiency to support community mobility.  Goal status: New  PLAN:  PT FREQUENCY:  1-2x/week  PT DURATION: 6 weeks  PLANNED INTERVENTIONS: 97164- PT Re-evaluation, 97750- Physical Performance Testing, 97110-Therapeutic exercises, 97530- Therapeutic activity, [id number]- Neuromuscular re-education, 97535- Self Care, 02859- Manual therapy, [phone]- Gait training, [phone]- Aquatic Therapy, [phone]- Electrical stimulation (unattended), 97016- Vasopneumatic device, 20560 (1-2 muscles), 20561 (3+ muscles)- Dry Needling, Patient/Family education, Balance training, Stair training, Taping, Joint mobilization, Scar mobilization, Cryotherapy, and Moist heat  PLAN FOR NEXT SESSION: Follow up about HEP. Functional strengthening and balance, working on LLE weight bearing  Next MD visit: tbd  Sherline Babe, Student-PT 01/09/2025, 12:41 PM  I have reviewed and concur with this student's documentation.  Read, reviewed, edited and agree with student's findings and recommendations.  During this treatment session, the therapist was present, participating in and directing the treatment.    Corean JULIANNA Ku, PT, DPT 01/10/25 7:54 AM    Referring diagnosis?  M17.12 (ICD-10-CM) - Arthritis of knee, left  M25.462 (ICD-10-CM) - Effusion of left knee joint  T84.018A,Z96.659 (ICD-10-CM) - Failure of total knee replacement, initial encounter   Treatment diagnosis? (if different than referring diagnosis)  Muscle weakness (generalized) Acute pain of left knee Unsteadiness on feet Pain in left hip Other abnormalities of gait and mobility  What was this (referring dx) caused by? [x]  Surgery []  Fall []  Ongoing issue []  Arthritis []  Other: ____________  Laterality: []  Rt [x]  Lt []  Both  Check all possible CPT codes: *CHOOSE 10 OR LESS* See Planned Interventions listed in the Plan section of the Evaluation.     "

## 2025-01-09 NOTE — Addendum Note (Signed)
 Addended by: HARDEN LAME on: 01/09/2025 01:28 PM   Modules accepted: Orders

## 2025-01-12 ENCOUNTER — Ambulatory Visit

## 2025-01-12 DIAGNOSIS — M25552 Pain in left hip: Secondary | ICD-10-CM | POA: Diagnosis not present

## 2025-01-12 DIAGNOSIS — M25562 Pain in left knee: Secondary | ICD-10-CM

## 2025-01-12 DIAGNOSIS — R2689 Other abnormalities of gait and mobility: Secondary | ICD-10-CM

## 2025-01-12 DIAGNOSIS — R2681 Unsteadiness on feet: Secondary | ICD-10-CM | POA: Diagnosis not present

## 2025-01-12 DIAGNOSIS — R600 Localized edema: Secondary | ICD-10-CM | POA: Diagnosis not present

## 2025-01-12 DIAGNOSIS — M6281 Muscle weakness (generalized): Secondary | ICD-10-CM | POA: Diagnosis not present

## 2025-01-12 NOTE — Therapy (Addendum)
 " OUTPATIENT PHYSICAL THERAPY LOWER EXTREMITY TREATMENT/DISCHARGE  PHYSICAL THERAPY DISCHARGE SUMMARY  Visits from Start of Care: 2  Current functional level related to goals / functional outcomes: Spoke with Benjamin Romero 01/25/2025.  He feels comfortable with doing things on his own.   Remaining deficits: See note   Education / Equipment: HEP   Patient agrees to discharge. Patient goals were established. Patient is being discharged due to being pleased with the current functional level.   Patient Name: Benjamin Romero MRN: [id number] DOB:Jul 26, 1951, 74 y.o., male Today's Date: 01/25/2025  END OF SESSION:     Past Medical History:  Diagnosis Date   Colon polyps    Diverticulosis    E-coli UTI 11/02/2012   1 month ago- due to diet reducing med used-all clear   Gas    Hypertension    Pancreatitis    ~ 20212, 01/2017 (idiopathic)   Prediabetes    Sleep apnea    denies-no cpap used,dx. 2009   Past Surgical History:  Procedure Laterality Date   BACK SURGERY     lumbar   COLONOSCOPY     EUS  12/31/2011   Procedure: UPPER ENDOSCOPIC ULTRASOUND (EUS) LINEAR;  Surgeon: Toribio Cedar, MD;  Location: [facility];  Service: Endoscopy;  Laterality: N/A;  radial linear   HAND SURGERY Left    repair traum cut left hand   KNEE SURGERY  bil   TOTAL KNEE ARTHROPLASTY  11/03/2012   Procedure: TOTAL KNEE ARTHROPLASTY;  Surgeon: Lonni CINDERELLA Poli, MD;  Location: [facility];  Service: Orthopedics;  Laterality: Left;  Left Total Knee Arthroplasty   TOTAL KNEE REVISION Left 01/05/2014   Procedure: LEFT TOTAL KNEE POLY EXCHANGE;  Surgeon: Lonni CINDERELLA Poli, MD;  Location: [facility];  Service: Orthopedics;  Laterality: Left;   TOTAL KNEE REVISION Left 12/08/2024   Procedure: TOTAL KNEE REVISION;  Surgeon: Harden Jerona GAILS, MD;  Location: [facility] OR;  Service: Orthopedics;  Laterality: Left;  REVISION POLY TRAY LEFT TOTAL KNEE ARTHROPLASTY   Patient Active Problem List   Diagnosis Date Noted   Failed  total knee arthroplasty 12/08/2024   Screening for colon cancer 03/28/2024   Elevated uric acid in blood 03/28/2024   Dysthymia 03/28/2024   Prediabetes 03/28/2024   Elevated PSA 03/28/2024   Medication side effect 07/26/2023   Grieving 07/26/2023   History of pancreatitis 07/07/2021   Essential hypertension 07/07/2021   Pain in right knee 05/27/2020   Hyperlipidemia 05/10/2019   BPH (benign prostatic hyperplasia) 02/13/2017   Benign prostatic hyperplasia with urinary frequency 06/07/2014   Urinary tract infection, site not specified    Benign localized hyperplasia of prostate with urinary obstruction and other lower urinary tract symptoms (LUTS)(600.21)    Personal history of tobacco use, presenting hazards to health    Effusion of left knee joint 01/05/2014   Failed total knee, left, initial encounter 01/05/2014   Arthritis of knee, left 11/03/2012   Nonspecific (abnormal) findings on radiological and other examination of gastrointestinal tract 12/31/2011   Gastritis and gastroduodenitis 12/31/2011   Duodenitis 12/31/2011   History of colonic polyps 11/10/2011   RESTLESS LEG SYNDROME 03/01/2008   Obesity, morbid (HCC) 02/09/2008   Acute pancreatitis 01/30/2008    PCP: Berneta Elsie Sayre, MD   REFERRING PROVIDER: Harden Jerona GAILS, MD   REFERRING DIAG:  [phone] (ICD-10-CM) - Arthritis of knee, left  M25.462 (ICD-10-CM) - Effusion of left knee joint  T84.018A,Z96.659 (ICD-10-CM) - Failure of total knee replacement, initial encounter    THERAPY  DIAG:  Acute pain of left knee  Muscle weakness (generalized)  Unsteadiness on feet  Other abnormalities of gait and mobility  Localized edema  Pain in left hip  Rationale for Evaluation and Treatment: Rehabilitation  ONSET DATE: Date of surgery 11/03/2012  Date of Revision 12/08/24  SUBJECTIVE:   SUBJECTIVE STATEMENT: Patient reports that he is working out day and night because he can't sleep.  Patient reports  that his knee only really hurts at a 3/10 when he is walking on it. Patient also reports that his sleep has improved.  PERTINENT HISTORY: Left TKA Revision 12/08/24 Left poly exchange 12/2013 Left TKA 11/03/2012 Left hip pain   Patient states that he has not been able to sleep at night due to pain and discomfort. He is sleeping for about 2 hours at night. He was receiving HHPT and demonstrated exercises he was previously doing at home. He also has a bike at home that he enjoys using for about 15 minutes. He also mentioned shooting pain into the left hip after his Left TKA revision.   DIAGNOSTIC FINDINGS:  EXAM: CT LEFT KNEE WITHOUT IV CONTRAST 11/10/2024 02:09:11 PM IMPRESSION: 1. Total knee prosthesis without periprosthetic fracture or abnormal lucency. 2. Clustered ossific fragments in the expected location of a Baker cyst, possibly representing osteochondral fragments loose within a surrounding Baker cyst; largest fragment measures 1.8 cm. 3. Small knee joint effusion in the suprapatellar bursa region.  PAIN:  NPRS scale: 3/10 at beginning of session Pain location: Anterior knee/patella and up into the Lt hip; some numbness down into bottom of foot Pain description: Numbness and tingling in left foot; Sharp pain in knee Aggravating factors: exercising, standing for long periods of time  Relieving factors: tennis ball under foot, ice, medications, OTC (ibuprofen)   PRECAUTIONS: None  WEIGHT BEARING RESTRICTIONS: No  FALLS:  Has patient fallen in last 6 months? Yes. 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 10 represents able to perform at prior level. 0 1 2 3 4 5 6 7 8 9  10 (Date and Score)  Activity Eval     1. Walking long distances 4     2. Standing long periods of time 5     3.     4.    5.    Score 4.5    Total score = sum of the activity scores/number of activities Minimum detectable change (90%CI) for average score = 2 points Minimum detectable change (90%CI) for single activity score = 3 points  COGNITION: Overall cognitive status: WFL    SENSATION: WFL  POSTURE:  rounded shoulders and flexed trunk   PALPATION: Tenderness to quad tendon and proximal knee incision; possible scar tissue buildup that is sensitive to sensation; mild edema noted in Lt knee  LOWER EXTREMITY ROM:   ROM Right eval Left eval  Hip flexion  Hip extension    Hip abduction    Hip adduction    Hip internal rotation    Hip external rotation    Knee flexion  A; 134  Knee extension  A: -4 (seated LAQ) P: 0  Ankle dorsiflexion    Ankle plantarflexion    Ankle inversion    Ankle eversion     (Blank rows = not tested)  LOWER EXTREMITY MMT:  MMT Right eval Left eval  Hip flexion    Hip extension    Hip abduction    Hip adduction    Hip internal rotation    Hip external rotation    Knee flexion    Knee extension  3-/5  Ankle dorsiflexion    Ankle plantarflexion    Ankle inversion    Ankle eversion     (Blank rows = not tested)  FUNCTIONAL TESTS:  5 Times Sit to Stand from standard chair (patient did not cross arms at chest and pushed off of legs to stand): 13.78 sec Lt SLS: unable to perform  Rt SLS: 4.6 sec   GAIT: Distance walked: 30 ft Assistive device utilized: Single point cane Level of assistance:  SBA Comments: Patient ambulated with an antalgic gait pattern characterized by decreased Lt stance time, mild limp, flexed trunk posture, slight R lateral lean (decreased weight shift to Lt in standing as well), and rounded shoulders.                                                                                                                                                                        TODAY'S TREATMENT                        DATE: 01/12/25 TherEx:  UBE with bilat LE only, seat 12, for 8 minutes Gastroc stretch using 4 step 2x30s Lt LE only  PT discussed importance of resting in order to decrease amount of soreness   TherAct:  Bilat leg press 3x15 with 150# ; verbal cues required to decrease speed throughout Unilat leg press 2x10 with 75#  PT discussed importance of weight shifting onto the Lt LE, especially following seated exercise or rest   Neuro Re-Ed:  Staggered stance 3x30s each LE back ; SBA provided within // bars  Step over and back using PVC on ground 2x10 with both LEs   TODAY'S TREATMENT                        DATE: 01/09/25 Therapeutic Exercise: HEP instruction/performance c cues for techniques, handout provided.  Trial set performed of each for comprehension and symptom assessment.  See below for exercise list  PATIENT EDUCATION:  Education details:  HEP, POC Person educated: Patient Education method: Explanation, Demonstration, Verbal cues, and Handouts Education comprehension: verbalized understanding, returned demonstration, and verbal cues required  HOME EXERCISE PROGRAM: Access Code: [email or web address] URL: [email or web address] Date: 01/09/2025 Prepared by: Corean Ku Exercises - Seated Active Straight-Leg Raise  - 3 x daily - 7 x weekly - 3 sets - 10 reps - 3 hold  - Sit to Stand with Arms Crossed  - 3 x daily - 7 x weekly - 3 sets - 10 reps  - Standing Lumbar Extension  - 3 x daily - 7 x weekly - 3 sets - 10 reps  - Seated Knee  Extension with Anchored Resistance  - 3 x daily - 7 x weekly - 3 sets - 10 reps    ASSESSMENT:  CLINICAL IMPRESSION: Patient arrived to session noting only having increased soreness/pain when ambulating. Patient tolerated all activities this date, though had high levels of fatigue. PT had to discuss with patient importance of rest as well as movement as he is consistently doing his exercises throughout the day with minimal rest. Patient will continue to benefit from skilled PT.   OBJECTIVE IMPAIRMENTS: Abnormal gait, decreased activity tolerance, decreased balance, decreased coordination, decreased endurance, decreased knowledge of condition, decreased mobility, difficulty walking, decreased strength, and pain.   ACTIVITY LIMITATIONS: carrying, lifting, bending, sitting, standing, squatting, sleeping, stairs, transfers, and locomotion level  PARTICIPATION LIMITATIONS: driving, community activity, and occupation  PERSONAL FACTORS: Past/current experiences, Time since onset of injury/illness/exacerbation, and 1-2 comorbidities: Left TKA revision, Left poly exchange are also affecting patient's functional outcome.   REHAB POTENTIAL: Good  CLINICAL DECISION MAKING: Stable/uncomplicated  EVALUATION COMPLEXITY: Low   GOALS: Goals reviewed with patient? Yes  SHORT TERM GOALS: (target date for Short term goals 02/02/25)   1.  Patient will demonstrate independent use of home exercise program to maintain progress from in clinic treatments.  Goal status: New  LONG TERM GOALS: (target dates for all long term goals 02/20/2025)   1. Patient will demonstrate/report pain at worst less than or equal to 2/10 to facilitate minimal limitation in daily activity secondary to pain symptoms. Goal status: New   2. Patient will demonstrate independent use of home exercise program to facilitate ability to maintain/progress functional gains from skilled physical therapy services. Goal status: New   3.  Patient will demonstrate Patient specific functional scale avg > or = 6 to indicate reduced disability due to condition.  Goal status: New   4.  Patient will demonstrate Rt LE MMT 4/5 throughout to faciltiate usual transfers, stairs, squatting at [facility] for daily life.  Goal status: New   5.  Patient will complete 5xSTS in < or = 12 seconds with arms across chest and supervision to demonstrate improved LE strength and transfer efficiency to support community mobility.  Goal status: New  PLAN:  PT FREQUENCY: 1-2x/week  PT DURATION: 6 weeks  PLANNED INTERVENTIONS: 97164- PT Re-evaluation, 97750- Physical Performance Testing, 97110-Therapeutic exercises, 97530- Therapeutic activity, [id number]- Neuromuscular re-education, 97535- Self Care, [id number]- Manual therapy, [phone]- Gait training, [phone]- Aquatic Therapy, [phone]- Electrical stimulation (unattended), 97016- Vasopneumatic device, 20560 (1-2 muscles), 20561 (3+ muscles)- Dry Needling, Patient/Family education, Balance training, Stair training, Taping, Joint mobilization, Scar mobilization, Cryotherapy, and Moist heat  PLAN FOR NEXT SESSION:  Follow up about HEP. Functional strengthening and balance, working on LLE weight bearing  Next MD visit: tbd  Susannah Daring, PT, DPT 01/25/25 10:41 AM      Referring diagnosis?  M17.12 (ICD-10-CM) - Arthritis of  knee, left  M25.462 (ICD-10-CM) - Effusion of left knee joint  T84.018A,Z96.659 (ICD-10-CM) - Failure of total knee replacement, initial encounter   Treatment diagnosis? (if different than referring diagnosis)  Muscle weakness (generalized) Acute pain of left knee Unsteadiness on feet Pain in left hip Other abnormalities of gait and mobility  What was this (referring dx) caused by? [x]  Surgery []  Fall []  Ongoing issue []  Arthritis []  Other: ____________  Laterality: []  Rt [x]  Lt []  Both  Check all possible CPT codes: *CHOOSE 10 OR LESS* See Planned Interventions listed in the Plan  section of the Evaluation.     "

## 2025-01-16 ENCOUNTER — Encounter: Admitting: Physical Therapy

## 2025-01-17 ENCOUNTER — Encounter: Payer: Self-pay | Admitting: Orthopedic Surgery

## 2025-01-17 ENCOUNTER — Other Ambulatory Visit: Payer: Self-pay | Admitting: Family

## 2025-01-17 MED ORDER — PREDNISONE 10 MG PO TABS: 10.0000 mg | ORAL_TABLET | Freq: Every day | ORAL | 0 refills | Status: AC

## 2025-01-17 NOTE — Therapy (Incomplete)
 " OUTPATIENT PHYSICAL THERAPY LOWER EXTREMITY TREATMENT   Patient Name: Benjamin Romero MRN: [id number] DOB:[birth year], 74 y.o., male Today's Date: 01/17/2025  END OF SESSION:     Past Medical History:  Diagnosis Date   Colon polyps    Diverticulosis    E-coli UTI 11/02/2012   1 month ago- due to diet reducing med used-all clear   Gas    Hypertension    Pancreatitis    ~ 20212, 01/2017 (idiopathic)   Prediabetes    Sleep apnea    denies-no cpap used,dx. 2009   Past Surgical History:  Procedure Laterality Date   BACK SURGERY     lumbar   COLONOSCOPY     EUS  12/31/2011   Procedure: UPPER ENDOSCOPIC ULTRASOUND (EUS) LINEAR;  Surgeon: Toribio Cedar, MD;  Location: [facility];  Service: Endoscopy;  Laterality: N/A;  radial linear   HAND SURGERY Left    repair traum cut left hand   KNEE SURGERY  bil   TOTAL KNEE ARTHROPLASTY  11/03/2012   Procedure: TOTAL KNEE ARTHROPLASTY;  Surgeon: Lonni CINDERELLA Poli, MD;  Location: [facility];  Service: Orthopedics;  Laterality: Left;  Left Total Knee Arthroplasty   TOTAL KNEE REVISION Left 01/05/2014   Procedure: LEFT TOTAL KNEE POLY EXCHANGE;  Surgeon: Lonni CINDERELLA Poli, MD;  Location: [facility];  Service: Orthopedics;  Laterality: Left;   TOTAL KNEE REVISION Left 12/08/2024   Procedure: TOTAL KNEE REVISION;  Surgeon: Harden Jerona GAILS, MD;  Location: [facility] OR;  Service: Orthopedics;  Laterality: Left;  REVISION POLY TRAY LEFT TOTAL KNEE ARTHROPLASTY   Patient Active Problem List   Diagnosis Date Noted   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   Screening for colon cancer 03/28/2024   Elevated uric acid in blood 03/28/2024   Dysthymia 03/28/2024   Prediabetes 03/28/2024   Elevated PSA 03/28/2024   Medication side effect 07/26/2023   Grieving 07/26/2023   History of pancreatitis 07/07/2021   Essential hypertension 07/07/2021   Pain in right knee 05/27/2020   Hyperlipidemia 05/10/2019   BPH (benign prostatic hyperplasia) 02/13/2017    Benign prostatic hyperplasia with urinary frequency 06/07/2014   Urinary tract infection, site not specified    Benign localized hyperplasia of prostate with urinary obstruction and other lower urinary tract symptoms (LUTS)(600.21)    Personal history of tobacco use, presenting hazards to health    Effusion of left knee joint 01/05/2014   Failed total knee, left, initial encounter 01/05/2014   Arthritis of knee, left 11/03/2012   Nonspecific (abnormal) findings on radiological and other examination of gastrointestinal tract 12/31/2011   Gastritis and gastroduodenitis 12/31/2011   Duodenitis 12/31/2011   History of colonic polyps 11/10/2011   RESTLESS LEG SYNDROME 03/01/2008   Obesity, morbid (HCC) 02/09/2008   Acute pancreatitis 01/30/2008    PCP: Berneta Elsie Sayre, MD   REFERRING PROVIDER: Harden Jerona GAILS, MD   REFERRING DIAG:  [phone] (ICD-10-CM) - Arthritis of knee, left  M25.462 (ICD-10-CM) - Effusion of left knee joint  T84.018A,Z96.659 (ICD-10-CM) - Failure of total knee replacement, initial encounter    THERAPY DIAG:  No diagnosis found.  Rationale for Evaluation and Treatment: Rehabilitation  ONSET DATE: Date of surgery 11/03/2012  Date of Revision 12/08/24  SUBJECTIVE:   SUBJECTIVE STATEMENT: *** Patient reports that he is working out day and night because he can't sleep.  Patient reports that his knee only really hurts at a 3/10 when he is walking on it. Patient also reports that his sleep has improved.  PERTINENT HISTORY: Left TKA Revision 12/08/24 Left poly exchange 12/2013 Left TKA 11/03/2012 Left hip pain   Patient states that he has not been able to sleep at night due to pain and discomfort. He is sleeping for about 2 hours at night. He was receiving HHPT and demonstrated exercises he was previously doing at home. He also has a bike at home that he enjoys using for about 15 minutes. He also mentioned shooting pain into the left hip after his Left  TKA revision.   DIAGNOSTIC FINDINGS:  EXAM: CT LEFT KNEE WITHOUT IV CONTRAST 11/10/2024 02:09:11 PM IMPRESSION: 1. Total knee prosthesis without periprosthetic fracture or abnormal lucency. 2. Clustered ossific fragments in the expected location of a Baker cyst, possibly representing osteochondral fragments loose within a surrounding Baker cyst; largest fragment measures 1.8 cm. 3. Small knee joint effusion in the suprapatellar bursa region.  PAIN:  NPRS scale: 3/10 at beginning of session *** Pain location: Anterior knee/patella and up into the Lt hip; some numbness down into bottom of foot Pain description: Numbness and tingling in left foot; Sharp pain in knee Aggravating factors: exercising, standing for long periods of time  Relieving factors: tennis ball under foot, ice, medications, OTC (ibuprofen)   PRECAUTIONS: None  WEIGHT BEARING RESTRICTIONS: No  FALLS:  Has patient fallen in last 6 months? Yes. 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 10 represents able to perform at prior level. 0 1 2 3 4 5 6 7 8 9  10 (Date and Score)  Activity Eval     1. Walking long distances 4     2. Standing long periods of time 5     3.     4.    5.    Score 4.5    Total score = sum of the activity scores/number of activities Minimum detectable change (90%CI) for average  score = 2 points Minimum detectable change (90%CI) for single activity score = 3 points  COGNITION: Overall cognitive status: WFL    SENSATION: WFL  POSTURE:  rounded shoulders and flexed trunk   PALPATION: Tenderness to quad tendon and proximal knee incision; possible scar tissue buildup that is sensitive to sensation; mild edema noted in Lt knee  LOWER EXTREMITY ROM:   ROM Right eval Left eval  Hip flexion    Hip extension    Hip abduction    Hip adduction    Hip internal rotation    Hip external rotation    Knee flexion  A; 134  Knee extension  A: -4 (seated LAQ) P: 0  Ankle dorsiflexion    Ankle plantarflexion    Ankle inversion    Ankle eversion     (Blank rows = not tested)  LOWER EXTREMITY MMT:  MMT Right eval Left eval  Hip flexion    Hip extension    Hip abduction  Hip adduction    Hip internal rotation    Hip external rotation    Knee flexion    Knee extension  3-/5  Ankle dorsiflexion    Ankle plantarflexion    Ankle inversion    Ankle eversion     (Blank rows = not tested)  FUNCTIONAL TESTS:  5 Times Sit to Stand from standard chair (patient did not cross arms at chest and pushed off of legs to stand): 13.78 sec Lt SLS: unable to perform  Rt SLS: 4.6 sec   GAIT: Distance walked: 30 ft Assistive device utilized: Single point cane Level of assistance: SBA Comments: Patient ambulated with an antalgic gait pattern characterized by decreased Lt stance time, mild limp, flexed trunk posture, slight R lateral lean (decreased weight shift to Lt in standing as well), and rounded shoulders.                                                                                                                                                                        TODAY'S TREATMENT                        DATE: 01/18/25 *** TherEx  TherAct  Neuro Re-ed   TREATMENT                        DATE: 01/12/25 TherEx:  UBE with bilat LE only, seat 12, for 8  minutes Gastroc stretch using 4 step 2x30s Lt LE only  PT discussed importance of resting in order to decrease amount of soreness   TherAct:  Bilat leg press 3x15 with 150# ; verbal cues required to decrease speed throughout Unilat leg press 2x10 with 75#  PT discussed importance of weight shifting onto the Lt LE, especially following seated exercise or rest   Neuro Re-Ed:  Staggered stance 3x30s each LE back ; SBA provided within // bars  Step over and back using PVC on ground 2x10 with both LEs   TODAY'S TREATMENT                        DATE: 01/09/25 Therapeutic Exercise: HEP instruction/performance c cues for techniques, handout provided.  Trial set performed of each for comprehension and symptom assessment.  See below for exercise list  PATIENT EDUCATION:  Education details: HEP, POC Person educated: Patient Education method: Explanation, Demonstration, Verbal cues, and Handouts Education comprehension: verbalized understanding, returned demonstration, and verbal cues required  HOME EXERCISE PROGRAM: Access Code: [email or web address] URL: [email or web address] Date: 01/09/2025 Prepared by: Corean Ku Exercises - Seated Active Straight-Leg Raise  - 3 x daily - 7 x weekly - 3 sets - 10 reps -  3 hold  - Sit to Stand with Arms Crossed  - 3 x daily - 7 x weekly - 3 sets - 10 reps  - Standing Lumbar Extension  - 3 x daily - 7 x weekly - 3 sets - 10 reps  - Seated Knee Extension with Anchored Resistance  - 3 x daily - 7 x weekly - 3 sets - 10 reps    ASSESSMENT:  CLINICAL IMPRESSION: *** Patient arrived to session noting only having increased soreness/pain when ambulating. Patient tolerated all activities this date, though had high levels of fatigue. PT had to discuss with patient importance of rest as well as movement as he is consistently doing his exercises throughout the day with minimal rest. Patient will continue to benefit from skilled PT.   OBJECTIVE  IMPAIRMENTS: Abnormal gait, decreased activity tolerance, decreased balance, decreased coordination, decreased endurance, decreased knowledge of condition, decreased mobility, difficulty walking, decreased strength, and pain.   ACTIVITY LIMITATIONS: carrying, lifting, bending, sitting, standing, squatting, sleeping, stairs, transfers, and locomotion level  PARTICIPATION LIMITATIONS: driving, community activity, and occupation  PERSONAL FACTORS: Past/current experiences, Time since onset of injury/illness/exacerbation, and 1-2 comorbidities: Left TKA revision, Left poly exchange are also affecting patient's functional outcome.   REHAB POTENTIAL: Good  CLINICAL DECISION MAKING: Stable/uncomplicated  EVALUATION COMPLEXITY: Low   GOALS: Goals reviewed with patient? Yes  SHORT TERM GOALS: (target date for Short term goals 02/02/25)   1.  Patient will demonstrate independent use of home exercise program to maintain progress from in clinic treatments.  Goal status: New  LONG TERM GOALS: (target dates for all long term goals 02/20/2025)   1. Patient will demonstrate/report pain at worst less than or equal to 2/10 to facilitate minimal limitation in daily activity secondary to pain symptoms. Goal status: New   2. Patient will demonstrate independent use of home exercise program to facilitate ability to maintain/progress functional gains from skilled physical therapy services. Goal status: New   3. Patient will demonstrate Patient specific functional scale avg > or = 6 to indicate reduced disability due to condition.  Goal status: New   4.  Patient will demonstrate Rt LE MMT 4/5 throughout to faciltiate usual transfers, stairs, squatting at [facility] for daily life.  Goal status: New   5.  Patient will complete 5xSTS in < or = 12 seconds with arms across chest and supervision to demonstrate improved LE strength and transfer efficiency to support community mobility.  Goal status:  New  PLAN:  PT FREQUENCY: 1-2x/week  PT DURATION: 6 weeks  PLANNED INTERVENTIONS: 97164- PT Re-evaluation, 97750- Physical Performance Testing, 97110-Therapeutic exercises, 97530- Therapeutic activity, [id number]- Neuromuscular re-education, 97535- Self Care, [id number]- Manual therapy, [phone]- Gait training, [phone]- Aquatic Therapy, [phone]- Electrical stimulation (unattended), 97016- Vasopneumatic device, 20560 (1-2 muscles), 20561 (3+ muscles)- Dry Needling, Patient/Family education, Balance training, Stair training, Taping, Joint mobilization, Scar mobilization, Cryotherapy, and Moist heat  PLAN FOR NEXT SESSION: *** Follow up about HEP. Functional strengthening and balance, working on LLE weight bearing  Next MD visit: tbd  SIGN***   Referring diagnosis?  M17.12 (ICD-10-CM) - Arthritis of knee, left  M25.462 (ICD-10-CM) - Effusion of left knee joint  T84.018A,Z96.659 (ICD-10-CM) - Failure of total knee replacement, initial encounter   Treatment diagnosis? (if different than referring diagnosis)  Muscle weakness (generalized) Acute pain of left knee Unsteadiness on feet Pain in left hip Other abnormalities of gait and mobility  What was this (referring dx) caused by? [x]  Surgery []  Fall []  Ongoing issue []   Arthritis []  Other: ____________  Laterality: []  Rt [x]  Lt []  Both  Check all possible CPT codes: *CHOOSE 10 OR LESS* See Planned Interventions listed in the Plan section of the Evaluation.     "

## 2025-01-18 ENCOUNTER — Encounter: Admitting: Physical Therapy

## 2025-01-18 ENCOUNTER — Telehealth: Payer: Self-pay | Admitting: Physical Therapy

## 2025-01-18 NOTE — Telephone Encounter (Signed)
 Patient missed PT appointment 1/29 at 845.  SPT called and pt reported he had his days mixed up.  He plans to attend next PT appt.

## 2025-01-23 ENCOUNTER — Encounter: Admitting: Physical Therapy

## 2025-01-25 ENCOUNTER — Encounter: Admitting: Rehabilitative and Restorative Service Providers"

## 2025-01-25 ENCOUNTER — Telehealth: Payer: Self-pay | Admitting: Rehabilitative and Restorative Service Providers"

## 2025-01-25 NOTE — Telephone Encounter (Signed)
 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  He reports doing a good job with his HEP and wants to continue independently.  He was given the option of returning to supervised PT if requested.  He requests DC for now.

## 2025-01-26 ENCOUNTER — Other Ambulatory Visit: Payer: Self-pay | Admitting: Family

## 2025-01-26 MED ORDER — METHOCARBAMOL 500 MG PO TABS: 500.0000 mg | ORAL_TABLET | Freq: Four times a day (QID) | ORAL | 0 refills | Status: AC | PRN

## 2025-01-27 ENCOUNTER — Other Ambulatory Visit

## 2025-01-30 ENCOUNTER — Encounter: Admitting: Physical Therapy

## 2025-02-01 ENCOUNTER — Encounter: Admitting: Physical Therapy
